# Patient Record
Sex: Male | Born: 1980
Health system: Southern US, Community
[De-identification: ages and names within clinical notes are randomized; demographics above are authoritative.]

## PROBLEM LIST (undated history)

## (undated) DIAGNOSIS — G4733 Obstructive sleep apnea (adult) (pediatric): Secondary | ICD-10-CM

## (undated) DIAGNOSIS — M1990 Unspecified osteoarthritis, unspecified site: Secondary | ICD-10-CM

## (undated) DIAGNOSIS — J302 Other seasonal allergic rhinitis: Secondary | ICD-10-CM

## (undated) DIAGNOSIS — K760 Fatty (change of) liver, not elsewhere classified: Secondary | ICD-10-CM

## (undated) DIAGNOSIS — M4310 Spondylolisthesis, site unspecified: Secondary | ICD-10-CM

## (undated) DIAGNOSIS — E739 Lactose intolerance, unspecified: Secondary | ICD-10-CM

## (undated) DIAGNOSIS — K219 Gastro-esophageal reflux disease without esophagitis: Secondary | ICD-10-CM

## (undated) DIAGNOSIS — M109 Gout, unspecified: Secondary | ICD-10-CM

## (undated) DIAGNOSIS — M2550 Pain in unspecified joint: Secondary | ICD-10-CM

## (undated) DIAGNOSIS — N529 Male erectile dysfunction, unspecified: Secondary | ICD-10-CM

## (undated) DIAGNOSIS — R112 Nausea with vomiting, unspecified: Secondary | ICD-10-CM

## (undated) DIAGNOSIS — Z9889 Other specified postprocedural states: Secondary | ICD-10-CM

## (undated) DIAGNOSIS — I10 Essential (primary) hypertension: Secondary | ICD-10-CM

## (undated) DIAGNOSIS — G4730 Sleep apnea, unspecified: Secondary | ICD-10-CM

## (undated) DIAGNOSIS — F909 Attention-deficit hyperactivity disorder, unspecified type: Secondary | ICD-10-CM

## (undated) DIAGNOSIS — F419 Anxiety disorder, unspecified: Secondary | ICD-10-CM

## (undated) DIAGNOSIS — M549 Dorsalgia, unspecified: Secondary | ICD-10-CM

## (undated) DIAGNOSIS — T8859XA Other complications of anesthesia, initial encounter: Secondary | ICD-10-CM

## (undated) HISTORY — DX: Essential (primary) hypertension: I10

## (undated) HISTORY — DX: Other seasonal allergic rhinitis: J30.2

## (undated) HISTORY — DX: Dorsalgia, unspecified: M54.9

## (undated) HISTORY — PX: WISDOM TOOTH EXTRACTION: SHX21

## (undated) HISTORY — DX: Fatty (change of) liver, not elsewhere classified: K76.0

## (undated) HISTORY — DX: Gout, unspecified: M10.9

## (undated) HISTORY — DX: Pain in unspecified joint: M25.50

## (undated) HISTORY — DX: Gastro-esophageal reflux disease without esophagitis: K21.9

## (undated) HISTORY — DX: Lactose intolerance, unspecified: E73.9

## (undated) HISTORY — PX: OTHER SURGICAL HISTORY: SHX169

## (undated) HISTORY — PX: KNEE SURGERY: SHX244

## (undated) HISTORY — PX: COLONOSCOPY: SHX174

## (undated) HISTORY — PX: ELBOW SURGERY: SHX618

## (undated) HISTORY — PX: VASECTOMY: SHX75

## (undated) HISTORY — DX: Obstructive sleep apnea (adult) (pediatric): G47.33

## (undated) HISTORY — DX: Attention-deficit hyperactivity disorder, unspecified type: F90.9

## (undated) HISTORY — DX: Sleep apnea, unspecified: G47.30

---

## 2013-06-09 ENCOUNTER — Ambulatory Visit (HOSPITAL_BASED_OUTPATIENT_CLINIC_OR_DEPARTMENT_OTHER): Payer: 59 | Attending: Family Medicine | Admitting: Radiology

## 2013-06-09 VITALS — Ht 72.0 in | Wt 220.0 lb

## 2013-06-09 DIAGNOSIS — G4710 Hypersomnia, unspecified: Secondary | ICD-10-CM | POA: Insufficient documentation

## 2013-06-09 DIAGNOSIS — G4733 Obstructive sleep apnea (adult) (pediatric): Secondary | ICD-10-CM

## 2013-06-09 DIAGNOSIS — G4730 Sleep apnea, unspecified: Principal | ICD-10-CM

## 2013-06-09 DIAGNOSIS — R0683 Snoring: Secondary | ICD-10-CM

## 2013-06-09 DIAGNOSIS — R5381 Other malaise: Secondary | ICD-10-CM

## 2013-06-09 DIAGNOSIS — R5383 Other fatigue: Secondary | ICD-10-CM

## 2013-06-14 DIAGNOSIS — R0989 Other specified symptoms and signs involving the circulatory and respiratory systems: Secondary | ICD-10-CM

## 2013-06-14 DIAGNOSIS — R5383 Other fatigue: Secondary | ICD-10-CM

## 2013-06-14 DIAGNOSIS — G4733 Obstructive sleep apnea (adult) (pediatric): Secondary | ICD-10-CM

## 2013-06-14 DIAGNOSIS — R0609 Other forms of dyspnea: Secondary | ICD-10-CM

## 2013-06-14 DIAGNOSIS — R5381 Other malaise: Secondary | ICD-10-CM

## 2013-06-14 NOTE — Sleep Study (Signed)
   NAME: Andrew Gray DATE OF BIRTH:  1980/10/28 MEDICAL RECORD NUMBER [id number]  LOCATION: [facility]  PHYSICIAN: YOUNG,CLINTON D  DATE OF STUDY: 06/09/2013  SLEEP STUDY TYPE: Nocturnal Polysomnogram               REFERRING PHYSICIAN: Donnie Coffin, MD  INDICATION FOR STUDY: Hypersomnia with sleep apnea  EPWORTH SLEEPINESS SCORE:   9/24 HEIGHT: 6' (182.9 cm)  WEIGHT: 220 lb (99.791 kg)    Body mass index is 29.83 kg/(m^2).  NECK SIZE: 18.5 in.  MEDICATIONS: Charted for review  SLEEP ARCHITECTURE: Total sleep time 333 minutes with sleep efficiency 78.3%, stage I was 10.8%, stage II 66.1%, stage III 0.5%, REM 22.7% of total sleep time. Sleep latency 14.5 minutes, REM latency 135.5 minutes, awake after sleep onset 66.5 minutes, arousal index 9.9. Bedtime medication: None. There was difficulty initiating and maintaining sleep.  RESPIRATORY DATA: Apnea hypopnea index (AHI) 4.3 per hour. 24 events were scored including 2 central apneas and 22 hypopneas. Events were more common while supine. REM AHI 8.7 per hour. There were not enough early events to meet protocol requirements for split CPAP titration.   OXYGEN DATA: Loud intermittent snoring with oxygen desaturation to a nadir of 89% and mean oxygen saturation through the study of 96.9% on room air.  CARDIAC DATA: Normal cardiac rhythm  MOVEMENT/PARASOMNIA: No significant movement disturbance. Bathroom x1  IMPRESSION/ RECOMMENDATION:   1) Nonspecific difficulty initiating and maintaining sleep until nearly 00:30 AM, without bedtime medication. 2) Occasional respiratory events with sleep disturbance, within normal limits. AHI 4.3 per hour (the normal range for adults is an AHI of 0-5 events per hour). Loud snoring with oxygen desaturation to a nadir at 89% and mean oxygen saturation through the study of 96.9% on room air.  Signed Baird Lyons M.D. Deneise Lever Diplomate, American Board of Sleep  Medicine  ELECTRONICALLY SIGNED ON:  06/14/2013, 2:40 PM [place] PH: [phone]   FX: [phone] [place]

## 2015-02-15 ENCOUNTER — Other Ambulatory Visit: Payer: Self-pay | Admitting: Sports Medicine

## 2015-02-15 DIAGNOSIS — M545 Low back pain, unspecified: Secondary | ICD-10-CM

## 2015-02-15 DIAGNOSIS — M4317 Spondylolisthesis, lumbosacral region: Secondary | ICD-10-CM | POA: Insufficient documentation

## 2015-02-16 ENCOUNTER — Other Ambulatory Visit: Payer: Self-pay | Admitting: Sports Medicine

## 2015-02-16 ENCOUNTER — Ambulatory Visit (INDEPENDENT_AMBULATORY_CARE_PROVIDER_SITE_OTHER): Payer: 59

## 2015-02-16 DIAGNOSIS — M545 Low back pain, unspecified: Secondary | ICD-10-CM

## 2015-02-16 DIAGNOSIS — M5136 Other intervertebral disc degeneration, lumbar region: Secondary | ICD-10-CM

## 2015-02-16 DIAGNOSIS — M109 Gout, unspecified: Secondary | ICD-10-CM | POA: Insufficient documentation

## 2015-02-16 DIAGNOSIS — M4317 Spondylolisthesis, lumbosacral region: Secondary | ICD-10-CM

## 2015-02-16 DIAGNOSIS — M10072 Idiopathic gout, left ankle and foot: Secondary | ICD-10-CM

## 2015-02-16 DIAGNOSIS — Z0000 Encounter for general adult medical examination without abnormal findings: Secondary | ICD-10-CM | POA: Insufficient documentation

## 2015-02-16 NOTE — Assessment & Plan Note (Signed)
X-ray show L5-S1 grade 1 spondylolisthesis with the appearance of a pars intra-articular defect, unilateral. Abdomen has been through greater than 6 weeks of physician directed rehabilitation, as well as oral medications. Considering right-sided L5 versus S1 radiculopathy we are going to obtain an MRI, he does have some lower thoracic degenerative disc disease somewhat would like to extend the L-spine MRI up to T10.

## 2015-02-17 ENCOUNTER — Encounter: Payer: Self-pay | Admitting: Sports Medicine

## 2015-02-18 ENCOUNTER — Ambulatory Visit (INDEPENDENT_AMBULATORY_CARE_PROVIDER_SITE_OTHER): Payer: 59 | Admitting: Physical Therapy

## 2015-02-18 ENCOUNTER — Encounter: Payer: Self-pay | Admitting: Physical Therapy

## 2015-02-18 DIAGNOSIS — M545 Low back pain, unspecified: Secondary | ICD-10-CM

## 2015-02-18 DIAGNOSIS — M6281 Muscle weakness (generalized): Secondary | ICD-10-CM

## 2015-02-18 DIAGNOSIS — R29898 Other symptoms and signs involving the musculoskeletal system: Secondary | ICD-10-CM

## 2015-02-18 NOTE — Patient Instructions (Addendum)
Pelvic Press   Place hands under belly between navel and pubic bone, palms up. Feel pressure on hands. Increase pressure on hands by pressing pelvis down. This is NOT a pelvic tilt. Hold __5_ seconds. Relax. Repeat _10__ times. Once a day  KNEE: Flexion - Prone   Hold pelvic press. Bend both knees. Raise heels toward buttocks. Do not raise hips. _5-10__ reps per set, __1_ sets per day, _7__ days per week  Hip Extension (Prone) - DON:T USE PILLOWS UNDER BELLY   Hold pelvic press  Lift left leg _3___ inches from floor, keeping knee locked. Repeat __5-10__ times per set. Do _1___ sets per session. Do _1___ sessions per day.  HIP: Extension / KNEE: Flexion - Prone    Hold pelvic press. Bend knee, raise leg up  10___ reps per set, _1__ sets per day, _7__ days per week  Axial Extension- Upper body sequence * always start with pelvic press    Lie on stomach with forehead resting on floor and arms at sides. Tuck chin in and raise head from floor without bending it up or down. Repeat __5- 10_ times per set. Do __1__ sets per session. Do _1___ sessions per day.  Progression:  Arms at side Arms in T shape Arms in W shape  Arms in M shape Arms in Y shape  Then perform bilat knees to chest for  30 sec or LTR.   [employer] [facility] [place], [place] [address]  [phone] (office) [phone] (fax)  Trigger Point Dry Needling  . What is Trigger Point Dry Needling (DN)? o DN is a physical therapy technique used to treat muscle pain and dysfunction. Specifically, DN helps deactivate muscle trigger points (muscle knots).  o A thin filiform needle is used to penetrate the skin and stimulate the underlying trigger point. The goal is for a local twitch response (LTR) to occur and for the trigger point to relax. No medication of any kind is injected during the procedure.   . What Does Trigger Point Dry Needling Feel Like?   o The procedure feels different for each individual patient. Some patients report that they do not actually feel the needle enter the skin and overall the process is not painful. Very mild bleeding may occur. However, many patients feel a deep cramping in the muscle in which the needle was inserted. This is the local twitch response.   Marland Kitchen How Will I feel after the treatment? o Soreness is normal, and the onset of soreness may not occur for a few hours. Typically this soreness does not last longer than two days.  o Bruising is uncommon, however; ice can be used to decrease any possible bruising.  o In rare cases feeling tired or nauseous after the treatment is normal. In addition, your symptoms may get worse before they get better, this period will typically not last longer than 24 hours.   . What Can I do After My Treatment? o Increase your hydration by drinking more water for the next 24 hours. o You may place ice or heat on the areas treated that have become sore, however, do not use heat on inflamed or bruised areas. Heat often brings more relief post needling. o You can continue your regular activities, but vigorous activity is not recommended initially after the treatment for 24 hours. o DN is best combined with other physical therapy such as strengthening, stretching, and other therapies.  o

## 2015-02-18 NOTE — Therapy (Addendum)
[place], [name], [address] Phone: [phone]   Fax:  [phone]  Physical Therapy Evaluation  Patient Details  Name: Andrew Gray MRN: [id number] Date of Birth: Sep 19, 1980 Referring Provider: Tandy Gaw  Encounter Date: 02/18/2015      PT End of Session - 02/18/15 1228    Visit Number 1   Number of Visits 4   Date for PT Re-Evaluation 03/18/15   PT Start Time 7169   PT Stop Time 1226   PT Time Calculation (min) 38 min   Activity Tolerance Patient tolerated treatment well      History reviewed. No pertinent past medical history.  History reviewed. No pertinent past surgical history.  There were no vitals filed for this visit.  Visit Diagnosis:  Weakness of back - Plan: PT plan of care cert/re-cert  Right-sided low back pain without sciatica - Plan: PT plan of care cert/re-cert      Subjective Assessment - 02/18/15 1152    Subjective Pt reports increasing low back pain 4-6 wks ago that is beginning to interfere with his walking for exercise, also having trouble with stairs   Diagnostic tests x-rays Pars defect and grade I spondolythesis   Patient Stated Goals walking without pain, mountain biking, running.    Currently in Pain? Yes   Pain Score 3   at worst 8/10    Pain Location Back   Pain Orientation Right   Pain Descriptors / Indicators Aching   Pain Radiating Towards Rt calf laterally    Pain Onset More than a month ago   Pain Frequency Intermittent   Aggravating Factors  prolonged walking, down hills, stairs   Pain Relieving Factors forward flexion            [facility] PT Assessment - 02/18/15 0001    Assessment   Medical Diagnosis spondylolisthesis L5-S1   Referring Provider Thekkekandam   Onset Date/Surgical Date 01/21/15   Prior Therapy none   Precautions   Precautions --  limit spinal extension   Balance Screen   Has the patient fallen in the past 6 months  No   Has the patient had a decrease in activity level because of a fear of falling?  No   Is the patient reluctant to leave their home because of a fear of falling?  No   Prior Function   Level of Independence Independent   Vocation Requirements MD   Posture/Postural Control   Posture/Postural Control Postural limitations   Postural Limitations Forward head;Rounded Shoulders   ROM / Strength   AROM / PROM / Strength AROM;Strength   AROM   Overall AROM Comments bilat LE's WNL   AROM Assessment Site Lumbar   Lumbar Flexion WNL   Lumbar Extension NA   Lumbar - Right Rotation WNL  some pain in back   Lumbar - Left Rotation WNL   Strength   Overall Strength Comments bilat LE's grossly WNL, Rt hip abduction 5-/5, multifidis Rt fair, Lt good (-)    Flexibility   Soft Tissue Assessment /Muscle Length --  Good hamstring flexibility   Palpation   Spinal mobility hypomobile L5-3 Rt UPA with some tenderness.    Palpation comment very tight in Rt lumbar paraspinals/multifidi                    [facility] Adult PT Treatment/Exercise - 02/18/15 0001    Exercises   Exercises Lumbar   Lumbar Exercises: Stretches  Double Knee to Chest Stretch 1 rep;20 seconds   Lower Trunk Rotation 5 reps   Lumbar Exercises: Prone   Other Prone Lumbar Exercises pelvic press routine          Trigger Point Dry Needling - 02/18/15 1246    Consent Given? Yes   Education Handout Provided Yes   Muscles Treated Lower Body --  lumbar 3-1, Rt multifidi, good twitch response.               PT Education - 02/18/15 1250    Education provided Yes   Education Details HEP    Person(s) Educated Patient   Methods Explanation;Demonstration;Handout   Comprehension Verbal cues required;Returned demonstration;Verbalized understanding             PT Long Term Goals - 02/18/15 1251    PT LONG TERM GOAL #1   Title I with HEP ( 03/18/15)    Time 4   Period Weeks   Status New   PT LONG TERM  GOAL #2   Title demo strong contraction bilat multifidi ( 03/18/15)   Time 4   Period Weeks   Status New   PT LONG TERM GOAL #3   Title report pain decrease =/> 75% with walking and on hills ( 03/18/15)    Time 4   Period Weeks   Status New        34 yo male presents with significant tightness in his lumbar paraspinals. Responded well to TDN with decreased Tightness and pain.  He also has deep core weakness and would benefit from PT to decrease pain, increase core Strength and return him to his prior level of function.  Plan - STW to lumbar paraspinals, review HEP and progress to work on [employer].  Jeral Pinch, PT 02/24/2015 2:05 PM           Problem List Patient Active Problem List   Diagnosis Date Noted  . Annual physical exam 02/16/2015  . Gout 02/16/2015  . Spondylolisthesis at L5-S1 level 02/15/2015    Jeral Pinch PT 02/18/2015, 4:47 PM  [facility] [place], [name], [address] Phone: [phone]   Fax:  [phone]  Name: Andrew Gray MRN: [id number] Date of Birth: 10/28/1980

## 2015-02-21 ENCOUNTER — Ambulatory Visit (INDEPENDENT_AMBULATORY_CARE_PROVIDER_SITE_OTHER): Payer: 59

## 2015-02-21 DIAGNOSIS — M4317 Spondylolisthesis, lumbosacral region: Secondary | ICD-10-CM

## 2015-02-21 DIAGNOSIS — M5126 Other intervertebral disc displacement, lumbar region: Secondary | ICD-10-CM | POA: Diagnosis not present

## 2015-02-23 ENCOUNTER — Ambulatory Visit (INDEPENDENT_AMBULATORY_CARE_PROVIDER_SITE_OTHER): Payer: 59 | Admitting: Sports Medicine

## 2015-02-23 VITALS — BP 145/94 | HR 74 | Wt 277.0 lb

## 2015-02-23 DIAGNOSIS — E785 Hyperlipidemia, unspecified: Secondary | ICD-10-CM

## 2015-02-23 DIAGNOSIS — Z0000 Encounter for general adult medical examination without abnormal findings: Secondary | ICD-10-CM

## 2015-02-23 DIAGNOSIS — I10 Essential (primary) hypertension: Secondary | ICD-10-CM

## 2015-02-23 DIAGNOSIS — R740 Nonspecific elevation of levels of transaminase and lactic acid dehydrogenase [LDH]: Secondary | ICD-10-CM | POA: Diagnosis not present

## 2015-02-23 DIAGNOSIS — R7401 Elevation of levels of liver transaminase levels: Secondary | ICD-10-CM | POA: Insufficient documentation

## 2015-02-23 DIAGNOSIS — R635 Abnormal weight gain: Secondary | ICD-10-CM | POA: Diagnosis not present

## 2015-02-23 LAB — COMPREHENSIVE METABOLIC PANEL
AST: 51 U/L — ABNORMAL HIGH (ref 10–40)
BUN: 15 mg/dL (ref 7–25)
CO2: 27 mmol/L (ref 20–31)
Chloride: 101 mmol/L (ref 98–110)
Creat: 0.75 mg/dL (ref 0.60–1.35)
Glucose, Bld: 90 mg/dL (ref 65–99)
Sodium: 134 mmol/L — ABNORMAL LOW (ref 135–146)
Total Bilirubin: 0.8 mg/dL (ref 0.2–1.2)

## 2015-02-23 LAB — COMPREHENSIVE METABOLIC PANEL WITH GFR
ALT: 100 U/L — ABNORMAL HIGH (ref 9–46)
Albumin: 4.7 g/dL (ref 3.6–5.1)
Alkaline Phosphatase: 50 U/L (ref 40–115)
Calcium: 9.2 mg/dL (ref 8.6–10.3)
Potassium: 4.4 mmol/L (ref 3.5–5.3)
Total Protein: 7.4 g/dL (ref 6.1–8.1)

## 2015-02-23 LAB — CBC
HCT: 43.8 % (ref 39.0–52.0)
Hemoglobin: 15.5 g/dL (ref 13.0–17.0)
MCH: 29.9 pg (ref 26.0–34.0)
MCHC: 35.4 g/dL (ref 30.0–36.0)
MCV: 84.4 fL (ref 78.0–100.0)
MPV: 10.8 fL (ref 8.6–12.4)
Platelets: 219 10*3/uL (ref 150–400)
RBC: 5.19 MIL/uL (ref 4.22–5.81)
RDW: 13.8 % (ref 11.5–15.5)
WBC: 4.8 10*3/uL (ref 4.0–10.5)

## 2015-02-23 LAB — LIPID PANEL
Cholesterol: 204 mg/dL — ABNORMAL HIGH (ref 125–200)
HDL: 40 mg/dL (ref >=40)
LDL Cholesterol: 131 mg/dL — ABNORMAL HIGH (ref <130)
Total CHOL/HDL Ratio: 5.1 Ratio — ABNORMAL HIGH (ref <=5.0)
Triglycerides: 165 mg/dL — ABNORMAL HIGH (ref <150)
VLDL: 33 mg/dL — ABNORMAL HIGH (ref <30)

## 2015-02-23 LAB — VITAMIN D 25 HYDROXY (VIT D DEFICIENCY, FRACTURES): Vit D, 25-Hydroxy: 25 ng/mL — ABNORMAL LOW (ref 30–100)

## 2015-02-23 LAB — HEMOGLOBIN A1C
Hgb A1c MFr Bld: 4.9 % (ref <5.7)
Mean Plasma Glucose: 94 mg/dL (ref <117)

## 2015-02-23 LAB — URIC ACID: Uric Acid, Serum: 6.2 mg/dL (ref 4.0–7.8)

## 2015-02-23 LAB — TSH: TSH: 1.482 u[IU]/mL (ref 0.350–4.500)

## 2015-02-23 MED ORDER — PHENTERMINE HCL 37.5 MG PO TABS: ORAL_TABLET | ORAL | Status: DC

## 2015-02-23 MED ORDER — VITAMIN D (ERGOCALCIFEROL) 1.25 MG (50000 UNIT) PO CAPS: 50000.0000 [IU] | ORAL_CAPSULE | ORAL | Status: DC

## 2015-02-23 MED ORDER — LISINOPRIL 20 MG PO TABS: 20.0000 mg | ORAL_TABLET | Freq: Every day | ORAL | Status: DC

## 2015-02-23 NOTE — Addendum Note (Signed)
Addended by: Silverio Decamp on: 02/23/2015 04:19 PM   Modules accepted: Orders

## 2015-02-23 NOTE — Assessment & Plan Note (Signed)
Suspect that this is secondary to steatohepatitis, ordering ultrasound and we will follow this up in a couple of months.

## 2015-02-23 NOTE — Assessment & Plan Note (Signed)
Increasing lisinopril 20 mg daily.  Recheck in 10 days with a BMET

## 2015-02-23 NOTE — Assessment & Plan Note (Signed)
We will first work with aggressive weight loss before considering statin medication.

## 2015-02-23 NOTE — Progress Notes (Signed)
   Subjective:    Patient ID: Andrew Gray, male    DOB: June 22, 1980, 34 y.o.   MRN: [id number]  HPI  Patient is here today for blood pressure and weight check. Denies trouble sleeping, palpitations, or medication problems.   Review of Systems     Objective:   Physical Exam        Assessment & Plan:  This is patients initial visit for abnormal weight gain. Patient advised to schedule a follow up nurse visit in 30 days.

## 2015-02-23 NOTE — Assessment & Plan Note (Signed)
Starting phentermine, return monthly for weight checks and refills. 

## 2015-02-23 NOTE — Addendum Note (Signed)
Addended by: Silverio Decamp on: 02/23/2015 04:24 PM   Modules accepted: Orders

## 2015-02-23 NOTE — Assessment & Plan Note (Signed)
Dr. Georgina Snell will ultimately follow up for a new patient physical/wellness visit.

## 2015-02-25 ENCOUNTER — Ambulatory Visit (INDEPENDENT_AMBULATORY_CARE_PROVIDER_SITE_OTHER): Payer: 59 | Admitting: Physical Therapy

## 2015-02-25 DIAGNOSIS — R29898 Other symptoms and signs involving the musculoskeletal system: Secondary | ICD-10-CM

## 2015-02-25 DIAGNOSIS — M545 Low back pain, unspecified: Secondary | ICD-10-CM

## 2015-02-25 DIAGNOSIS — M6281 Muscle weakness (generalized): Secondary | ICD-10-CM

## 2015-02-25 NOTE — Patient Instructions (Signed)
  Abdominal Bracing With Pelvic Floor (Hook-Lying)   With neutral spine, tighten pelvic floor and abdominals. Hold 10 seconds. Repeat __10_ times. Do _1__ times a day.   Knee to Chest: Transverse Plane Stability  Tighten abdominals.  Bring one knee up, then return. Be sure pelvis does not roll side to side. Keep pelvis still. Lift knee __10_ times each leg. Restabilize pelvis. Repeat with other leg. Do _1-2__ sets, _1__ times per day.   Hip External Rotation With Pillow: Transverse Plane Stability   KEEP BOTH KNEES BENT.  Slowly roll bent knee out. Be sure pelvis does not rotate. Do _10__ times. Restabilize pelvis. Repeat with other leg. Do _1-2__ sets, _1__ times per day.  Heel Slide: 4-10 Inches - Transverse Plane Stability  Tighten abdominals.  Slide heel 4 inches down. Be sure pelvis does not rotate. Do _10__ times. Restabilize pelvis. Repeat with other leg. Do __1_ sets, _1__ times per day.   [facility] [place], [place] [address]  [phone] (office) [phone] (fax)

## 2015-02-25 NOTE — Therapy (Signed)
[place], [name], [address] Phone: [phone]   Fax:  [phone]  Physical Therapy Treatment  Patient Details  Name: Andrew Gray MRN: [id number] Date of Birth: 01/03/1981 Referring Provider: Tandy Gaw  Encounter Date: 02/25/2015      PT End of Session - 02/25/15 1332    Visit Number 2   Number of Visits 4   Date for PT Re-Evaluation 03/18/15   PT Start Time 1330   PT Stop Time 1432   PT Time Calculation (min) 62 min      No past medical history on file.  No past surgical history on file.  There were no vitals filed for this visit.  Visit Diagnosis:  Weakness of back  Right-sided low back pain without sciatica      Subjective Assessment - 02/25/15 1437    Subjective Pt reports he did not notice a big difference in LB after dry needling session.  Continues with Rt LBP with SLS while donning clothes in morning, as well as walking up/down hills.  Has been performing HEP daily.    Diagnostic tests x-rays Pars defect and grade I spondolythesis   Currently in Pain? Yes   Pain Score 3   up to 7 with certain motions   Pain Location Back   Pain Orientation Right   Aggravating Factors  prolonged walking, down hills, stairs   Pain Relieving Factors forward flexion             [facility] PT Assessment - 02/25/15 0001    Assessment   Medical Diagnosis spondylolisthesis L5-S1           [facility] Adult PT Treatment/Exercise - 02/25/15 0001    Exercises   Exercises Lumbar   Lumbar Exercises: Stretches   Passive Hamstring Stretch 1 rep;60 seconds   Double Knee to Chest Stretch 20 seconds;2 reps   Lower Trunk Rotation 5 reps  2 sets   Piriformis Stretch 2 reps;30 seconds  each leg   Lumbar Exercises: Standing   Other Standing Lumbar Exercises Anti-rotation arms out and in from core using green / blue band x 3 reps.  Stopped due pain at injury site.    Lumbar Exercises: Supine   Ab  Set 5 reps  10 sec   Clam 10 reps  with ab set, each leg   Heel Slides 10 reps  ab set, each leg   Heel Slides Limitations some pain initially  with knee extension on RLE, encouraged to stay within pain free zone.    Bent Knee Raise 20 reps  each leg, with ab set    Other Supine Lumbar Exercises Table top with heel taps x 8 reps (difficult)   Lumbar Exercises: Prone   Other Prone Lumbar Exercises Pelvic press x 5 sec hold x 10 reps;  then with unilateral knee flexion x 10 reps x 2 sets; then with 10 reps of hip ext. Required some VC for form.    Lumbar Exercises: Quadruped   Other Quadruped Lumbar Exercises High kneeling: ball roll out core exercise with red ball x 8 reps (challenging)     Modalities   Modalities Moist Heat;Electrical Stimulation   Moist Heat Therapy   Number Minutes Moist Heat 15 Minutes   Moist Heat Location Lumbar Spine   Electrical Stimulation   Electrical Stimulation Location Rt lumbar paraspinals/ SI joint   Electrical Stimulation Action IFC   Electrical Stimulation Parameters to tolerance    Electrical Stimulation  Goals Pain   Manual Therapy   Manual Therapy Soft tissue mobilization   Soft tissue mobilization to bilat glutes/ hip rotators; repeated with passive IR/ER of RLE.                 PT Education - 02/25/15 1440    Education provided Yes   Education Details HEP- added trans ab series    Person(s) Educated Patient   Methods Explanation;Handout;Demonstration   Comprehension Returned demonstration;Verbalized understanding             PT Long Term Goals - 02/18/15 1251    PT LONG TERM GOAL #1   Title I with HEP ( 03/18/15)    Time 4   Period Weeks   Status New   PT LONG TERM GOAL #2   Title demo strong contraction bilat multifidi ( 03/18/15)   Time 4   Period Weeks   Status New   PT LONG TERM GOAL #3   Title report pain decrease =/> 75% with walking and on hills ( 03/18/15)    Time 4   Period Weeks   Status New                Plan - 02/25/15 1440    Clinical Impression Statement Pt tolerated all new exercises without increase in LBP, except anti-rotation core exercise - stopped after 3 reps due to mild pain. Pelvic press series and trans abdominal exercises were a good challenge; somewhat difficult to keep Rt mulitifidus engaged.  Pt reported decrease of pain with use of estim/MHP at end of session.  Progressing towards goals.    PT Frequency 1x / week   PT Duration 4 weeks   Consulted and Agree with Plan of Care Patient        Problem List Patient Active Problem List   Diagnosis Date Noted  . Abnormal weight gain 02/23/2015  . Hyperlipidemia 02/23/2015  . Transaminitis 02/23/2015  . Essential hypertension, benign 02/23/2015  . Annual physical exam 02/16/2015  . Gout 02/16/2015  . Spondylolisthesis at L5-S1 level 02/15/2015   Kerin Perna, PTA 02/25/2015 2:45 PM  [place], [name], [address] Phone: [phone]   Fax:  [phone]  Name: Andrew Gray MRN: [id number] Date of Birth: [birth year]

## 2015-03-02 ENCOUNTER — Encounter: Payer: Self-pay | Admitting: Physical Therapy

## 2015-03-02 ENCOUNTER — Ambulatory Visit (INDEPENDENT_AMBULATORY_CARE_PROVIDER_SITE_OTHER): Payer: 59 | Admitting: Physical Therapy

## 2015-03-02 DIAGNOSIS — M6281 Muscle weakness (generalized): Secondary | ICD-10-CM

## 2015-03-02 DIAGNOSIS — R29898 Other symptoms and signs involving the musculoskeletal system: Secondary | ICD-10-CM

## 2015-03-02 DIAGNOSIS — M545 Low back pain, unspecified: Secondary | ICD-10-CM

## 2015-03-02 NOTE — Therapy (Signed)
[place], [name], [address] Phone: [phone]   Fax:  [phone]  Physical Therapy Treatment  Patient Details  Name: Andrew Gray MRN: [id number] Date of Birth: 08/02/1980 Referring Provider: Tandy Gaw  Encounter Date: 03/02/2015      PT End of Session - 03/02/15 1449    Visit Number 3   Number of Visits 4   Date for PT Re-Evaluation 03/18/15   PT Start Time 1856   PT Stop Time 3149   PT Time Calculation (min) 55 min      History reviewed. No pertinent past medical history.  History reviewed. No pertinent past surgical history.  There were no vitals filed for this visit.  Visit Diagnosis:  Weakness of back  Right-sided low back pain without sciatica      Subjective Assessment - 03/02/15 1403    Subjective Able to take two long walks this weekend, Pain in the Rt lateral LE is worse.    Currently in Pain? Yes   Pain Score 3    Pain Location Back   Pain Orientation Right   Pain Descriptors / Indicators Aching   Pain Onset More than a month ago   Pain Frequency Intermittent   Aggravating Factors  prolonged walking   Pain Relieving Factors lying down and engaging core.                          [facility] Adult PT Treatment/Exercise - 03/02/15 0001    Lumbar Exercises: Supine   Ab Set 5 reps   Heel Slides 10 reps  with abdominal stabilizer   Bent Knee Raise 15 reps   Bridge 10 reps;5 seconds  with feet on ball   Modalities   Modalities Moist Heat;Electrical Stimulation   Moist Heat Therapy   Number Minutes Moist Heat 15 Minutes   Moist Heat Location Lumbar Spine   Electrical Stimulation   Electrical Stimulation Location Rt lumbar paraspinals/ SI joint   Electrical Stimulation Action IFC   Electrical Stimulation Parameters to tolerance   Electrical Stimulation Goals Pain   Manual Therapy   Manual Therapy Joint mobilization   Joint Mobilization grade II  lumbar CPA/UPA          Trigger Point Dry Needling - 03/02/15 1448    Consent Given? Yes   Muscles Treated Upper Body Quadratus Lumborum  Rt Good twitch response                   PT Long Term Goals - 03/02/15 1451    PT LONG TERM GOAL #1   Title I with HEP ( 03/18/15)    Status On-going   PT LONG TERM GOAL #2   Title demo strong contraction bilat multifidi ( 03/18/15)   Status On-going   PT LONG TERM GOAL #3   Title report pain decrease =/> 75% with walking and on hills ( 03/18/15)    Status On-going               Plan - 03/02/15 1456    Clinical Impression Statement Pt with increased tightness in the Rt QL, had a release with TDN. core is improving in strength.    Pt will benefit from skilled therapeutic intervention in order to improve on the following deficits Decreased strength;Impaired flexibility;Pain;Hypomobility;Increased muscle spasms   Rehab Potential Good   PT Frequency 1x / week   PT Duration 4 weeks   PT Treatment/Interventions  Manual techniques;Therapeutic exercise;Moist Heat;Dry needling;Cryotherapy;Electrical Stimulation;Patient/family education;Passive range of motion;Ultrasound;Traction   PT Next Visit Plan progress HEP   Consulted and Agree with Plan of Care Patient        Problem List Patient Active Problem List   Diagnosis Date Noted  . Abnormal weight gain 02/23/2015  . Hyperlipidemia 02/23/2015  . Transaminitis 02/23/2015  . Essential hypertension, benign 02/23/2015  . Annual physical exam 02/16/2015  . Gout 02/16/2015  . Spondylolisthesis at L5-S1 level 02/15/2015    Jeral Pinch PT 03/02/2015, 3:04 PM  [facility] [place], [name], [address] Phone: [phone]   Fax:  [phone]  Name: Andrew Gray MRN: [id number] Date of Birth: 06-Jul-1980

## 2015-03-03 LAB — COMPREHENSIVE METABOLIC PANEL WITH GFR
ALT: 111 U/L — ABNORMAL HIGH (ref 9–46)
AST: 50 U/L — ABNORMAL HIGH (ref 10–40)
Alkaline Phosphatase: 49 U/L (ref 40–115)
BUN: 14 mg/dL (ref 7–25)
CO2: 30 mmol/L (ref 20–31)
Glucose, Bld: 79 mg/dL (ref 65–99)
Potassium: 4.3 mmol/L (ref 3.5–5.3)

## 2015-03-03 LAB — COMPREHENSIVE METABOLIC PANEL
Albumin: 4.7 g/dL (ref 3.6–5.1)
Calcium: 9.8 mg/dL (ref 8.6–10.3)
Chloride: 100 mmol/L (ref 98–110)
Creat: 0.89 mg/dL (ref 0.60–1.35)
Sodium: 138 mmol/L (ref 135–146)
Total Bilirubin: 0.8 mg/dL (ref 0.2–1.2)
Total Protein: 7.5 g/dL (ref 6.1–8.1)

## 2015-03-03 NOTE — Addendum Note (Signed)
Addended by: Silverio Decamp on: 03/03/2015 12:59 PM   Modules accepted: Orders

## 2015-03-04 ENCOUNTER — Ambulatory Visit (INDEPENDENT_AMBULATORY_CARE_PROVIDER_SITE_OTHER): Payer: 59 | Admitting: Sports Medicine

## 2015-03-04 ENCOUNTER — Other Ambulatory Visit: Payer: Self-pay

## 2015-03-04 ENCOUNTER — Encounter: Payer: Self-pay | Admitting: Sports Medicine

## 2015-03-04 ENCOUNTER — Ambulatory Visit (INDEPENDENT_AMBULATORY_CARE_PROVIDER_SITE_OTHER): Payer: 59

## 2015-03-04 VITALS — BP 131/71 | HR 82 | Wt 276.0 lb

## 2015-03-04 DIAGNOSIS — R7401 Elevation of levels of liver transaminase levels: Secondary | ICD-10-CM

## 2015-03-04 DIAGNOSIS — R740 Nonspecific elevation of levels of transaminase and lactic acid dehydrogenase [LDH]: Principal | ICD-10-CM

## 2015-03-04 DIAGNOSIS — K760 Fatty (change of) liver, not elsewhere classified: Secondary | ICD-10-CM

## 2015-03-04 DIAGNOSIS — R635 Abnormal weight gain: Secondary | ICD-10-CM

## 2015-03-04 DIAGNOSIS — Z0000 Encounter for general adult medical examination without abnormal findings: Secondary | ICD-10-CM

## 2015-03-04 NOTE — Assessment & Plan Note (Signed)
Dr. Georgina Snell starting phentermine, we will see him back in one month as a nurse visit for a weight check. We will do full dose for 6 months and then half dose for another 6 months.

## 2015-03-04 NOTE — Progress Notes (Signed)
  Subjective:    CC: Establish care.   HPI:  Transaminitis: Awaiting ultrasound results  Hypertension: Continue lisinopril  Gout: Stable on [place]  Abdomen weight gain: Needs to start phentermine  L5-S1 spondylolisthesis: Currently doing physical therapy  Past medical history, Surgical history, Family history not pertinant except as noted below, Social history, Allergies, and medications have been entered into the medical record, reviewed, and no changes needed.   Review of Systems: No headache, visual changes, nausea, vomiting, diarrhea, constipation, dizziness, abdominal pain, skin rash, fevers, chills, night sweats, swollen lymph nodes, weight loss, chest pain, body aches, joint swelling, muscle aches, shortness of breath, mood changes, visual or auditory hallucinations.  Objective:    General: Well Developed, well nourished, and in no acute distress.  Neuro: Alert and oriented x3, extra-ocular muscles intact, sensation grossly intact. Cranial nerves II through XII are intact, motor, sensory, and coordinative functions are all intact. HEENT: Normocephalic, atraumatic, pupils equal round reactive to light, neck supple, no masses, no lymphadenopathy, thyroid nonpalpable. Oropharynx, nasopharynx, external ear canals are unremarkable. Skin: Warm and dry, no rashes noted.  Cardiac: Regular rate and rhythm, no murmurs rubs or gallops.  Respiratory: Clear to auscultation bilaterally. Not using accessory muscles, speaking in full sentences.  Abdominal: Soft, nontender, nondistended, positive bowel sounds, no masses, no organomegaly.  Musculoskeletal: Shoulder, elbow, wrist, hip, knee, ankle stable, and with full range of motion.  Impression and Recommendations:    The patient was counselled, risk factors were discussed, anticipatory guidance given.

## 2015-03-04 NOTE — Assessment & Plan Note (Signed)
Unremarkable physical exam.

## 2015-03-05 ENCOUNTER — Other Ambulatory Visit: Payer: Self-pay

## 2015-03-17 ENCOUNTER — Other Ambulatory Visit: Payer: Self-pay | Admitting: Sports Medicine

## 2015-03-17 MED ORDER — DOXYCYCLINE HYCLATE 100 MG PO TABS: 100.0000 mg | ORAL_TABLET | Freq: Two times a day (BID) | ORAL | Status: AC

## 2015-03-18 ENCOUNTER — Ambulatory Visit (INDEPENDENT_AMBULATORY_CARE_PROVIDER_SITE_OTHER): Payer: 59 | Admitting: Physical Therapy

## 2015-03-18 DIAGNOSIS — M6281 Muscle weakness (generalized): Secondary | ICD-10-CM | POA: Diagnosis not present

## 2015-03-18 DIAGNOSIS — R29898 Other symptoms and signs involving the musculoskeletal system: Secondary | ICD-10-CM

## 2015-03-18 DIAGNOSIS — M545 Low back pain, unspecified: Secondary | ICD-10-CM

## 2015-03-18 NOTE — Therapy (Signed)
[place], [name], [address] Phone: [phone]   Fax:  [phone]  Physical Therapy Treatment  Patient Details  Name: Andrew Gray MRN: [id number] Date of Birth: [birth year] Referring Provider: Tandy Gaw  Encounter Date: 03/18/2015      PT End of Session - 03/18/15 1404    Visit Number 4   Number of Visits 4   Date for PT Re-Evaluation 03/18/15   PT Start Time 1332   PT Stop Time 1403   PT Time Calculation (min) 31 min   Activity Tolerance Patient tolerated treatment well      No past medical history on file.  No past surgical history on file.  There were no vitals filed for this visit.  Visit Diagnosis:  Weakness of back  Right-sided low back pain without sciatica      Subjective Assessment - 03/18/15 1334    Subjective Pt reports the low back is better however now having some Rt piriformis/lateral hip pain with prolonged standing. No pain with sitting or lying down.    Currently in Pain? No/denies            [facility] PT Assessment - 03/18/15 0001    Assessment   Medical Diagnosis spondylolisthesis L5-S1   Onset Date/Surgical Date 01/21/15   Observation/Other Assessments   Focus on Therapeutic Outcomes (FOTO)  34% limited                     [facility] Adult PT Treatment/Exercise - 03/18/15 0001    Lumbar Exercises: Standing   Other Standing Lumbar Exercises SLS Rt with FWD leans keeping pelvis level   Lumbar Exercises: Supine   Bridge --  with marching, leg ext and SLR keeping pelvis level   Other Supine Lumbar Exercises pilates 100's imtermediate, table top with heel taps,    Lumbar Exercises: Sidelying   Hip Abduction --  VC's for form   Lumbar Exercises: Prone   Other Prone Lumbar Exercises lower body lifts with heel taps LE in ER   Modalities   Modalities Iontophoresis   Iontophoresis   Type of Iontophoresis Dexamethasone   Location Rt hip greater  troc   Dose 1.0cc   Time 6 hr patch                PT Education - 03/18/15 1412    Education provided Yes   Education Details HEP core progression   Person(s) Educated Patient   Methods Explanation;Handout   Comprehension Returned demonstration;Verbalized understanding             PT Long Term Goals - 03/18/15 1412    PT LONG TERM GOAL #1   Title I with HEP ( 03/18/15)    Status On-going   PT LONG TERM GOAL #2   Title demo strong contraction bilat multifidi ( 03/18/15)   Status Achieved   PT LONG TERM GOAL #3   Title report pain decrease =/> 75% with walking and on hills ( 03/18/15)    Status On-going  will be hiking this weekend               Plan - 03/18/15 1404    Clinical Impression Statement Pt has done very well with his LBP, he has developed a new pain the the lateral Rt hip around the greater troc.  Would benefit from some iontophoresis with dexamethasone to settle this down. Also has some tightness in his Rt upper hamstring.  FOTO  score has improved from 45% limited to 34% and part of this is due to his new pain with standing.    Pt will benefit from skilled therapeutic intervention in order to improve on the following deficits Decreased strength;Impaired flexibility;Pain;Hypomobility;Increased muscle spasms   Rehab Potential Good   PT Frequency 1x / week   PT Duration --  5 weeks   PT Treatment/Interventions Manual techniques;Therapeutic exercise;Moist Heat;Dry needling;Cryotherapy;Electrical Stimulation;Patient/family education;Passive range of motion;Ultrasound;Traction;Iontophoresis 4mg /ml Dexamethasone   PT Next Visit Plan See how he does after hiking this weekend, assess ionto repsonse and for D/C     Consulted and Agree with Plan of Care Patient        Problem List Patient Active Problem List   Diagnosis Date Noted  . Abnormal weight gain 02/23/2015  . Hyperlipidemia 02/23/2015  . Transaminitis 02/23/2015  . Essential hypertension,  benign 02/23/2015  . Annual physical exam 02/16/2015  . Gout 02/16/2015  . Spondylolisthesis at L5-S1 level 02/15/2015    Jeral Pinch PT 03/18/2015, 2:16 PM  [facility] [place], [name], [address] Phone: [phone]   Fax:  [phone]  Name: Andrew Gray MRN: [id number] Date of Birth: Jan 10, 1981

## 2015-03-18 NOTE — Patient Instructions (Addendum)
Toe Touch      [place] [phone]    Lie on back, legs folded to chest, arms by sides. Exhale, lowering leg to just touch toes to mat. Inhale, returning knee to chest. Keep abdominals flat, navel to spine. Repeat _10-20___ times, alternating legs. Do __1__ sessions per day.    The Hundred    Lie on back, legs up, bent, arms toward ceiling. Exhale, pressing arms down to sides, curling up head and upper torso. Hold. Pump arms in small flutters up and down. __5__ pumps per inhale, _5___ pumps per exhale. Repeat __10__ times. Do ___1_ sessions per day. Level I  = keep feet down.     Prone Leg Beats    Lie on stomach, forehead on hands. Exhale, raising legs, slightly turned out. Inhale, beating heels together for ____ small beats. Exhale, lowering legs. Repeat ____ times. Do ____ sessions per day.  Bridging: with Straight Leg Raise    With legs bent, lift buttocks ____ inches from floor. Then slowly extend right knee, keeping stomach tight. Repeat ____ times per set. Do ____ sets per session. Do ____ sessions per day. Stage I = marching, stage II as in picture, stage III keep leg  Straight and tap heel down.  http://orth.exer.us/1104   Copyright  VHI. All rights reserved.

## 2015-03-25 ENCOUNTER — Ambulatory Visit (INDEPENDENT_AMBULATORY_CARE_PROVIDER_SITE_OTHER): Payer: 59 | Admitting: Physical Therapy

## 2015-03-25 DIAGNOSIS — M6281 Muscle weakness (generalized): Secondary | ICD-10-CM

## 2015-03-25 DIAGNOSIS — M545 Low back pain, unspecified: Secondary | ICD-10-CM

## 2015-03-25 DIAGNOSIS — R29898 Other symptoms and signs involving the musculoskeletal system: Secondary | ICD-10-CM

## 2015-03-25 NOTE — Therapy (Addendum)
[place], [name], [address] Phone: [phone]   Fax:  [phone]  Physical Therapy Treatment  Patient Details  Name: Andrew Gray MRN: [id number] Date of Birth: [birth year] Referring Provider: Dr. Pollyann Glen  Encounter Date: 03/25/2015      PT End of Session - 03/25/15 1356    Visit Number 5   Date for PT Re-Evaluation 03/18/15   PT Start Time 1330   PT Stop Time 1357   PT Time Calculation (min) 27 min   Activity Tolerance Patient tolerated treatment well      No past medical history on file.  No past surgical history on file.  There were no vitals filed for this visit.  Visit Diagnosis:  Weakness of back  Right-sided low back pain without sciatica          [facility] PT Assessment - 03/25/15 0001    Assessment   Medical Diagnosis spondylolisthesis L5-S1   Referring Provider Dr. Pollyann Glen   Onset Date/Surgical Date 01/21/15          [facility] Adult PT Treatment/Exercise - 03/25/15 0001    Lumbar Exercises: Supine   Other Supine Lumbar Exercises bridge with legs on green therapy ball x 10; repeated with hamstring curl x 5 reps.     Lumbar Exercises: Sidelying   Other Sidelying Lumbar Exercises Side plank (on elbow) raising and lowering hips x 5 reps each side.    Lumbar Exercises: Prone   Other Prone Lumbar Exercises Pelvic press x 5 reps (to check form and strong contraction);  Plank variations: low to/from high plank x 5 reps, high plank with LE lifts x5 each leg, high plank with knees kissing table then returning to extension.    Other Prone Lumbar Exercises Therapy ball walk outs x 5 reps, repeated with knee tucks of legs on ball x 4 reps.    Modalities   Modalities Iontophoresis   Iontophoresis   Type of Iontophoresis Dexamethasone   Location Rt hip greater trochanter   Dose 1.0cc    Time 6 hr patch   Manual Therapy   Manual Therapy Soft tissue mobilization   Soft  tissue mobilization to deep rotators (obturator externus) of RLE, with active ER/IR of hip with pt in prone, also soft tissue mobiliation to glute max attachment/ proximal ITB.                 PT Education - 03/25/15 1404    Education provided Yes   Education Details HEP- added advanced core exercises including variations on plank.    Person(s) Educated Patient   Methods Explanation;Demonstration;Verbal cues   Comprehension Verbalized understanding;Returned demonstration             PT Long Term Goals - 03/25/15 1402    PT LONG TERM GOAL #1   Title I with HEP ( 03/18/15)    Time 4   Period Weeks   Status Achieved   PT LONG TERM GOAL #2   Title demo strong contraction bilat multifidi ( 03/18/15)   Time 4   Period Weeks   Status Achieved   PT LONG TERM GOAL #3   Title report pain decrease =/> 75% with walking and on hills ( 03/18/15)    Time 4   Period Weeks   Status Achieved               Plan - 03/25/15 1400    Clinical Impression Statement Pt tolerated advanced core exercises  without increase/production of pain.  Pt was point tender in Rt deep hip rotators and Rt glute attachment with manual therapy; reduced with manual therapy. Pt reports he had positive response to last ionto treatment.  Pt has met his goals and is satisfied with current level of function.  Pt requests to d/c.    Pt will benefit from skilled therapeutic intervention in order to improve on the following deficits Decreased strength;Impaired flexibility;Pain;Hypomobility;Increased muscle spasms   Rehab Potential Good   PT Frequency 1x / week   PT Duration --  5 wks   PT Treatment/Interventions Manual techniques;Therapeutic exercise;Moist Heat;Dry needling;Cryotherapy;Electrical Stimulation;Patient/family education;Passive range of motion;Ultrasound;Traction;Iontophoresis 4mg /ml Dexamethasone   PT Next Visit Plan Spoke to supervising PT regarding pt's progress and his desire to d/c to HEP.   Will d/c at this time.    Consulted and Agree with Plan of Care Patient        Problem List Patient Active Problem List   Diagnosis Date Noted  . Abnormal weight gain 02/23/2015  . Hyperlipidemia 02/23/2015  . Transaminitis 02/23/2015  . Essential hypertension, benign 02/23/2015  . Annual physical exam 02/16/2015  . Gout 02/16/2015  . Spondylolisthesis at L5-S1 level 02/15/2015   Kerin Perna, PTA 03/25/2015 2:13 PM  [facility] [place], [name], [address] Phone: [phone]   Fax:  [phone]  Name: Andrew Gray MRN: [id number] Date of Birth: 03-Jun-1980  PHYSICAL THERAPY DISCHARGE SUMMARY  Visits from Start of Care: 5  Current functional level related to goals / functional outcomes: See above  Remaining deficits: none   Education / Equipment: HEP Plan: Patient agrees to discharge.  Patient goals were met. Patient is being discharged due to meeting the stated rehab goals.  ?????       Jeral Pinch, PT 04/19/2015 8:49 AM

## 2015-03-28 ENCOUNTER — Ambulatory Visit (INDEPENDENT_AMBULATORY_CARE_PROVIDER_SITE_OTHER): Payer: 59 | Admitting: Sports Medicine

## 2015-03-28 VITALS — BP 133/81 | HR 68 | Wt 253.0 lb

## 2015-03-28 DIAGNOSIS — R635 Abnormal weight gain: Secondary | ICD-10-CM | POA: Diagnosis not present

## 2015-03-28 MED ORDER — PHENTERMINE HCL 37.5 MG PO TABS: ORAL_TABLET | ORAL | Status: DC

## 2015-03-28 NOTE — Assessment & Plan Note (Signed)
23 pound weight loss in the first month.

## 2015-03-28 NOTE — Progress Notes (Signed)
   Subjective:    Patient ID: Andrew Gray, male    DOB: Jul 10, 1980, 34 y.o.   MRN: [id number]  HPI Pt is here today for a blood pressure and weight check. Denies trouble sleeping, palpitations, or medication problems.    Review of Systems     Objective:   Physical Exam        Assessment & Plan:  Pt has lost weight. A refill for phentermine will be faxed to pharmacy. Pt advised to FU in 30 days for weight and BP check.

## 2015-04-10 ENCOUNTER — Telehealth: Payer: Self-pay | Admitting: Sports Medicine

## 2015-04-10 MED ORDER — TRIAMCINOLONE ACETONIDE 0.1 % EX CREA: TOPICAL_CREAM | CUTANEOUS | Status: DC

## 2015-04-10 NOTE — Telephone Encounter (Signed)
Patient complaints of dry, irritated skin, xerodermatitis, requesting a topical agent, adding eucerin/triamcinolone to be applied up to BID.

## 2015-04-26 ENCOUNTER — Ambulatory Visit (INDEPENDENT_AMBULATORY_CARE_PROVIDER_SITE_OTHER): Payer: 59 | Admitting: Sports Medicine

## 2015-04-26 ENCOUNTER — Other Ambulatory Visit: Payer: Self-pay | Admitting: Sports Medicine

## 2015-04-26 VITALS — BP 131/74 | HR 72 | Wt 245.0 lb

## 2015-04-26 DIAGNOSIS — R635 Abnormal weight gain: Secondary | ICD-10-CM | POA: Diagnosis not present

## 2015-04-26 MED ORDER — LIRAGLUTIDE -WEIGHT MANAGEMENT 18 MG/3ML ~~LOC~~ SOPN: 3.0000 mg | PEN_INJECTOR | Freq: Every day | SUBCUTANEOUS | Status: DC

## 2015-04-26 MED ORDER — PHENTERMINE HCL 37.5 MG PO TABS: ORAL_TABLET | ORAL | Status: DC

## 2015-04-26 NOTE — Assessment & Plan Note (Signed)
We will start to down taper phentermine to one half tab daily for a month, and I am going to add Saxenda.

## 2015-04-26 NOTE — Progress Notes (Signed)
  Subjective:    CC: Weight check  HPI: 23 pound weight loss in the first month, additionally pound weight loss after the second month bringing the total weight loss to 31 pounds. He does desire to down taper phentermine and start Saxenda. No adverse effects.  Past medical history, Surgical history, Family history not pertinant except as noted below, Social history, Allergies, and medications have been entered into the medical record, reviewed, and no changes needed.   Review of Systems: No fevers, chills, night sweats, weight loss, chest pain, or shortness of breath.   Objective:    General: Well Developed, well nourished, and in no acute distress.  Neuro: Alert and oriented x3, extra-ocular muscles intact, sensation grossly intact.  HEENT: Normocephalic, atraumatic, pupils equal round reactive to light, neck supple, no masses, no lymphadenopathy, thyroid nonpalpable.  Skin: Warm and dry, no rashes. Cardiac: Regular rate and rhythm, no murmurs rubs or gallops, no lower extremity edema.  Respiratory: Clear to auscultation bilaterally. Not using accessory muscles, speaking in full sentences.  Impression and Recommendations:

## 2015-04-26 NOTE — Progress Notes (Signed)
   Subjective:    Patient ID: Andrew Gray, male    DOB: Dec 25, 1980, 34 y.o.   MRN: [id number]  HPI Patient was in today for a weight and blood pressure check. Denies any medication problems.    Review of Systems     Objective:   Physical Exam        Assessment & Plan:  Patient has lost weight. A refill for phentermine will be faxed to pharmacy. Patient advised to schedule a follow up appointment nurse in 30 days.

## 2015-05-03 ENCOUNTER — Telehealth: Payer: Self-pay | Admitting: Sports Medicine

## 2015-05-03 NOTE — Telephone Encounter (Signed)
Received fax for prior authorization on Saxenda. Received authorization from 04/26/2015 - 08/24/2015. Case # [id number]. - CF

## 2015-05-19 ENCOUNTER — Other Ambulatory Visit: Payer: Self-pay | Admitting: Sports Medicine

## 2015-05-19 DIAGNOSIS — Z139 Encounter for screening, unspecified: Secondary | ICD-10-CM

## 2015-05-23 MED FILL — ULORIC 40 MG TABLET: 40 | 90 days supply | Qty: 90 | Fill #0

## 2015-05-23 MED FILL — LISINOPRIL 20 MG TABLET: 20 | 90 days supply | Qty: 90 | Fill #0

## 2015-05-26 ENCOUNTER — Ambulatory Visit: Payer: Self-pay

## 2015-05-26 DIAGNOSIS — Z139 Encounter for screening, unspecified: Secondary | ICD-10-CM

## 2015-05-31 ENCOUNTER — Other Ambulatory Visit: Payer: Self-pay | Admitting: Sports Medicine

## 2015-05-31 DIAGNOSIS — R635 Abnormal weight gain: Secondary | ICD-10-CM

## 2015-05-31 MED ORDER — LIRAGLUTIDE -WEIGHT MANAGEMENT 18 MG/3ML ~~LOC~~ SOPN: 3.0000 mg | PEN_INJECTOR | Freq: Every day | SUBCUTANEOUS | Status: DC

## 2015-05-31 MED FILL — SAXENDA 18 MG/3 ML PEN: 18 | 30 days supply | Qty: 15 | Fill #0

## 2015-06-03 ENCOUNTER — Other Ambulatory Visit: Payer: Self-pay | Admitting: Sports Medicine

## 2015-06-03 MED ORDER — PANTOPRAZOLE SODIUM 40 MG PO TBEC: 40.0000 mg | DELAYED_RELEASE_TABLET | Freq: Every day | ORAL | Status: DC

## 2015-06-03 MED FILL — PANTOPRAZOLE SOD DR 40 MG T: 40 | 90 days supply | Qty: 90 | Fill #0

## 2015-07-04 MED FILL — SAXENDA 18 MG/3 ML PEN: 18 | 30 days supply | Qty: 15 | Fill #1

## 2015-07-06 ENCOUNTER — Ambulatory Visit (INDEPENDENT_AMBULATORY_CARE_PROVIDER_SITE_OTHER): Payer: 59 | Admitting: Sports Medicine

## 2015-07-06 VITALS — BP 131/79 | HR 59 | Wt 235.0 lb

## 2015-07-06 DIAGNOSIS — R635 Abnormal weight gain: Secondary | ICD-10-CM

## 2015-07-06 NOTE — Progress Notes (Signed)
   Subjective:    Patient ID: Andrew Gray, male    DOB: 08-08-1980, 35 y.o.   MRN: [id number]  HPI   Patient doing well on appetite suppressant.  Here for nurse visit, weight, BP, HR check.  Denies problems with insomnia, heart palpitations or tremors.  Satisfied with weight loss thus far and is working on [employer] and regular exercise.    Review of Systems     Objective:   Physical Exam        Assessment & Plan:

## 2015-07-08 ENCOUNTER — Other Ambulatory Visit: Payer: Self-pay | Admitting: Sports Medicine

## 2015-07-08 DIAGNOSIS — M1A0790 Idiopathic chronic gout, unspecified ankle and foot, without tophus (tophi): Secondary | ICD-10-CM

## 2015-07-08 DIAGNOSIS — E785 Hyperlipidemia, unspecified: Secondary | ICD-10-CM

## 2015-07-12 DIAGNOSIS — E785 Hyperlipidemia, unspecified: Secondary | ICD-10-CM | POA: Diagnosis not present

## 2015-07-12 DIAGNOSIS — M1A0790 Idiopathic chronic gout, unspecified ankle and foot, without tophus (tophi): Secondary | ICD-10-CM | POA: Diagnosis not present

## 2015-07-12 LAB — CBC
HCT: 43.3 % (ref 39.0–52.0)
Hemoglobin: 14.9 g/dL (ref 13.0–17.0)
MCH: 29.5 pg (ref 26.0–34.0)
MCHC: 34.4 g/dL (ref 30.0–36.0)
MCV: 85.7 fL (ref 78.0–100.0)
MPV: 11 fL (ref 8.6–12.4)
Platelets: 221 K/uL (ref 150–400)
RBC: 5.05 MIL/uL (ref 4.22–5.81)
RDW: 13.3 % (ref 11.5–15.5)
WBC: 5 K/uL (ref 4.0–10.5)

## 2015-07-13 LAB — HEMOGLOBIN A1C
Hgb A1c MFr Bld: 4.7 % (ref <5.7)
Mean Plasma Glucose: 88 mg/dL (ref <117)

## 2015-07-13 LAB — COMPREHENSIVE METABOLIC PANEL
Albumin: 4.8 g/dL (ref 3.6–5.1)
CO2: 27 mmol/L (ref 20–31)
Chloride: 100 mmol/L (ref 98–110)
Creat: 0.89 mg/dL (ref 0.60–1.35)
Sodium: 136 mmol/L (ref 135–146)

## 2015-07-13 LAB — COMPREHENSIVE METABOLIC PANEL WITH GFR
ALT: 37 U/L (ref 9–46)
AST: 26 U/L (ref 10–40)
Alkaline Phosphatase: 53 U/L (ref 40–115)
BUN: 12 mg/dL (ref 7–25)
Calcium: 9.6 mg/dL (ref 8.6–10.3)
Glucose, Bld: 84 mg/dL (ref 65–99)
Potassium: 4.4 mmol/L (ref 3.5–5.3)
Total Bilirubin: 1 mg/dL (ref 0.2–1.2)
Total Protein: 7.5 g/dL (ref 6.1–8.1)

## 2015-07-13 LAB — LIPID PANEL
Cholesterol: 165 mg/dL (ref 125–200)
HDL: 47 mg/dL (ref >=40)
LDL Cholesterol: 104 mg/dL (ref <130)
Total CHOL/HDL Ratio: 3.5 Ratio (ref <=5.0)
Triglycerides: 72 mg/dL (ref <150)
VLDL: 14 mg/dL (ref <30)

## 2015-07-13 LAB — URIC ACID: Uric Acid, Serum: 5.4 mg/dL (ref 4.0–7.8)

## 2015-07-13 LAB — VITAMIN D 25 HYDROXY (VIT D DEFICIENCY, FRACTURES): Vit D, 25-Hydroxy: 57 ng/mL (ref 30–100)

## 2015-08-02 MED FILL — SAXENDA 18 MG/3 ML PEN: 18 | 30 days supply | Qty: 15 | Fill #2

## 2015-08-02 MED FILL — ULTICARE PEN NDL 8MM 31G: 31G X 8 MM | 90 days supply | Qty: 100 | Fill #0

## 2015-08-23 MED FILL — LISINOPRIL 20 MG TABLET: 20 | 90 days supply | Qty: 90 | Fill #1

## 2015-08-23 MED FILL — ULORIC 40 MG TABLET: 40 | 90 days supply | Qty: 90 | Fill #1

## 2015-08-29 MED FILL — PANTOPRAZOLE SOD DR 40 MG T: 40 | 90 days supply | Qty: 90 | Fill #1

## 2015-09-02 ENCOUNTER — Telehealth: Payer: Self-pay | Admitting: *Deleted

## 2015-09-02 NOTE — Telephone Encounter (Signed)
Reauth submitted for saxenda through covermymeds Key # [id number]

## 2015-09-05 MED FILL — SAXENDA 18 MG/3 ML PEN: 18 | 30 days supply | Qty: 15 | Fill #3

## 2015-09-07 NOTE — Telephone Encounter (Signed)
saxenda approved

## 2015-09-19 ENCOUNTER — Other Ambulatory Visit: Payer: Self-pay | Admitting: Sports Medicine

## 2015-09-19 DIAGNOSIS — M4317 Spondylolisthesis, lumbosacral region: Secondary | ICD-10-CM

## 2015-09-20 ENCOUNTER — Ambulatory Visit (INDEPENDENT_AMBULATORY_CARE_PROVIDER_SITE_OTHER): Payer: 59 | Admitting: Rehabilitative and Restorative Service Providers"

## 2015-09-20 ENCOUNTER — Encounter: Payer: Self-pay | Admitting: Rehabilitative and Restorative Service Providers"

## 2015-09-20 DIAGNOSIS — M5441 Lumbago with sciatica, right side: Secondary | ICD-10-CM

## 2015-09-20 DIAGNOSIS — R293 Abnormal posture: Secondary | ICD-10-CM

## 2015-09-20 NOTE — Patient Instructions (Addendum)
Printed initial HEP for visit 02/18/15 for pt to begin core work again   [employer] / HF, Supine    Lie near edge of bed, pul both knees toward chest,  one leg bent,hold knee toward chest. Other leg hanging over edge, relaxed, thigh resting entirely on bed. Bend hanging knee backward   Hold _30__ seconds.  Repeat __3_ times per session. Do __2-3_ sessions per day.   Piriformis Stretch    Lying on back, pull right knee toward opposite shoulder. Hold _30___ seconds. Repeat __3__ times. Do __2-3__ sessions per day.   Sleeping on Back  Place pillow under knees. A pillow with cervical support and a roll around waist are also helpful. Copyright  VHI. All rights reserved.  Sleeping on Side Place pillow between knees. Use cervical support under neck and a roll around waist as needed. Copyright  VHI. All rights reserved.   Sleeping on Stomach   If this is the only desirable sleeping position, place pillow under lower legs, and under stomach or chest as needed.  Posture - Sitting   Sit upright, head facing forward. Try using a roll to support lower back. Keep shoulders relaxed, and avoid rounded back. Keep hips level with knees. Avoid crossing legs for long periods. Stand to Sit / Sit to Stand   To sit: Bend knees to lower self onto front edge of chair, then scoot back on seat. To stand: Reverse sequence by placing one foot forward, and scoot to front of seat. Use rocking motion to stand up.   Work Height and Reach  Ideal work height is no more than 2 to 4 inches below elbow level when standing, and at elbow level when sitting. Reaching should be limited to arm's length, with elbows slightly bent.  Bending  Bend at hips and knees, not back. Keep feet shoulder-width apart.    Posture - Standing   Good posture is important. Avoid slouching and forward head thrust. Maintain curve in low back and align ears over shoul- ders, hips over ankles.  Alternating Positions   Alternate  tasks and change positions frequently to reduce fatigue and muscle tension. Take rest breaks. Computer Work   Position work to Programmer, multimedia. Use proper work and seat height. Keep shoulders back and down, wrists straight, and elbows at right angles. Use chair that provides full back support. Add footrest and lumbar roll as needed.  Getting Into / Out of Car  Lower self onto seat, scoot back, then bring in one leg at a time. Reverse sequence to get out.  Dressing  Lie on back to pull socks or slacks over feet, or sit and bend leg while keeping back straight.    Housework - Sink  Place one foot on ledge of cabinet under sink when standing at sink for prolonged periods.   Pushing / Pulling  Pushing is preferable to pulling. Keep back in proper alignment, and use leg muscles to do the work.  Deep Squat   Squat and lift with both arms held against upper trunk. Tighten stomach muscles without holding breath. Use smooth movements to avoid jerking.  Avoid Twisting   Avoid twisting or bending back. Pivot around using foot movements, and bend at knees if needed when reaching for articles.  Carrying Luggage   Distribute weight evenly on both sides. Use a cart whenever possible. Do not twist trunk. Move body as a unit.   Lifting Principles .Maintain proper posture and head alignment. .Slide object as close as possible  before lifting. .Move obstacles out of the way. .Test before lifting; ask for help if too heavy. .Tighten stomach muscles without holding breath. .Use smooth movements; do not jerk. .Use legs to do the work, and pivot with feet. .Distribute the work load symmetrically and close to the center of trunk. .Push instead of pull whenever possible.   Ask For Help   Ask for help and delegate to others when possible. Coordinate your movements when lifting together, and maintain the low back curve.  Log Roll   Lying on back, bend left knee and place left arm across  chest. Roll all in one movement to the right. Reverse to roll to the left. Always move as one unit. Housework - Sweeping  Use long-handled equipment to avoid stooping.   Housework - Wiping  Position yourself as close as possible to reach work surface. Avoid straining your back.  Laundry - Unloading Wash   To unload small items at bottom of washer, lift leg opposite to arm being used to reach.  [place] close to area to be raked. Use arm movements to do the work. Keep back straight and avoid twisting.     Cart  When reaching into cart with one arm, lift opposite leg to keep back straight.   Getting Into / Out of Bed  Lower self to lie down on one side by raising legs and lowering head at the same time. Use arms to assist moving without twisting. Bend both knees to roll onto back if desired. To sit up, start from lying on side, and use same move-ments in reverse. Housework - Vacuuming  Hold the vacuum with arm held at side. Step back and forth to move it, keeping head up. Avoid twisting.   Laundry - IT consultant so that bending and twisting can be avoided.   Laundry - Unloading Dryer  Squat down to reach into clothes dryer or use a reacher.  Gardening - Weeding / Probation officer or Kneel. Knee pads may be helpful.

## 2015-09-20 NOTE — Therapy (Signed)
[place], [name], [address] Phone: [phone]   Fax:  [phone]  Physical Therapy Evaluation  Patient Details  Name: Andrew Gray MRN: [id number] Date of Birth: 06/25/1980 Referring Provider: Dr. Dianah Field  Encounter Date: 09/20/2015      PT End of Session - 09/20/15 1319    Visit Number 1   Number of Visits 12   Date for PT Re-Evaluation 11/01/15   PT Start Time 1204   PT Stop Time 1259   PT Time Calculation (min) 55 min   Activity Tolerance Patient tolerated treatment well;Patient limited by pain      History reviewed. No pertinent past medical history.  History reviewed. No pertinent past surgical history.  There were no vitals filed for this visit.       Subjective Assessment - 09/20/15 1159    Subjective Hx of LBP for past 3 days - no known injury. Does recall walking up a steep incline fo 300-400 ft noticed some discomfort but no pain.    Pertinent History Hx of LBP for LBP ~ 6 months ago, resolved with PT and HEP - has not been consistent with HEP in the past several weeks; Rt ACL repair 2000; Fx Lt elbox in childhood    How long can you sit comfortably? no limit   How long can you stand comfortably? 5 min    How long can you walk comfortably? not at all    Patient Stated Goals get rid of pain and be able to walk run cycle without pain    Currently in Pain? Yes   Pain Score 6    Pain Location Back   Pain Orientation Right;Lower   Pain Descriptors / Indicators Constant;Aching   Pain Radiating Towards lateral calf and occ to lateral foot intermittent    Pain Frequency Constant   Pain Relieving Factors Flexion lying down sitting TENS unit some meds/heat some improvement             [facility] PT Assessment - 09/20/15 0001    Assessment   Medical Diagnosis Spondyloisthesis L5/S1   Referring Provider Dr. Dianah Field   Onset Date/Surgical Date 09/17/15   Hand Dominance  Right   Prior Therapy fall 2016   Precautions   Precautions None   Balance Screen   Has the patient fallen in the past 6 months No   Has the patient had a decrease in activity level because of a fear of falling?  No   Is the patient reluctant to leave their home because of a fear of falling?  No   Prior Function   Level of Independence Independent   Vocation Full time employment   Vocation Requirements physician   Leisure walking; hiking; biking; family time   Sensation   Additional Comments intermittent tingling into the lateral thigh/calf and occ to lateral foot Rt    Posture/Postural Control   Posture Comments lateral trunk shift to Rt with forward flexion at lumbar spine; Rt pelvis anterior; hips flexed   AROM   Lumbar Flexion 100%   no pain    Lumbar Extension 0  painful Rt LB   Lumbar - Right Side Bend 45%   painful Rt LB to hip    Lumbar - Left Side Bend 50%  OK   Lumbar - Right Rotation 40%   pain Rt LB   Lumbar - Left Rotation 40%  pulling Rt LB   Strength   Overall Strength Comments  WFL's functionally not tested    Flexibility   Soft Tissue Assessment /Muscle Length --  tight through psoas/hip flexors bilat Lt > Rt    Hamstrings WFL's   Quadriceps tight Rt > Lt   ITB some pull   Piriformis tight Rt > Lt    Palpation   Spinal mobility hypomobile through L3/4/5/S1 with CPA mobs    SI assessment  tender with palpation and spring testing Rt    Palpation comment tight Rt lumbar paraspinals; QL; hip abductors; psoas    Transfers   Comments difficulty with transfers due to pain - poor body mechanics with sit to stand and turning    Ambulation/Gait   Gait Comments ambulates with forward flexed posture with trunk laterally shifted to the Rt                    [facility] Adult PT Treatment/Exercise - 09/20/15 0001    Self-Care   Self-Care --  reminder of transitonal mvts/transfers/body mechanics    Neuro Re-ed    Neuro Re-ed Details  working on neutral  spine posture with standing   Lumbar Exercises: Stretches   Hip Flexor Stretch 3 reps;30 seconds  bilat PT assist    Piriformis Stretch 3 reps;30 seconds   Lumbar Exercises: Supine   Other Supine Lumbar Exercises 3 part core 10 sec hold    Lumbar Exercises: Prone   Other Prone Lumbar Exercises lying prone to achieve neutral spine    Moist Heat Therapy   Number Minutes Moist Heat 8 Minutes   Moist Heat Location Lumbar Spine   Electrical Stimulation   Electrical Stimulation Location Rt lumbar to hip area    Electrical Stimulation Action IFC   Electrical Stimulation Parameters to tolerance    Electrical Stimulation Goals Pain;Tone   Manual Therapy   Joint Mobilization lumbar PA glides Grade II/III   Soft tissue mobilization Rt lumbar paraspinals; Rt QL; hip abductors   Myofascial Release Rt lumbar spine/hip                 PT Education - 09/20/15 1255    Education provided Yes   Education Details HEP    Person(s) Educated Patient   Methods Demonstration;Tactile cues;Explanation;Verbal cues;Handout   Comprehension Verbalized understanding;Returned demonstration;Verbal cues required;Tactile cues required             PT Long Term Goals - 09/20/15 1325    PT LONG TERM GOAL #1   Title Improve posture and alignment with patient to demonstrate upright posture without lateral deviation and with neutral spine position 11/01/15   Time 6   Period Weeks   Status New   PT LONG TERM GOAL #2   Title Patient to report standing and walking for 30-45 min without pain 11/01/15   Time 6   Period Weeks   Status New   PT LONG TERM GOAL #3   Title Improve sore strength and stabitiy with patient to report/demonstrate good core contraction with stabilization program 11/01/15   Time 6   Period Weeks   Status New   PT LONG TERM GOAL #4   Title Independent with HEP 11/01/15   Time 6   Period Weeks   Status New               Plan - 09/20/15 1320    Clinical Impression  Statement 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 He has lateral trunk shift in standing  and with walking which is corrected with lying prone. There is muscular tightness through the Rt lumbar spine and Rt hip musculature as well as some tightness through the psoas bilat, Lt > Rt . Patient has poor body mechanics and movement patterns with compensatory movement patterns noted with transitional movement and gait as well as poor posture and algnment in sitting with trunk forward flexed. There is poor core stability. Patient has constant back pain with some relief noted with TENS, meds, heat. He wil lbenefit form PT to address problems identified and return to regular core stabilization program.    Rehab Potential Good   PT Frequency 2x / week   PT Duration 6 weeks   PT Treatment/Interventions Patient/family education;ADLs/Self Care Home Management;Neuromuscular re-education;Manual techniques;Dry needling;Therapeutic exercise;Therapeutic activities;Cryotherapy;Electrical Stimulation;Iontophoresis 4mg /ml Dexamethasone;Moist Heat;Ultrasound   PT Next Visit Plan TDN; core stabilization; manual work ; modalities   PT Home Exercise Plan HEP; TENs unit    Consulted and Agree with Plan of Care Patient      Patient will benefit from skilled therapeutic intervention in order to improve the following deficits and impairments:  Postural dysfunction, Improper body mechanics, Pain, Decreased range of motion, Decreased mobility, Decreased activity tolerance  Visit Diagnosis: Right-sided low back pain with right-sided sciatica - Plan: PT plan of care cert/re-cert  Abnormal posture - Plan: PT plan of care cert/re-cert     Problem List Patient Active Problem List   Diagnosis Date Noted  . Abnormal weight gain 02/23/2015  . Hyperlipidemia 02/23/2015  . Transaminitis 02/23/2015  . Essential hypertension, benign 02/23/2015  . Annual physical exam 02/16/2015   . Gout 02/16/2015  . Spondylolisthesis at L5-S1 level 02/15/2015    Mycah Formica Nilda Simmer PT, MPH  09/20/2015, 1:32 PM  [facility] [place], [name], [address] Phone: [phone]   Fax:  [phone]  Name: Andrew Gray MRN: [id number] Date of Birth: [birth year]

## 2015-09-27 ENCOUNTER — Encounter: Payer: Self-pay | Admitting: Physical Therapy

## 2015-09-27 ENCOUNTER — Ambulatory Visit (INDEPENDENT_AMBULATORY_CARE_PROVIDER_SITE_OTHER): Payer: 59 | Admitting: Physical Therapy

## 2015-09-27 DIAGNOSIS — M6281 Muscle weakness (generalized): Secondary | ICD-10-CM

## 2015-09-27 DIAGNOSIS — M545 Low back pain, unspecified: Secondary | ICD-10-CM

## 2015-09-27 DIAGNOSIS — R293 Abnormal posture: Secondary | ICD-10-CM

## 2015-09-27 DIAGNOSIS — M5441 Lumbago with sciatica, right side: Secondary | ICD-10-CM | POA: Diagnosis not present

## 2015-09-27 DIAGNOSIS — R29898 Other symptoms and signs involving the musculoskeletal system: Secondary | ICD-10-CM

## 2015-09-27 NOTE — Therapy (Signed)
[place], [name], [address] Phone: [phone]   Fax:  [phone]  Physical Therapy Treatment  Patient Details  Name: Andrew Gray MRN: [id number] Date of Birth: [birth year] Referring Provider: Dr. Dianah Field  Encounter Date: 09/27/2015      PT End of Session - 09/27/15 1202    Visit Number 2   Number of Visits 12   Date for PT Re-Evaluation 11/01/15   PT Start Time 1114   PT Stop Time 1159   PT Time Calculation (min) 45 min   Activity Tolerance Patient tolerated treatment well      History reviewed. No pertinent past medical history.  History reviewed. No pertinent past surgical history.  There were no vitals filed for this visit.      Subjective Assessment - 09/27/15 1156    Subjective He reports he is still having some pain in the Rt side low back and lateral hip.  He is able to stand up straighter.    Currently in Pain? Yes   Pain Score 5    Pain Orientation Right;Lower;Lateral   Pain Descriptors / Indicators Aching;Spasm;Sharp   Pain Type Acute pain   Pain Onset 1 to 4 weeks ago   Pain Frequency Constant   Aggravating Factors  standing   Pain Relieving Factors TDN, tens                         [facility] Adult PT Treatment/Exercise - 09/27/15 0001    Lumbar Exercises: Stretches   Active Hamstring Stretch 1 rep;30 seconds   Lower Trunk Rotation 1 rep;20 seconds   Piriformis Stretch 2 reps;30 seconds   Modalities   Modalities Electrical Stimulation;Moist Heat   Moist Heat Therapy   Number Minutes Moist Heat 15 Minutes   Moist Heat Location Lumbar Spine   Electrical Stimulation   Electrical Stimulation Location Rt lumbar to hip area    Electrical Stimulation Action IFC   Electrical Stimulation Parameters to tolerance   Electrical Stimulation Goals Pain;Tone   Manual Therapy   Joint Mobilization lumbar UPA glides Grade II/III  and Rt CPA   Soft tissue  mobilization Rt lumbar paraspinals; Rt QL; hip abductors          Trigger Point Dry Needling - 09/27/15 1200    Consent Given? Yes   Education Handout Provided No   Muscles Treated Upper Body Quadratus Lumborum   Muscles Treated Lower Body Gluteus minimus;Gluteus maximus   Gluteus Maximus Response Twitch response elicited;Palpable increased muscle length   Gluteus Minimus Response Twitch response elicited;Palpable increased muscle length                   PT Long Term Goals - 09/27/15 1203    PT LONG TERM GOAL #1   Title Improve posture and alignment with patient to demonstrate upright posture without lateral deviation and with neutral spine position 11/01/15   Status On-going   PT LONG TERM GOAL #2   Title Patient to report standing and walking for 30-45 min without pain 11/01/15   Status On-going   PT LONG TERM GOAL #3   Title Improve sore strength and stabitiy with patient to report/demonstrate good core contraction with stabilization program 11/01/15   Status On-going   PT LONG TERM GOAL #4   Title Independent with HEP 11/01/15   Status On-going               Plan -  09/27/15 1202    Clinical Impression Statement Heberto had good twitch responses to TDN all along the Rt posterior iliac crest and into the gluts.  Reproduced his symptoms with treatment.  Demo'd increased movement after treatment.    Rehab Potential Good   PT Frequency 2x / week   PT Duration 6 weeks   PT Treatment/Interventions Patient/family education;ADLs/Self Care Home Management;Neuromuscular re-education;Manual techniques;Dry needling;Therapeutic exercise;Therapeutic activities;Cryotherapy;Electrical Stimulation;Iontophoresis 4mg /ml Dexamethasone;Moist Heat;Ultrasound   PT Next Visit Plan TDN; core stabilization; manual work ; modalities   Consulted and Agree with Plan of Care Patient      Patient will benefit from skilled therapeutic intervention in order to improve the following deficits  and impairments:  Postural dysfunction, Improper body mechanics, Pain, Decreased range of motion, Decreased mobility, Decreased activity tolerance  Visit Diagnosis: Right-sided low back pain with right-sided sciatica  Abnormal posture  Weakness of back  Right-sided low back pain without sciatica     Problem List Patient Active Problem List   Diagnosis Date Noted  . Abnormal weight gain 02/23/2015  . Hyperlipidemia 02/23/2015  . Transaminitis 02/23/2015  . Essential hypertension, benign 02/23/2015  . Annual physical exam 02/16/2015  . Gout 02/16/2015  . Spondylolisthesis at L5-S1 level 02/15/2015    Jeral Pinch PT  09/27/2015, 12:05 PM  [facility] [place], [name], [address] Phone: [phone]   Fax:  [phone]  Name: ARAMIS CRITCHLOW MRN: [id number] Date of Birth: 1980/06/01

## 2015-09-30 MED FILL — SAXENDA 18 MG/3 ML PEN: 18 | 30 days supply | Qty: 15 | Fill #4

## 2015-10-06 ENCOUNTER — Encounter: Payer: 59 | Admitting: Physical Therapy

## 2015-10-06 ENCOUNTER — Ambulatory Visit (INDEPENDENT_AMBULATORY_CARE_PROVIDER_SITE_OTHER): Payer: 59 | Admitting: Physical Therapy

## 2015-10-06 ENCOUNTER — Encounter: Payer: Self-pay | Admitting: Physical Therapy

## 2015-10-06 DIAGNOSIS — M6281 Muscle weakness (generalized): Secondary | ICD-10-CM | POA: Diagnosis not present

## 2015-10-06 DIAGNOSIS — R29898 Other symptoms and signs involving the musculoskeletal system: Secondary | ICD-10-CM

## 2015-10-06 DIAGNOSIS — M5441 Lumbago with sciatica, right side: Secondary | ICD-10-CM

## 2015-10-06 DIAGNOSIS — R293 Abnormal posture: Secondary | ICD-10-CM

## 2015-10-06 DIAGNOSIS — M545 Low back pain, unspecified: Secondary | ICD-10-CM

## 2015-10-06 NOTE — Patient Instructions (Addendum)
Balance / Reach   [phone]    Stand on left foot, Holding __0__ pound weight in other hand. Bend knee, lowering body, and reach across. Hold __1__ seconds. Relax. Repeat __10-20__ times per set. Do _1___ sets per session. Do _1___ sessions per day.  Side Kick    Lie on side, back straight along edge of mat, legs 30-45 in front of torso. Lift top leg to hip height, foot flexed. Exhale, kicking forward twice. Inhale, kicking backward twicewith pointed foot. Keep leg hip height, torso still. Repeat _10-20___ times. Repeat on other side. Do __1__ sessions per day.  Over / Back: "Hot Potato"    Lie on side, back straight along edge of mat, legs 30-45 in front of torso. Touch heel of top foot in front of bottom leg, then quickly lift in a high arch, touch toes in back. Repeat __10-20__ times. Repeat on other side. Do __1__ sessions per day.   Copyright  VHI. All rights reserved.

## 2015-10-06 NOTE — Therapy (Signed)
[place], [name], [address] Phone: [phone]   Fax:  [phone]  Physical Therapy Treatment  Patient Details  Name: MAKAYLA CONFER MRN: [id number] Date of Birth: Apr 06, 1981 Referring Provider: Dr. Dianah Field  Encounter Date: 10/06/2015      PT End of Session - 10/06/15 1403    Visit Number 3   Number of Visits 12   Date for PT Re-Evaluation 11/01/15   PT Start Time 1400   PT Stop Time 1446   PT Time Calculation (min) 46 min   Activity Tolerance Patient tolerated treatment well      History reviewed. No pertinent past medical history.  History reviewed. No pertinent past surgical history.  There were no vitals filed for this visit.      Subjective Assessment - 10/06/15 1402    Currently in Pain? Yes   Pain Score 2    Pain Location Back   Pain Orientation Right   Pain Radiating Towards daily pain down into Rt LE   Pain Onset More than a month ago   Pain Frequency Constant   Aggravating Factors  standing and walking.                          [facility] Adult PT Treatment/Exercise - 10/06/15 0001    Lumbar Exercises: Supine   Bridge 5 reps  reviewed current HEP    Straight Leg Raise 5 reps  with abd bracing.    Lumbar Exercises: Sidelying   Other Sidelying Lumbar Exercises pilates pulses FWD/BWD, then toe/heel taps FWD and BWD   Lumbar Exercises: Prone   Other Prone Lumbar Exercises pelvic press upper and then lower body lifts.    Modalities   Modalities Electrical Stimulation;Moist Heat   Moist Heat Therapy   Number Minutes Moist Heat 15 Minutes   Moist Heat Location Lumbar Spine  Rt QL   Electrical Stimulation   Electrical Stimulation Location Rt QL/ low back   Electrical Stimulation Action IFC   Electrical Stimulation Parameters to tolerance   Electrical Stimulation Goals Pain;Tone   Manual Therapy   Soft tissue mobilization Rt lumbar paraspinals; Rt QL;  hip abductors          Trigger Point Dry Needling - 10/06/15 1417    Consent Given? Yes   Education Handout Provided No   Muscles Treated Upper Body Quadratus Lumborum  and along iliac crest               PT Education - 10/06/15 1434    Education provided Yes   Education Details HEP   Person(s) Educated Patient   Methods Explanation;Demonstration;Handout   Comprehension Verbalized understanding;Returned demonstration             PT Long Term Goals - 10/06/15 1423    PT LONG TERM GOAL #1   Title Improve posture and alignment with patient to demonstrate upright posture without lateral deviation and with neutral spine position 11/01/15   Status Achieved   PT LONG TERM GOAL #2   Title Patient to report standing and walking for 30-45 min without pain 11/01/15   Status On-going  10 min now   PT LONG TERM GOAL #3   Title Improve sore strength and stabitiy with patient to report/demonstrate good core contraction with stabilization program 11/01/15   Status On-going   PT LONG TERM GOAL #4   Title Independent with HEP 11/01/15   Status On-going  Plan - 10/06/15 1439    Clinical Impression Statement Patient has decreased tightness in the Rt lumbar paraspinals.  His QL is still tight however not as much as last week.  He exhibits weakness in the Rt glut/hip during higher level ther ex.  Met one goal and progressing to the others.    Rehab Potential Good   PT Frequency 2x / week   PT Duration 6 weeks   PT Treatment/Interventions Patient/family education;ADLs/Self Care Home Management;Neuromuscular re-education;Manual techniques;Dry needling;Therapeutic exercise;Therapeutic activities;Cryotherapy;Electrical Stimulation;Iontophoresis '4mg'$ /ml Dexamethasone;Moist Heat;Ultrasound   PT Next Visit Plan if radicular symptoms persist, may want to try lumbar traction   Consulted and Agree with Plan of Care Patient      Patient will benefit from skilled  therapeutic intervention in order to improve the following deficits and impairments:  Postural dysfunction, Improper body mechanics, Pain, Decreased range of motion, Decreased mobility, Decreased activity tolerance  Visit Diagnosis: Right-sided low back pain with right-sided sciatica  Abnormal posture  Weakness of back  Right-sided low back pain without sciatica     Problem List Patient Active Problem List   Diagnosis Date Noted  . Abnormal weight gain 02/23/2015  . Hyperlipidemia 02/23/2015  . Transaminitis 02/23/2015  . Essential hypertension, benign 02/23/2015  . Annual physical exam 02/16/2015  . Gout 02/16/2015  . Spondylolisthesis at L5-S1 level 02/15/2015    Jeral Pinch PT 10/06/2015, 2:40 PM  [facility] [place], [name], [address] Phone: [phone]   Fax:  [phone]  Name: JESSEJAMES STEELMAN MRN: [id number] Date of Birth: 1980-08-24

## 2015-10-11 ENCOUNTER — Ambulatory Visit (INDEPENDENT_AMBULATORY_CARE_PROVIDER_SITE_OTHER): Payer: 59 | Admitting: Physical Therapy

## 2015-10-11 DIAGNOSIS — R293 Abnormal posture: Secondary | ICD-10-CM | POA: Diagnosis not present

## 2015-10-11 DIAGNOSIS — R29898 Other symptoms and signs involving the musculoskeletal system: Secondary | ICD-10-CM

## 2015-10-11 DIAGNOSIS — M5441 Lumbago with sciatica, right side: Secondary | ICD-10-CM

## 2015-10-11 DIAGNOSIS — M6281 Muscle weakness (generalized): Secondary | ICD-10-CM | POA: Diagnosis not present

## 2015-10-11 NOTE — Patient Instructions (Signed)
Quads / HF, Prone    Lie face down, knees together. Grasp one ankle with same-side hand. Use towel if needed to reach. Gently pull foot toward buttock. Hold __45-60_ seconds. Repeat _2__ times per session. Do _1__ sessions per day.  Copyright  VHI. All rights reserved.

## 2015-10-11 NOTE — Therapy (Signed)
[place], [name], [address] Phone: [phone]   Fax:  [phone]  Physical Therapy Treatment  Patient Details  Name: Andrew Gray MRN: [id number] Date of Birth: Mar 14, 1981 Referring Provider: Dr. Dianah Field  Encounter Date: 10/11/2015      PT End of Session - 10/11/15 1133    Visit Number 4   Number of Visits 12   Date for PT Re-Evaluation 11/01/15   PT Start Time 1058   PT Stop Time 1133   PT Time Calculation (min) 35 min   Activity Tolerance Patient tolerated treatment well      No past medical history on file.  No past surgical history on file.  There were no vitals filed for this visit.      Subjective Assessment - 10/11/15 1103    Subjective He is doing well, did have some pain into the Rt LE with supine sleeping     Currently in Pain? Yes   Pain Score 2    Pain Location Back   Pain Orientation Right   Pain Descriptors / Indicators Aching   Pain Type Acute pain   Pain Frequency Intermittent   Aggravating Factors  prolonged standing and walking    Pain Relieving Factors pain                         [facility] Adult PT Treatment/Exercise - 10/11/15 0001    Lumbar Exercises: Stretches   Double Knee to Chest Stretch --  childs pose   Hip Flexor Stretch --  in low lunge position.    Quad Stretch 30 seconds  prone with strap   Piriformis Stretch 30 seconds  VC to change form   Lumbar Exercises: Sidelying   Clam 20 reps  regular and reverse   Lumbar Exercises: Prone   Other Prone Lumbar Exercises pelvic press lower body series, the    Other Prone Lumbar Exercises 3 reps upper body lift x10sec then LE lift ER heel taps.    Modalities   Modalities Electrical Stimulation;Iontophoresis   Iontophoresis   Type of Iontophoresis Dexamethasone   Location Rt posterior greater troc   Dose 1.0cc   Time 6 hr stat patch   Manual Therapy   Manual Therapy Soft tissue  mobilization;Passive ROM   Soft tissue mobilization Rt gluts, anterior Rt hip mobs   Passive ROM Rt hip stretching IR/ER                 PT Education - 10/11/15 1114    Education provided Yes   Education Details prone quad stretch    Person(s) Educated Patient   Methods Explanation;Handout   Comprehension Returned demonstration             PT Long Term Goals - 10/06/15 1423    PT LONG TERM GOAL #1   Title Improve posture and alignment with patient to demonstrate upright posture without lateral deviation and with neutral spine position 11/01/15   Status Achieved   PT LONG TERM GOAL #2   Title Patient to report standing and walking for 30-45 min without pain 11/01/15   Status On-going  10 min now   PT LONG TERM GOAL #3   Title Improve sore strength and stabitiy with patient to report/demonstrate good core contraction with stabilization program 11/01/15   Status On-going   PT LONG TERM GOAL #4   Title Independent with HEP 11/01/15   Status On-going  Plan - 10/11/15 1156    Clinical Impression Statement Patient with decreased pain in his low back, now locallized posterior to the Rt greater trochanter.  He has deep weakness in his multifidi, recommend he go back and focus on pelvic press routine.    PT Frequency 2x / week   PT Duration 6 weeks   PT Treatment/Interventions Patient/family education;ADLs/Self Care Home Management;Neuromuscular re-education;Manual techniques;Dry needling;Therapeutic exercise;Therapeutic activities;Cryotherapy;Electrical Stimulation;Iontophoresis 4mg /ml Dexamethasone;Moist Heat;Ultrasound   PT Next Visit Plan deep core work , Psychologist, counselling with Plan of Care Patient      Patient will benefit from skilled therapeutic intervention in order to improve the following deficits and impairments:  Postural dysfunction, Improper body mechanics, Pain, Decreased range of motion, Decreased mobility, Decreased activity  tolerance  Visit Diagnosis: Right-sided low back pain with right-sided sciatica  Abnormal posture  Weakness of back     Problem List Patient Active Problem List   Diagnosis Date Noted  . Abnormal weight gain 02/23/2015  . Hyperlipidemia 02/23/2015  . Transaminitis 02/23/2015  . Essential hypertension, benign 02/23/2015  . Annual physical exam 02/16/2015  . Gout 02/16/2015  . Spondylolisthesis at L5-S1 level 02/15/2015    Jeral Pinch PT 10/11/2015, 11:58 AM  [facility] [place], [name], [address] Phone: [phone]   Fax:  [phone]  Name: Andrew Gray MRN: [id number] Date of Birth: 10-31-1980

## 2015-10-26 ENCOUNTER — Other Ambulatory Visit: Payer: Self-pay | Admitting: Sports Medicine

## 2015-10-26 DIAGNOSIS — R635 Abnormal weight gain: Secondary | ICD-10-CM

## 2015-10-26 MED ORDER — PHENTERMINE HCL 37.5 MG PO TABS: ORAL_TABLET | ORAL | Status: DC

## 2015-10-26 NOTE — Assessment & Plan Note (Signed)
Continue Saxenda, adding phentermine which will be taken intermittently.

## 2015-10-27 MED FILL — PHENTERMINE 37.5 MG TABLET: 37.5 | 90 days supply | Qty: 90 | Fill #0

## 2015-11-19 ENCOUNTER — Encounter: Payer: Self-pay | Admitting: Sports Medicine

## 2015-11-21 MED ORDER — FEBUXOSTAT 40 MG PO TABS: 40.0000 mg | ORAL_TABLET | Freq: Every day | ORAL | Status: DC

## 2015-11-21 MED FILL — LISINOPRIL 20 MG TABLET: 20 | 90 days supply | Qty: 90 | Fill #2

## 2015-11-21 MED FILL — ULORIC 40 MG TABLET: 40 | 90 days supply | Qty: 90 | Fill #0

## 2015-11-21 MED FILL — ULTICARE PEN NDL 8MM 31G: 31G X 8 MM | 90 days supply | Qty: 100 | Fill #1

## 2015-11-21 MED FILL — SAXENDA 18 MG/3 ML PEN: 18 | 30 days supply | Qty: 15 | Fill #5

## 2015-11-29 MED FILL — PANTOPRAZOLE SOD DR 40 MG T: 40 | 90 days supply | Qty: 90 | Fill #2

## 2015-12-19 MED FILL — SAXENDA 18 MG/3 ML PEN: 18 | 30 days supply | Qty: 15 | Fill #6

## 2016-01-20 DIAGNOSIS — F419 Anxiety disorder, unspecified: Secondary | ICD-10-CM | POA: Diagnosis not present

## 2016-01-20 DIAGNOSIS — R41840 Attention and concentration deficit: Secondary | ICD-10-CM | POA: Diagnosis not present

## 2016-01-23 MED FILL — VYVANSE 20 MG CAPSULE: 20 | 30 days supply | Qty: 30 | Fill #0

## 2016-02-20 ENCOUNTER — Other Ambulatory Visit: Payer: Self-pay | Admitting: Sports Medicine

## 2016-02-20 DIAGNOSIS — I10 Essential (primary) hypertension: Secondary | ICD-10-CM

## 2016-02-20 MED FILL — LISINOPRIL 20 MG TABLET: 20 | 90 days supply | Qty: 90 | Fill #0

## 2016-02-20 MED FILL — ULORIC 40 MG TABLET: 40 | 90 days supply | Qty: 90 | Fill #1

## 2016-02-24 DIAGNOSIS — F419 Anxiety disorder, unspecified: Secondary | ICD-10-CM | POA: Diagnosis not present

## 2016-02-24 DIAGNOSIS — Z79899 Other long term (current) drug therapy: Secondary | ICD-10-CM | POA: Diagnosis not present

## 2016-02-24 MED FILL — VYVANSE 20 MG CAPSULE: 20 | 30 days supply | Qty: 30 | Fill #0

## 2016-02-27 MED FILL — PANTOPRAZOLE SOD DR 40 MG T: 40 | 90 days supply | Qty: 90 | Fill #3

## 2016-03-27 MED FILL — VYVANSE 20 MG CAPSULE: 20 | 30 days supply | Qty: 30 | Fill #0

## 2016-04-13 MED FILL — NARCAN 4 MG NASAL SPRAY: 4 | 2 days supply | Qty: 2 | Fill #0

## 2016-05-04 MED FILL — VYVANSE 20 MG CAPSULE: 20 | 30 days supply | Qty: 30 | Fill #0

## 2016-05-21 MED FILL — ULORIC 40 MG TABLET: 40 | 90 days supply | Qty: 90 | Fill #2

## 2016-05-21 MED FILL — LISINOPRIL 20 MG TABLET: 20 | 90 days supply | Qty: 90 | Fill #1

## 2016-05-25 DIAGNOSIS — F419 Anxiety disorder, unspecified: Secondary | ICD-10-CM | POA: Diagnosis not present

## 2016-05-25 DIAGNOSIS — F902 Attention-deficit hyperactivity disorder, combined type: Secondary | ICD-10-CM | POA: Diagnosis not present

## 2016-05-25 DIAGNOSIS — Z79899 Other long term (current) drug therapy: Secondary | ICD-10-CM | POA: Diagnosis not present

## 2016-05-28 ENCOUNTER — Other Ambulatory Visit: Payer: Self-pay | Admitting: Sports Medicine

## 2016-05-28 MED FILL — PANTOPRAZOLE SOD DR 40 MG T: 40 | 90 days supply | Qty: 90 | Fill #0

## 2016-06-01 ENCOUNTER — Other Ambulatory Visit: Payer: Self-pay | Admitting: Sports Medicine

## 2016-06-01 DIAGNOSIS — M5136 Other intervertebral disc degeneration, lumbar region: Secondary | ICD-10-CM

## 2016-06-01 DIAGNOSIS — M51369 Other intervertebral disc degeneration, lumbar region without mention of lumbar back pain or lower extremity pain: Secondary | ICD-10-CM

## 2016-06-07 MED FILL — VYVANSE 20 MG CAPSULE: 20 | 30 days supply | Qty: 30 | Fill #0

## 2016-06-08 DIAGNOSIS — Z3009 Encounter for other general counseling and advice on contraception: Secondary | ICD-10-CM | POA: Diagnosis not present

## 2016-06-15 ENCOUNTER — Encounter: Payer: Self-pay | Admitting: Rehabilitative and Restorative Service Providers"

## 2016-06-15 ENCOUNTER — Ambulatory Visit (INDEPENDENT_AMBULATORY_CARE_PROVIDER_SITE_OTHER): Payer: 59 | Admitting: Rehabilitative and Restorative Service Providers"

## 2016-06-15 DIAGNOSIS — M545 Low back pain, unspecified: Secondary | ICD-10-CM

## 2016-06-15 DIAGNOSIS — R29898 Other symptoms and signs involving the musculoskeletal system: Secondary | ICD-10-CM

## 2016-06-15 DIAGNOSIS — R293 Abnormal posture: Secondary | ICD-10-CM | POA: Diagnosis not present

## 2016-06-15 DIAGNOSIS — R531 Weakness: Secondary | ICD-10-CM | POA: Diagnosis not present

## 2016-06-15 NOTE — Therapy (Signed)
[place], [name], [address] Phone: [phone]   Fax:  [phone]  Physical Therapy Evaluation  Patient Details  Name: Andrew Gray MRN: [id number] Date of Birth: [birth year] Referring Provider: Dr Dianah Field  Encounter Date: 06/15/2016      PT End of Session - 06/15/16 1700    Visit Number 1   Number of Visits 12   Date for PT Re-Evaluation 07/27/16   PT Start Time 1448   PT Stop Time 1552   PT Time Calculation (min) 64 min   Activity Tolerance Patient tolerated treatment well      History reviewed. No pertinent past medical history.  History reviewed. No pertinent surgical history.  There were no vitals filed for this visit.       Subjective Assessment - 06/15/16 1452    Subjective LBP increased 2 months - history of LBP for years. Pain now radiating into the Lt lateral thigh into the calf, occasionally into the foot. Heading to [place] with his family in ?April and would like to be able to stand and walk without pain.    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 no limit    How long can you stand comfortably? 5 min    How long can you walk comfortably? up hill no limit; level 15-20 min    Diagnostic tests MRI; xrays   Patient Stated Goals standing and walking without pain; learn exercises for home    Currently in Pain? Yes   Pain Score 3    Pain Location Back   Pain Orientation Lower;Left   Pain Descriptors / Indicators Nagging;Aching   Pain Type Chronic pain   Pain Radiating Towards Lt lateral thigh and calf occ to foot    Pain Onset More than a month ago   Pain Frequency Intermittent   Aggravating Factors  standing > 5 min; walking level surfaces    Pain Relieving Factors sitting            [facility] PT Assessment - 06/15/16 0001      Assessment   Medical Diagnosis DDD lumbar spine    Referring Provider Dr Dianah Field   Onset Date/Surgical Date 04/20/16  chronic LBP for years    Hand Dominance Right   Next MD Visit PRN   Prior Therapy yes - 10/14-11/18/16 x 5 visits; 5/16-10/11/15 x 4 visits      Precautions   Precautions None     Balance Screen   Has the patient fallen in the past 6 months No   Has the patient had a decrease in activity level because of a fear of falling?  No   Is the patient reluctant to leave their home because of a fear of falling?  No     Home Ecologist residence   Additional Comments no difficulty with stairs      Prior Function   Level of Independence Independent   Vocation Full time employment   Vocation Requirements MD - sitting, standing, walking    Leisure biking; hiking; spending time with family      Observation/Other Assessments   Focus on Therapeutic Outcomes (FOTO)  42% limitation      Sensation   Additional Comments intermittent tingling and numbness lateral Lt thigh into calf and occasionally foot      Posture/Postural Control   Posture Comments head forward;  shoulders rounded and elevated; increased thoracic kyphosis; decreased lumbar lordosis     AROM   Overall AROM Comments LE ROM WNL's bilat    Lumbar Flexion 100%   Lumbar Extension 40%  discomfort lumbar spine    Lumbar - Right Side Bend 60%   Lumbar - Left Side Bend 50%  discomfort Lt LB into Lt LE    Lumbar - Right Rotation 50%   Lumbar - Left Rotation 40%  tight     Strength   Overall Strength Comments WNL's bilat LE's      Flexibility   Hamstrings tight Rt 90 deg; Lt 80 deg    Quadriceps tight bilat prone heel to buttock Rt ~ 110 deg; Lt 100 deg    ITB WFL's   Piriformis some tightness Lt > Rt      Palpation   Spinal mobility hypomobile L3/4/5/S1 with CPA and UPA mobs Lt > Rt; tight throughout thoracic spine with CPA mobs    Palpation comment muscular tightness Lt > Rt lumbar paraspinals/QL/multifidi      Special  Tests    Special Tests --  (+) SLR Lt with radicular symptoms into the Lt thigh/calf                   [facility] Adult PT Treatment/Exercise - 06/15/16 0001      Lumbar Exercises: Stretches   Press Ups --  1-2 sec x 10 partial press up to pt limit    Press Ups Limitations encouraging normal spinal extension      Lumbar Exercises: Quadruped   Single Arm Raise Right;Left;10 reps  engaging core    Straight Leg Raise 10 reps   Straight Leg Raises Limitations Rt/Lt to clear toe from table engaging core instead of more hip extensors   Opposite Arm/Leg Raise Right arm/Left leg;Left arm/Right leg;10 reps  engaging core      Moist Heat Therapy   Number Minutes Moist Heat 20 Minutes   Moist Heat Location Lumbar Spine     Electrical Stimulation   Electrical Stimulation Location Lt lumbar spine   Electrical Stimulation Action IFC   Electrical Stimulation Parameters to tolerance   Electrical Stimulation Goals Pain;Tone     Manual Therapy   Joint Mobilization CPA mobs thoracic spine; CPA and UPA mobs L2/3/4/5; UPA mobs L3/4/5 Grade III    Soft tissue mobilization Lt lumbar musculature           Trigger Point Dry Needling - 06/15/16 1656    Consent Given? Yes   Education Handout Provided Yes   Muscles Treated Lower Body --  Lt deep spinal mm/paraspinals-twitch/decreased tightness               PT Education - 06/15/16 1657    Education provided Yes   Education Details DN; HEP    Person(s) Educated Patient   Methods Explanation;Demonstration;Tactile cues;Verbal cues   Comprehension Verbalized understanding;Returned demonstration;Verbal cues required;Tactile cues required             PT Long Term Goals - 06/15/16 1707      PT LONG TERM GOAL #1   Title Improve posture and alignment with patient to demonstrate upright posture without lateral deviation and with neutral spine position 07/27/16   Time 6   Period Weeks   Status New     PT LONG TERM GOAL #2    Title Patient to report standing and walking for 30-45 min without pain 07/27/16   Time 6   Period  Weeks   Status New     PT LONG TERM GOAL #3   Title Improve core strength and stabitiy with patient to report/demonstrate good core contraction with stabilization program 07/27/16   Time 6   Period Weeks   Status New     PT LONG TERM GOAL #4   Title Independent with HEP 07/27/16   Time 6   Period Weeks   Status New     PT LONG TERM GOAL #5   Title Improve FOTO to </= 28% limitation 07/27/16   Time 6   Period Weeks   Status New               Plan - 06/15/16 1700    Clinical Impression Statement 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 He has a history of chronic LBP of intermittent nature for several years. He has poor spinal posture with increased thoracic kyphosis; decreased lumbar lordosis; posterior pelvic tilt posture in all positions. He hsa tightness in Lt hamstring with (+) SLR on Lt at ~ 80 deg; hypomobility through thoracic and lumbar spine with CPA mobilizations; muscular tightness through deep paraspinals through lumbar spine Lt > Rt; radicular sensory changes Lt LE; limited functional actitiy level specifically for standing and walking; pain on a daily basis. he will benefit from PT to address limitations noted.    Rehab Potential Good   PT Frequency 1x / week   PT Duration 6 weeks   PT Treatment/Interventions Patient/family education;Neuromuscular re-education;ADLs/Self Care Home Management;Cryotherapy;Electrical Stimulation;Iontophoresis 4mg /ml Dexamethasone;Moist Heat;Traction;Ultrasound;Dry needling;Manual techniques;Therapeutic activities;Therapeutic exercise   PT Next Visit Plan Assess response to DN and lumbar/thoracic mobs; progress with core stabilization and spinal mobility/stability exercises; DN and manual work as indicated; Mudlogger and Agree with Plan of Care Patient      Patient will benefit  from skilled therapeutic intervention in order to improve the following deficits and impairments:  Postural dysfunction, Improper body mechanics, Pain, Increased fascial restricitons, Increased muscle spasms, Decreased mobility, Decreased activity tolerance  Visit Diagnosis: Acute left-sided low back pain without sciatica - Plan: PT plan of care cert/re-cert  Abnormal posture - Plan: PT plan of care cert/re-cert  Weakness generalized - Plan: PT plan of care cert/re-cert  Other symptoms and signs involving the musculoskeletal system - Plan: PT plan of care cert/re-cert     Problem List Patient Active Problem List   Diagnosis Date Noted  . Abnormal weight gain 02/23/2015  . Hyperlipidemia 02/23/2015  . Transaminitis 02/23/2015  . Essential hypertension, benign 02/23/2015  . Annual physical exam 02/16/2015  . Gout 02/16/2015  . Spondylolisthesis at L5-S1 level 02/15/2015    Brianna Esson Nilda Simmer PT, MPH  06/15/2016, 5:12 PM  [facility] [place], [name], [address] Phone: [phone]   Fax:  [phone]  Name: NIKALAS CHIMENTO MRN: [id number] Date of Birth: 10-Jun-1980

## 2016-06-15 NOTE — Patient Instructions (Addendum)
Trigger Point Dry Needling  . What is Trigger Point Dry Needling (DN)? o DN is a physical therapy technique used to treat muscle pain and dysfunction. Specifically, DN helps deactivate muscle trigger points (muscle knots).  o A thin filiform needle is used to penetrate the skin and stimulate the underlying trigger point. The goal is for a local twitch response (LTR) to occur and for the trigger point to relax. No medication of any kind is injected during the procedure.   . What Does Trigger Point Dry Needling Feel Like?  o The procedure feels different for each individual patient. Some patients report that they do not actually feel the needle enter the skin and overall the process is not painful. Very mild bleeding may occur. However, many patients feel a deep cramping in the muscle in which the needle was inserted. This is the local twitch response.   Marland Kitchen How Will I feel after the treatment? o Soreness is normal, and the onset of soreness may not occur for a few hours. Typically this soreness does not last longer than two days.  o Bruising is uncommon, however; ice can be used to decrease any possible bruising.  o In rare cases feeling tired or nauseous after the treatment is normal. In addition, your symptoms may get worse before they get better, this period will typically not last longer than 24 hours.   . What Can I do After My Treatment? o Increase your hydration by drinking more water for the next 24 hours. o You may place ice or heat on the areas treated that have become sore, however, do not use heat on inflamed or bruised areas. Heat often brings more relief post needling. o You can continue your regular activities, but vigorous activity is not recommended initially after the treatment for 24 hours. o DN is best combined with other physical therapy such as strengthening, stretching, and other therapies.    Upper / Lower Extremity Extension (All-Fours)    Tighten stomach and raise  right leg and opposite arm. Keep trunk rigid. Repeat ____ times per set. Do ____ sets per session. Do ____ sessions per day.    Upper Extremity Extension (All-Fours)    Tighten stomach and raise left arm parallel to floor. Keep trunk rigid. Repeat ____ times per set. Do ____ sets per session. Do ____ sessions per day.

## 2016-06-22 ENCOUNTER — Ambulatory Visit (INDEPENDENT_AMBULATORY_CARE_PROVIDER_SITE_OTHER): Payer: 59 | Admitting: Physical Therapy

## 2016-06-22 DIAGNOSIS — R531 Weakness: Secondary | ICD-10-CM | POA: Diagnosis not present

## 2016-06-22 DIAGNOSIS — M545 Low back pain, unspecified: Secondary | ICD-10-CM

## 2016-06-22 DIAGNOSIS — R293 Abnormal posture: Secondary | ICD-10-CM

## 2016-06-22 NOTE — Therapy (Signed)
[place], [name], [address] Phone: [phone]   Fax:  [phone]  Physical Therapy Treatment  Patient Details  Name: Andrew Gray MRN: [id number] Date of Birth: 04/25/1981 Referring Provider: Dr. Dianah Field  Encounter Date: 06/22/2016      PT End of Session - 06/22/16 1352    Visit Number 2   Number of Visits 12   Date for PT Re-Evaluation 07/27/16   PT Start Time 1330   PT Stop Time 1410   PT Time Calculation (min) 40 min      No past medical history on file.  No past surgical history on file.  There were no vitals filed for this visit.      Subjective Assessment - 06/22/16 1420    Subjective Pt reports he continues with pain into Lt lateral thigh into calf on an intermittent basis. LBP is much better. Working on exercises.    Patient Stated Goals standing and walking without pain; learn exercises for home    Currently in Pain? Yes   Pain Score 3    Pain Location Back   Pain Orientation Left;Lower   Pain Descriptors / Indicators Aching   Aggravating Factors  standing > 5 min; walking on level surfaces   Pain Relieving Factors sitting             [facility] PT Assessment - 06/22/16 0001      Assessment   Medical Diagnosis DDD lumbar spine    Referring Provider Dr. Dianah Field   Onset Date/Surgical Date 04/20/16   Hand Dominance Right   Next MD Visit PRN                     [facility] Adult PT Treatment/Exercise - 06/22/16 0001      Lumbar Exercises: Stretches   Press Ups --  10 reps 2-3 sec hold    Press Ups Limitations added exhale with sag    Quad Stretch 2 reps;30 seconds   Quad Stretch Limitations Thomas stretch in supine, over full foam roller x 2 reps each leg for 15 sec each.      Lumbar Exercises: Seated   Hip Flexion on Ball Limitations sitting on dynadisc:  pelvic tilts (ant/post, side to side, CW/CCW)     Lumbar Exercises: Quadruped   Madcat/Old  Horse 10 reps     Modalities   Modalities --  pt declined.      Manual Therapy   Joint Mobilization CPA mobs L2/3/4/5; UPA mobs L3/4/5 Grade III    Soft tissue mobilization Lt lumbar musculature - paraspinals/QL/multifidi           Trigger Point Dry Needling - 06/22/16 1559    Consent Given? Yes   Muscles Treated Lower Body --  Lt deep paraspinals/multifidi - decreased tightness noted               PT Education - 06/22/16 1419    Education provided Yes   Education Details HEP   Person(s) Educated Patient   Methods Explanation;Handout   Comprehension Verbalized understanding             PT Long Term Goals - 06/22/16 1423      PT LONG TERM GOAL #1   Title Improve posture and alignment with patient to demonstrate upright posture without lateral deviation and with neutral spine position 07/27/16   Time 6   Period Weeks   Status On-going     PT LONG  TERM GOAL #2   Title Patient to report standing and walking for 30-45 min without pain 07/27/16   Time 6   Period Weeks   Status On-going     PT LONG TERM GOAL #3   Title Improve core strength and stabilty with patient to report/demonstrate good core contraction with stabilization program 07/27/16   Time 6   Period Weeks   Status On-going     PT LONG TERM GOAL #4   Title Independent with HEP 07/27/16   Time 6   Period Weeks   Status On-going     PT LONG TERM GOAL #5   Title Improve FOTO to </= 28% limitation 07/27/16   Time 6   Period Weeks   Status On-going               Plan - 06/22/16 1416    Clinical Impression Statement Pt reported elimination of LE radicular symptoms and demonstrated improved lumbar extension after DN and  lumbar mobilizations (provided by supervising PT, Celyn Holt) and stretches.  Pt declined modalities at end of session; will do once he returns home today. Pt making progress towards established goals. Needs continued work on lumbar extension and segmental mobity through the  lumbar spine; thoracic extension; core stability; anterior chain stretching(quads/hip flexors/abs).    Rehab Potential Good   PT Frequency 1x / week   PT Duration 6 weeks   PT Treatment/Interventions Patient/family education;Neuromuscular re-education;ADLs/Self Care Home Management;Cryotherapy;Electrical Stimulation;Iontophoresis 4mg /ml Dexamethasone;Moist Heat;Traction;Ultrasound;Dry needling;Manual techniques;Therapeutic activities;Therapeutic exercise   PT Next Visit Plan good response to DN and lumbar/thoracic mobs; progress with core stabilization and spinal mobility/stability exercises; DN and manual work as indicated; modalities - address thoracic extension continue work on lumbar extension avoiding other planes of motioin for now with focus on A/P movement to improve segmental mobility    Consulted and Agree with Plan of Care Patient      Patient will benefit from skilled therapeutic intervention in order to improve the following deficits and impairments:  Postural dysfunction, Improper body mechanics, Pain, Increased fascial restricitons, Increased muscle spasms, Decreased mobility, Decreased activity tolerance  Visit Diagnosis: Acute left-sided low back pain without sciatica  Abnormal posture  Weakness generalized     Problem List Patient Active Problem List   Diagnosis Date Noted  . Abnormal weight gain 02/23/2015  . Hyperlipidemia 02/23/2015  . Transaminitis 02/23/2015  . Essential hypertension, benign 02/23/2015  . Annual physical exam 02/16/2015  . Gout 02/16/2015  . Spondylolisthesis at L5-S1 level 02/15/2015    Celyn Nilda Simmer PT, MPH  06/22/2016, 4:04 PM  [facility] [place], [name], [address] Phone: [phone]   Fax:  [phone]  Name: Andrew Gray MRN: [id number] Date of Birth: 1980/10/31

## 2016-06-22 NOTE — Patient Instructions (Signed)
Cat / Cow Flow    Inhale, press spine toward ceiling like a Halloween cat. Keeping strength in arms and abdominals, exhale to soften spine through neutral and into cow pose. Open chest and arch back. Initiate movement between cat and cow at tailbone, one vertebrae at a time. Repeat _5-10___ times.  Therapeutic - Prone Press-Up    Push up so chest is off floor and back is arched. Do not raise hips. Hold _3___ seconds. Return to prone position. Repeat _10___ times.  Trunk Extension    Standing, place back of open hands on low back. Straighten spine then arch the back and move shoulders back.  Repeat __5__ times per session.   KNEE: Quadriceps - Prone    Place strap around ankle. Bring ankle toward buttocks. Press hip into surface. Hold _30__ seconds. _2__ reps per set,  [place] [facility] [place], [place] [address]  [phone] (office) [phone] (fax)

## 2016-06-29 ENCOUNTER — Encounter: Payer: Self-pay | Admitting: Rehabilitative and Restorative Service Providers"

## 2016-06-29 ENCOUNTER — Ambulatory Visit (INDEPENDENT_AMBULATORY_CARE_PROVIDER_SITE_OTHER): Payer: 59 | Admitting: Rehabilitative and Restorative Service Providers"

## 2016-06-29 DIAGNOSIS — R29898 Other symptoms and signs involving the musculoskeletal system: Secondary | ICD-10-CM | POA: Diagnosis not present

## 2016-06-29 DIAGNOSIS — M545 Low back pain, unspecified: Secondary | ICD-10-CM

## 2016-06-29 DIAGNOSIS — R293 Abnormal posture: Secondary | ICD-10-CM | POA: Diagnosis not present

## 2016-06-29 DIAGNOSIS — R531 Weakness: Secondary | ICD-10-CM | POA: Diagnosis not present

## 2016-06-29 NOTE — Therapy (Signed)
[place], [name], [address] Phone: [phone]   Fax:  [phone]  Physical Therapy Treatment  Patient Details  Name: Andrew Gray MRN: [id number] Date of Birth: 01/14/1981 Referring Provider: Dr. Dianah Field  Encounter Date: 06/29/2016      Gray End of Session - 06/29/16 1332    Visit Number 3   Number of Visits 12   Date for Gray Re-Evaluation 07/27/16   Gray Start Time 1330   Gray Stop Time 1410   Gray Time Calculation (min) 40 min   Activity Tolerance Patient tolerated treatment well      History reviewed. No pertinent past medical history.  History reviewed. No pertinent surgical history.  There were no vitals filed for this visit.      Subjective Assessment - 06/29/16 1612    Subjective Continues to improve - feels that he is about 50% better overall. He has less LBP and noted decreased frequency and duration of radicular pain into the Lt LE. He had some flare up of symptoms yesterday - not sure why. Feels that the extension has really helped. He is working on his exercise program and can tell that the lumbar spine has some improved segmental mobility.    Currently in Pain? No/denies   Pain Location Back   Pain Orientation Left;Lower   Pain Descriptors / Indicators Aching   Pain Type Chronic pain   Pain Radiating Towards Lt calf - no longer having pain in the lateral thigh notes some in the Lt buttock   Pain Onset More than a month ago   Pain Frequency Intermittent                         [facility] Adult Gray Treatment/Exercise - 06/29/16 0001      Neuro Re-ed    Neuro Re-ed Details  working on neutral spine posture with standing and sitting      Lumbar Exercises: Stretches   Press Ups --  10 reps 2-3 sec hold    Press Ups Limitations exhale with sag      Lumbar Exercises: Seated   Sit to Stand Limitations sitting on treatment table working on ant/post pelvic tilt with  focus on anterior tilt      Lumbar Exercises: Quadruped   Madcat/Old Horse 10 reps     Moist Heat Therapy   Number Minutes Moist Heat 10 Minutes   Moist Heat Location Lumbar Spine;Hip  Lt      Acupuncturist Location Lt lumbar spine; Lt hip    Electrical Stimulation Action IFC   Electrical Stimulation Parameters to tolerance   Electrical Stimulation Goals Pain;Tone     Manual Therapy   Manual therapy comments Gray prone    Joint Mobilization CPA mobs L2/3/4/5; UPA mobs L3/4/5 Grade III    Soft tissue mobilization Lt lumbar musculature - paraspinals/QL/multifidi; Lt piriformis/abduction           Trigger Point Dry Needling - 06/29/16 1620    Consent Given? Yes   Muscles Treated Lower Body --  Lt deep paraspinals/multifidi - decreased tightness/twitch   Gluteus Maximus Response Palpable increased muscle length;Twitch response elicited  Lt - glut medius    Piriformis Response Palpable increased muscle length;Twitch response elicited                   Gray Long Term Goals - 06/22/16 1423  Gray LONG TERM GOAL #1   Title Improve posture and alignment with patient to demonstrate upright posture without lateral deviation and with neutral spine position 07/27/16   Time 6   Period Weeks   Status On-going     Gray LONG TERM GOAL #2   Title Patient to report standing and walking for 30-45 min without pain 07/27/16   Time 6   Period Weeks   Status On-going     Gray LONG TERM GOAL #3   Title Improve core strength and stabilty with patient to report/demonstrate good core contraction with stabilization program 07/27/16   Time 6   Period Weeks   Status On-going     Gray LONG TERM GOAL #4   Title Independent with HEP 07/27/16   Time 6   Period Weeks   Status On-going     Gray LONG TERM GOAL #5   Title Improve FOTO to </= 28% limitation 07/27/16   Time 6   Period Weeks   Status On-going               Plan - 06/29/16 1623     Clinical Impression Statement Continued improvement with extension program to Gray tolerance. Responded well to spinal mobs and DN - added DN to Lt piriformis/glut medius today with some report of soreness. He has decreased back pain and improving Lt LE radicular symptoms. Needs continued neuromuscular re-education re neutral spine for sititng and standing. Discussed trial of lumbar support for car and desk/home. Progressing well toward goals of therapy. May benefit from work on thoracic extension.    Rehab Potential Good   Gray Frequency 1x / week   Gray Duration 6 weeks   Gray Treatment/Interventions Patient/family education;Neuromuscular re-education;ADLs/Self Care Home Management;Cryotherapy;Electrical Stimulation;Iontophoresis 4mg /ml Dexamethasone;Moist Heat;Traction;Ultrasound;Dry needling;Manual techniques;Therapeutic activities;Therapeutic exercise   Gray Next Visit Plan good response to DN and lumbar/thoracic mobs; progress with core stabilization and spinal mobility/stability exercises; DN and manual work as indicated; modalities - address thoracic extension continue work on lumbar extension avoiding other planes of motioin for now with focus on A/P movement to improve segmental mobility    Consulted and Agree with Plan of Care Patient      Patient will benefit from skilled therapeutic intervention in order to improve the following deficits and impairments:  Postural dysfunction, Improper body mechanics, Pain, Increased fascial restricitons, Increased muscle spasms, Decreased mobility, Decreased activity tolerance  Visit Diagnosis: Acute left-sided low back pain without sciatica  Abnormal posture  Weakness generalized  Other symptoms and signs involving the musculoskeletal system     Problem List Patient Active Problem List   Diagnosis Date Noted  . Abnormal weight gain 02/23/2015  . Hyperlipidemia 02/23/2015  . Transaminitis 02/23/2015  . Essential hypertension, benign 02/23/2015  .  Annual physical exam 02/16/2015  . Gout 02/16/2015  . Spondylolisthesis at L5-S1 level 02/15/2015    Andrew Gray, MPH  06/29/2016, 4:29 PM  [facility] [place], [name], [address] Phone: [phone]   Fax:  [phone]  Name: Andrew Gray MRN: [id number] Date of Birth: 01-20-1981

## 2016-07-06 ENCOUNTER — Ambulatory Visit (INDEPENDENT_AMBULATORY_CARE_PROVIDER_SITE_OTHER): Payer: 59 | Admitting: Physical Therapy

## 2016-07-06 ENCOUNTER — Encounter: Payer: Self-pay | Admitting: Physical Therapy

## 2016-07-06 DIAGNOSIS — M545 Low back pain, unspecified: Secondary | ICD-10-CM

## 2016-07-06 DIAGNOSIS — R531 Weakness: Secondary | ICD-10-CM

## 2016-07-06 DIAGNOSIS — R29898 Other symptoms and signs involving the musculoskeletal system: Secondary | ICD-10-CM | POA: Diagnosis not present

## 2016-07-06 DIAGNOSIS — G8929 Other chronic pain: Secondary | ICD-10-CM

## 2016-07-06 DIAGNOSIS — M5441 Lumbago with sciatica, right side: Secondary | ICD-10-CM

## 2016-07-06 DIAGNOSIS — R293 Abnormal posture: Secondary | ICD-10-CM | POA: Diagnosis not present

## 2016-07-06 NOTE — Therapy (Signed)
[place], [name], [address] Phone: [phone]   Fax:  [phone]  Physical Therapy Treatment  Patient Details  Name: Andrew Gray MRN: [id number] Date of Birth: Aug 03, 1980 Referring Provider: Dr. Dianah Field  Encounter Date: 07/06/2016      PT End of Session - 07/06/16 1331    Visit Number 4   Number of Visits 12   Date for PT Re-Evaluation 07/27/16   PT Start Time 1331   PT Stop Time [id number]   PT Time Calculation (min) 51 min   Activity Tolerance Patient tolerated treatment well      History reviewed. No pertinent past medical history.  History reviewed. No pertinent surgical history.  There were no vitals filed for this visit.      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 No/denies            [facility] PT Assessment - 07/06/16 0001      Strength   Overall Strength Comments --  some weakness in the Lt glut med -                      [facility] PT Treatment/Exercise - 07/06/16 0001      Lumbar Exercises: Stretches   Press Ups --  with manual overpressure in lumbar area   Piriformis Stretch 30 seconds     Lumbar Exercises: Supine   Bridge --  with green, then black band with clams, & ER to fatigue     Lumbar Exercises: Sidelying   Clam --  reverse clam with tight green band to fatigue     Modalities   Modalities Electrical Stimulation;Moist Heat     Moist Heat Therapy   Number Minutes Moist Heat 15 Minutes   Moist Heat Location Lumbar Spine;Hip     Electrical Stimulation   Electrical Stimulation Location Lt lumbar and buttocks   Electrical Stimulation Action IFC    Electrical Stimulation Parameters to toleracne   Electrical Stimulation Goals Pain;Tone     Manual Therapy   Manual Therapy Soft tissue mobilization;Joint mobilization   Manual therapy comments pt prone    Joint Mobilization CPA mobs L4-1 grade III then with over pressure and active pt press ups, UPA mobs grade III Lt L4-3   Soft tissue mobilization STW into Lt lumbar paraspinals, QL and buttocks, slight tightness in the Lt pirifiromis and obterator muscles. - small banding.           Trigger Point Dry Needling - 07/06/16 1401    Consent Given? Yes   Education Handout Provided No   Muscles Treated Upper Body Quadratus Lumborum  twitch response                   PT Long Term Goals - 06/22/16 1423      PT LONG TERM GOAL #1   Title Improve posture and alignment with patient to demonstrate upright posture without lateral deviation and with neutral spine position 07/27/16   Time 6   Period Weeks   Status On-going     PT LONG TERM GOAL #2   Title Patient to report standing and walking for 30-45  min without pain 07/27/16   Time 6   Period Weeks   Status On-going     PT LONG TERM GOAL #3   Title Improve core strength and stabilty with patient to report/demonstrate good core contraction with stabilization program 07/27/16   Time 6   Period Weeks   Status On-going     PT LONG TERM GOAL #4   Title Independent with HEP 07/27/16   Time 6   Period Weeks   Status On-going     PT LONG TERM GOAL #5   Title Improve FOTO to </= 28% limitation 07/27/16   Time 6   Period Weeks   Status On-going               Plan - 07/06/16 1452    Clinical Impression Statement Piriformis muscle fatigued with ther ex and resistive bands.  These given as HEP to work this area.  His muscles are having decreased tightness and tenderness.  Still some in the Lt QL, piriformis,  Held on DN to the piriformis today, will resume if needed at next visit.  Tolerated DN to Lt QL well.  Makingprgress to goals.    Rehab Potential Good   PT Frequency 1x / week   PT Duration 6 weeks   PT Treatment/Interventions Patient/family education;Neuromuscular re-education;ADLs/Self Care Home Management;Cryotherapy;Electrical Stimulation;Iontophoresis 4mg /ml Dexamethasone;Moist Heat;Traction;Ultrasound;Dry needling;Manual techniques;Therapeutic activities;Therapeutic exercise   PT Next Visit Plan cont manual work PRN, pt wants HEP pared down to priority exercises.    Consulted and Agree with Plan of Care Patient      Patient will benefit from skilled therapeutic intervention in order to improve the following deficits and impairments:  Postural dysfunction, Improper body mechanics, Pain, Increased fascial restricitons, Increased muscle spasms, Decreased mobility, Decreased activity tolerance  Visit Diagnosis: Acute left-sided low back pain without sciatica  Abnormal posture  Weakness generalized  Other symptoms and signs involving the musculoskeletal system  Chronic right-sided low back pain with right-sided sciatica     Problem List Patient Active Problem List   Diagnosis Date Noted  . Abnormal weight gain 02/23/2015  . Hyperlipidemia 02/23/2015  . Transaminitis 02/23/2015  . Essential hypertension, benign 02/23/2015  . Annual physical exam 02/16/2015  . Gout 02/16/2015  . Spondylolisthesis at L5-S1 level 02/15/2015    Jeral Pinch PT  07/06/2016, 2:57 PM  [facility] [place], [name], [address] Phone: [phone]   Fax:  [phone]  Name: Andrew Gray MRN: [id number] Date of Birth: [birth year]

## 2016-07-13 ENCOUNTER — Ambulatory Visit (INDEPENDENT_AMBULATORY_CARE_PROVIDER_SITE_OTHER): Payer: 59 | Admitting: Rehabilitative and Restorative Service Providers"

## 2016-07-13 DIAGNOSIS — M545 Low back pain, unspecified: Secondary | ICD-10-CM

## 2016-07-13 DIAGNOSIS — R29898 Other symptoms and signs involving the musculoskeletal system: Secondary | ICD-10-CM | POA: Diagnosis not present

## 2016-07-13 DIAGNOSIS — R293 Abnormal posture: Secondary | ICD-10-CM | POA: Diagnosis not present

## 2016-07-13 DIAGNOSIS — R531 Weakness: Secondary | ICD-10-CM | POA: Diagnosis not present

## 2016-07-13 NOTE — Therapy (Signed)
[facility] [address] [place] [address] [place], [place], [address] Phone: [phone]   Fax:  [phone]  Physical Therapy Treatment  Patient Details  Name: Andrew Gray MRN: [id number] Date of Birth: 1981-04-21 Referring Provider: dr Benjamin Stain  Encounter Date: 07/13/2016      PT End of Session - 07/13/16 1427    Visit Number 5   Number of Visits 12   Date for PT Re-Evaluation 07/27/16   PT Start Time 1329   PT Stop Time 1420   PT Time Calculation (min) 51 min   Activity Tolerance Patient tolerated treatment well      No past medical history on file.  No past surgical history on file.  There were no vitals filed for this visit.          [facility] PT Assessment - 07/13/16 0001      Assessment   Medical Diagnosis DDD lumbar spine    Referring Provider dr Benjamin Stain   Onset Date/Surgical Date 04/20/16   Hand Dominance Right   Next MD Visit PRN     Sensation   Additional Comments intermittent tingling and numbness lateral Lt calf      Posture/Postural Control   Posture Comments inmproving posture and alignment with less head forward; shoulders rounded and elevated; increased thoracic kyphosis; decreased lumbar lordosis     AROM   Lumbar Extension 60%                     [facility] Adult PT Treatment/Exercise - 07/13/16 0001      Neuro Re-ed    Neuro Re-ed Details  working on neutral spine posture with standing w/ focus on posterior shoulder girdle engagement      Lumbar Exercises: Stretches   Press Ups --  2-3 sec hold x 10 w/overpressure & w/ strap across lumbar      Lumbar Exercises: Standing   Scapular Retraction Strengthening;Both;10 reps  w/ noodle for posterior shoudler girdle engagement    Theraband Level (Scapular Retraction) Level 1 (Yellow)   Row --  trial of rowing w/ posterior shoulder girdle engaged/unable    Other Standing Lumbar Exercises scap squeeze w/ noodle 10 sec x 10       Lumbar Exercises: Supine   Other Supine Lumbar Exercises lying supine over yoga egg/3 inch noodle to encourage thoracic extension      Lumbar Exercises: Quadruped   [place] Horse 10 reps     Manual Therapy   Manual Therapy Soft tissue mobilization;Joint mobilization   Manual therapy comments pt prone    Joint Mobilization CPA mobs L4-L1 grade III, UPA mobs grade III Lt L4-3   Soft tissue mobilization Lt lumbar paraspinals, QL, continued tightness Lt pirifiromis musclature w/ some banding noted           Trigger Point Dry Needling - 07/13/16 1435    Consent Given? Yes   Muscles Treated Lower Body --  Lt deep paraspinals/multifidi - twich/palpable dec tightness   Gluteus Maximus Response Palpable increased muscle length   Piriformis Response Palpable increased muscle length              PT Education - 07/13/16 1414    Education provided Yes   Education Details HEP    Person(s) Educated Patient   Methods Explanation;Demonstration;Tactile cues;Verbal cues;Handout   Comprehension Verbalized understanding;Returned demonstration;Verbal cues required;Tactile cues required             PT Long Term Goals - 07/13/16 1432  PT LONG TERM GOAL #1   Title Improve posture and alignment with patient to demonstrate upright posture without lateral deviation and with neutral spine position 07/27/16   Time 6   Status Partially Met     PT LONG TERM GOAL #2   Title Patient to report standing and walking for 30-45 min without pain 07/27/16   Time 6   Period Weeks   Status On-going     PT LONG TERM GOAL #3   Title Improve core strength and stabilty with patient to report/demonstrate good core contraction with stabilization program 07/27/16   Time 6   Period Weeks   Status On-going     PT LONG TERM GOAL #4   Title Independent with HEP 07/27/16   Time 6   Period Weeks   Status On-going     PT LONG TERM GOAL #5   Title Improve FOTO to </= 28% limitation 07/27/16   Time 6    Period Weeks   Status On-going               Plan - 07/13/16 1427    Clinical Impression Statement Increased LBP and Lt LE radicular pain in the past 2 days with no known reason. Responded well to DN and mobilization followed by exercise. Added work for thoracic extension including passive stretch in supine and neuromuscular re-education for posterior shoulder control. Good response with improved thoracic extension and therefore lumbar extension noted with standing. continued progress toward stated goals of therapy.    Rehab Potential Good   PT Frequency 1x / week   PT Duration 6 weeks   PT Treatment/Interventions Patient/family education;Neuromuscular re-education;ADLs/Self Care Home Management;Cryotherapy;Electrical Stimulation;Iontophoresis '4mg'$ /ml Dexamethasone;Moist Heat;Traction;Ultrasound;Dry needling;Manual techniques;Therapeutic activities;Therapeutic exercise   PT Next Visit Plan continue DN; manual work; postural correction; neuromuscular re-education; madalities as indicated. Discussed priority for exercises    Consulted and Agree with Plan of Care Patient      Patient will benefit from skilled therapeutic intervention in order to improve the following deficits and impairments:  Postural dysfunction, Improper body mechanics, Pain, Increased fascial restricitons, Increased muscle spasms, Decreased mobility, Decreased activity tolerance  Visit Diagnosis: Acute left-sided low back pain without sciatica  Abnormal posture  Weakness generalized  Other symptoms and signs involving the musculoskeletal system     Problem List Patient Active Problem List   Diagnosis Date Noted  . Abnormal weight gain 02/23/2015  . Hyperlipidemia 02/23/2015  . Transaminitis 02/23/2015  . Essential hypertension, benign 02/23/2015  . Annual physical exam 02/16/2015  . Gout 02/16/2015  . Spondylolisthesis at L5-S1 level 02/15/2015    Javad Salva Nilda Simmer PT, MPH  07/13/2016, 2:36 PM  [facility] [place], [name], [address] Phone: [phone]   Fax:  [phone]  Name: Andrew Gray MRN: [id number] Date of Birth: Mar 16, 1981

## 2016-07-13 NOTE — Patient Instructions (Signed)
Shoulder Blade Squeeze   Noodle behind back  Rotate shoulders back, then squeeze shoulder blades down and back. Hold 10-20 sec Repeat __10__ times. Do _several___ sessions per day.   Scapula Adduction With Pectoralis Stretch: Low - Standing   Shoulders at 45 hands even with shoulders, keeping weight through legs, shift weight forward until you feel pull or stretch through the front of your chest. Hold _30__ seconds. Do _3__ times, _2-4__ times per day.   Scapula Adduction With Pectoralis Stretch: Mid-Range - Standing   Shoulders at 90 elbows even with shoulders, keeping weight through legs, shift weight forward until you feel pull or strength through the front of your chest. Hold __30_ seconds. Do _3__ times, __2-4_ times per day.   Scapula Adduction With Pectoralis Stretch: High - Standing   Shoulders at 120 hands up high on the doorway, keeping weight on feet, shift weight forward until you feel pull or stretch through the front of your chest. Hold _30__ seconds. Do _3__ times, _2-3__ times per day.   Resisted External Rotation: in Neutral - Bilateral   PALMS UP Sit or stand, tubing in both hands, elbows at sides, bent to 90, forearms forward. Pinch shoulder blades together and rotate forearms out. Keep elbows at sides. Repeat __10__ times per set. Do _2-3___ sets per session. Do _2-3___ sessions per day.  Lying on back noodle along spine across different area

## 2016-07-20 ENCOUNTER — Ambulatory Visit (INDEPENDENT_AMBULATORY_CARE_PROVIDER_SITE_OTHER): Payer: 59 | Admitting: Rehabilitative and Restorative Service Providers"

## 2016-07-20 ENCOUNTER — Encounter: Payer: Self-pay | Admitting: Rehabilitative and Restorative Service Providers"

## 2016-07-20 DIAGNOSIS — R29898 Other symptoms and signs involving the musculoskeletal system: Secondary | ICD-10-CM | POA: Diagnosis not present

## 2016-07-20 DIAGNOSIS — M545 Low back pain, unspecified: Secondary | ICD-10-CM

## 2016-07-20 DIAGNOSIS — R293 Abnormal posture: Secondary | ICD-10-CM | POA: Diagnosis not present

## 2016-07-20 DIAGNOSIS — R531 Weakness: Secondary | ICD-10-CM | POA: Diagnosis not present

## 2016-07-20 NOTE — Therapy (Signed)
[place], [name], [address] Phone: [phone]   Fax:  [phone]  Physical Therapy Treatment  Patient Details  Name: Andrew Gray MRN: [id number] Date of Birth: Feb 01, 1981 Referring Provider: Dr Dianah Field  Encounter Date: 07/20/2016      PT End of Session - 07/20/16 1423    Visit Number 6   Number of Visits 12   Date for PT Re-Evaluation 07/27/16   PT Start Time 1332   PT Stop Time 1418   PT Time Calculation (min) 46 min   Activity Tolerance Patient tolerated treatment well      History reviewed. No pertinent past medical history.  History reviewed. No pertinent surgical history.  There were no vitals filed for this visit.      Subjective Assessment - 07/20/16 1423    Subjective Very little back pain now. Still intermittent Lt lateral calf pain. Variable length of time in standing before onset of symptoms. Notes some improvement. Likes the thoracic spine mobiliization and stretching.    Currently in Pain? No/denies            [facility] PT Assessment - 07/20/16 0001      Assessment   Medical Diagnosis DDD lumbar spine    Referring Provider Dr Dianah Field   Onset Date/Surgical Date 04/20/16   Hand Dominance Right   Next MD Visit PRN     Sensation   Additional Comments intermittent tingling and numbness lateral Lt calf      Posture/Postural Control   Posture Comments continued improvement in posture and alignment with less head forward; shoulders rounded and elevated; increased thoracic kyphosis; decreased lumbar lordosis     AROM   Lumbar Extension 70%                      [facility] Adult PT Treatment/Exercise - 07/20/16 0001      Lumbar Exercises: Stretches   Press Ups --  2-3 sec hold x 10 w/overpressure & w/ strap across lumbar      Lumbar Exercises: Standing   Other Standing Lumbar Exercises scap squeeze w/ noodle 10 sec x 10; standing shd flexion  stretch stepping under doorway 30-45 sec x 3    Other Standing Lumbar Exercises hip flexor stretch ant hips at counter extending Lt LE toe on floor then pressing heel to floor 30-45 sec hold x 3      Lumbar Exercises: Seated   Hip Flexion on Ball Limitations seated hip flexor stretch 30-45 sec x 2 reps      Manual Therapy   Manual Therapy Soft tissue mobilization;Joint mobilization   Manual therapy comments pt prone    Soft tissue mobilization Lt/Rt lumbar paraspinals, QL, improved tightness Lt pirifiromis musclature; working through the lateral Lt calf fibularius musculature           Trigger Point Dry Needling - 07/20/16 1429    Consent Given? Yes   Muscles Treated Lower Body --  lumbar paraspinals/multifidi Lt/Rt - dec palpable tightness               PT Education - 07/20/16 1430    Education provided Yes   Education Details HEP    Person(s) Educated Patient   Methods Explanation;Demonstration;Tactile cues;Verbal cues;Handout   Comprehension Verbalized understanding;Returned demonstration;Verbal cues required;Tactile cues required             PT Long Term Goals - 07/20/16 1435      PT LONG TERM  GOAL #1   Title Improve posture and alignment with patient to demonstrate upright posture without lateral deviation and with neutral spine position 07/27/16   Time 6   Period Weeks   Status Partially Met     PT LONG TERM GOAL #2   Title Patient to report standing and walking for 30-45 min without pain 07/27/16   Time 6   Period Weeks   Status On-going     PT LONG TERM GOAL #3   Title Improve core strength and stabilty with patient to report/demonstrate good core contraction with stabilization program 07/27/16   Time 6   Period Weeks   Status On-going     PT LONG TERM GOAL #4   Title Independent with HEP 07/27/16   Time 6   Period Weeks   Status On-going     PT LONG TERM GOAL #5   Title Improve FOTO to </= 28% limitation 07/27/16   Time 6   Period Weeks    Status On-going               Plan - 07/20/16 1432    Clinical Impression Statement Continued improvement reported. Little LBP and Lt hip pain. Continues to experience intermittent Lt lateral calf pain. Good response to DN - trial of DN to Lt lateral calf and Rt/Lt lumbar. Added hip flexor stretch in sitting and standing. Continues to progress toward stated goals of therapy.    Rehab Potential Good   PT Frequency 1x / week   PT Duration 6 weeks   PT Treatment/Interventions Patient/family education;Neuromuscular re-education;ADLs/Self Care Home Management;Cryotherapy;Electrical Stimulation;Iontophoresis 48m/ml Dexamethasone;Moist Heat;Traction;Ultrasound;Dry needling;Manual techniques;Therapeutic activities;Therapeutic exercise   PT Next Visit Plan continue DN; manual work; postural correction; neuromuscular re-education; madalities as indicated.    Consulted and Agree with Plan of Care Patient      Patient will benefit from skilled therapeutic intervention in order to improve the following deficits and impairments:  Postural dysfunction, Improper body mechanics, Pain, Increased fascial restricitons, Increased muscle spasms, Decreased mobility, Decreased activity tolerance  Visit Diagnosis: Acute left-sided low back pain without sciatica  Abnormal posture  Weakness generalized  Other symptoms and signs involving the musculoskeletal system     Problem List Patient Active Problem List   Diagnosis Date Noted  . Abnormal weight gain 02/23/2015  . Hyperlipidemia 02/23/2015  . Transaminitis 02/23/2015  . Essential hypertension, benign 02/23/2015  . Annual physical exam 02/16/2015  . Gout 02/16/2015  . Spondylolisthesis at L5-S1 level 02/15/2015    Andrew Gray PNilda SimmerPT, MPH  07/20/2016, 2:36 PM  [facility] [address]Phone: [phone]  Fax:  [phone] Name: Andrew NILLMRN:  [id number] of Birth: 202-09-1980

## 2016-07-25 MED FILL — VYVANSE 20 MG CAPSULE: 20 | 30 days supply | Qty: 30 | Fill #0

## 2016-07-27 ENCOUNTER — Ambulatory Visit (INDEPENDENT_AMBULATORY_CARE_PROVIDER_SITE_OTHER): Payer: 59 | Admitting: Rehabilitative and Restorative Service Providers"

## 2016-07-27 ENCOUNTER — Encounter: Payer: Self-pay | Admitting: Rehabilitative and Restorative Service Providers"

## 2016-07-27 DIAGNOSIS — R531 Weakness: Secondary | ICD-10-CM | POA: Diagnosis not present

## 2016-07-27 DIAGNOSIS — M545 Low back pain, unspecified: Secondary | ICD-10-CM

## 2016-07-27 DIAGNOSIS — R293 Abnormal posture: Secondary | ICD-10-CM | POA: Diagnosis not present

## 2016-07-27 DIAGNOSIS — R29898 Other symptoms and signs involving the musculoskeletal system: Secondary | ICD-10-CM | POA: Diagnosis not present

## 2016-07-27 NOTE — Therapy (Signed)
[place], [name], [address] Phone: [phone]   Fax:  [phone]  Physical Therapy Treatment  Patient Details  Name: Andrew Gray MRN: [id number] Date of Birth: 1980/11/15 Referring Provider: Dr Dianah Field  Encounter Date: 07/27/2016      PT End of Session - 07/27/16 1333    Visit Number 7   Number of Visits 12   Date for PT Re-Evaluation 07/27/16   PT Start Time 1332   PT Stop Time 1357   PT Time Calculation (min) 25 min   Activity Tolerance Patient tolerated treatment well      History reviewed. No pertinent past medical history.  History reviewed. No pertinent surgical history.  There were no vitals filed for this visit.      Subjective Assessment - 07/27/16 1353    Subjective Very little back pain now. Still intermittent Lt lateral calf pain but less noticable in the past week. Standing tolerance is increased. Pleased with progress. Feels he is ~50% better overall.  Will return in 2 weeks. Considering decreasing the frequency of therapy. Will work on home porgram. Forgot the hip flexor stretch in sitting but will add that into the routine.    Currently in Pain? No/denies            [facility] PT Assessment - 07/27/16 0001      Assessment   Medical Diagnosis DDD lumbar spine    Referring Provider Dr Dianah Field   Onset Date/Surgical Date 04/20/16   Hand Dominance Right   Next MD Visit PRN     Sensation   Additional Comments intermittent tingling and numbness lateral Lt calf decreased frequency      Posture/Postural Control   Posture Comments continued improvement in posture and alignment with less head forward; shoulders rounded and elevated; increased thoracic kyphosis; decreased lumbar lordosis     AROM   Lumbar Extension 75%                                  PT Long Term Goals - 07/27/16 1401      PT LONG TERM GOAL #1   Title Improve  posture and alignment with patient to demonstrate upright posture without lateral deviation and with neutral spine position with good thoracic extension  09/07/16   Time 12   Period Weeks   Status Revised     PT LONG TERM GOAL #2   Title Patient to report standing and walking for 30-45 min without pain 09/07/16   Time 12   Period Weeks   Status Revised     PT LONG TERM GOAL #3   Title Improve core strength and stabilty with patient to report/demonstrate good core contraction with stabilization program 09/07/16   Time 12   Period Weeks   Status Revised     PT LONG TERM GOAL #4   Title Independent with HEP 09/07/16   Time 12   Period Weeks   Status Revised     PT LONG TERM GOAL #5   Title Improve FOTO to </= 28% limitation 09/07/16   Time 12   Period Weeks   Status On-going               Plan - 07/27/16 1358    Clinical Impression Statement Continued improvement. Patient has decreased intensity, frequency, duration of symtpoms. He is now standing for functional periods of time. Working on  exercises. Good trunk extension noted with exercise. He has improved posture and alignment; decreased muscular tightness to palpation through the lumbar spine and Lt piriformis. Excellent progress.    Rehab Potential Good   PT Frequency 1x / week   PT Duration 12 weeks   PT Treatment/Interventions Patient/family education;Neuromuscular re-education;ADLs/Self Care Home Management;Cryotherapy;Electrical Stimulation;Iontophoresis 4mg /ml Dexamethasone;Moist Heat;Traction;Ultrasound;Dry needling;Manual techniques;Therapeutic activities;Therapeutic exercise   PT Next Visit Plan continue DN; manual work; postural correction; neuromuscular re-education; madalities as indicated. Stretch hip flexors. Strengthen core    Consulted and Agree with Plan of Care Patient      Patient will benefit from skilled therapeutic intervention in order to improve the following deficits and impairments:  Postural  dysfunction, Improper body mechanics, Pain, Increased fascial restricitons, Increased muscle spasms, Decreased mobility, Decreased activity tolerance  Visit Diagnosis: Acute left-sided low back pain without sciatica - Plan: PT plan of care cert/re-cert  Abnormal posture - Plan: PT plan of care cert/re-cert  Weakness generalized - Plan: PT plan of care cert/re-cert  Other symptoms and signs involving the musculoskeletal system - Plan: PT plan of care cert/re-cert     Problem List Patient Active Problem List   Diagnosis Date Noted  . Abnormal weight gain 02/23/2015  . Hyperlipidemia 02/23/2015  . Transaminitis 02/23/2015  . Essential hypertension, benign 02/23/2015  . Annual physical exam 02/16/2015  . Gout 02/16/2015  . Spondylolisthesis at L5-S1 level 02/15/2015    Celyn Nilda Simmer PT, MPH  07/27/2016, 2:08 PM  [facility] [place], [name], [address] Phone: [phone]   Fax:  [phone]  Name: Andrew Gray MRN: [id number] Date of Birth: 10-03-1980

## 2016-08-06 ENCOUNTER — Other Ambulatory Visit: Payer: Self-pay | Admitting: Sports Medicine

## 2016-08-06 DIAGNOSIS — M109 Gout, unspecified: Secondary | ICD-10-CM

## 2016-08-06 DIAGNOSIS — E291 Testicular hypofunction: Secondary | ICD-10-CM

## 2016-08-06 MED ORDER — SILDENAFIL CITRATE 20 MG PO TABS
20.0000 mg | ORAL_TABLET | ORAL | 11 refills | Status: DC | PRN
Start: 2016-08-06 — End: 2017-11-01

## 2016-08-07 ENCOUNTER — Other Ambulatory Visit: Payer: Self-pay | Admitting: Sports Medicine

## 2016-08-07 DIAGNOSIS — M109 Gout, unspecified: Secondary | ICD-10-CM | POA: Diagnosis not present

## 2016-08-07 DIAGNOSIS — E291 Testicular hypofunction: Secondary | ICD-10-CM

## 2016-08-08 LAB — BASIC METABOLIC PANEL
BUN: 11 mg/dL (ref 4–21)
Creatinine: 0.8 mg/dL (ref 0.6–1.3)
GLUCOSE: 93 mg/dL
Potassium: 4.6 mmol/L (ref 3.4–5.3)
SODIUM: 137 mmol/L (ref 137–147)

## 2016-08-08 LAB — LIPID PANEL
CHOLESTEROL: 193 mg/dL (ref 0–200)
HDL: 49 mg/dL (ref 35–70)
LDL CALC: 125 mg/dL
LDl/HDL Ratio: 3.9
Triglycerides: 95 mg/dL (ref 40–160)

## 2016-08-08 LAB — HEPATIC FUNCTION PANEL
ALT: 46 U/L — AB (ref 10–40)
AST: 33 U/L (ref 14–40)
Alkaline Phosphatase: 43 U/L (ref 25–125)
Bilirubin, Total: 0.8 mg/dL

## 2016-08-08 LAB — PSA: PSA: 0.04

## 2016-08-08 LAB — CBC AND DIFFERENTIAL: Hemoglobin: 14.3 g/dL (ref 13.5–17.5)

## 2016-08-09 ENCOUNTER — Other Ambulatory Visit: Payer: Self-pay | Admitting: Sports Medicine

## 2016-08-09 DIAGNOSIS — M1A0790 Idiopathic chronic gout, unspecified ankle and foot, without tophus (tophi): Secondary | ICD-10-CM

## 2016-08-09 LAB — TESTOSTERONE TOTAL,FREE,BIO, MALES
Albumin: 4.7 g/dL (ref 3.6–5.1)
Sex Hormone Binding: 26 nmol/L (ref 10–50)
Testosterone, Bioavailable: 244.3 ng/dL (ref 110.0–575.0)
Testosterone, Free: 114 pg/mL (ref 46.0–224.0)
Testosterone: 669 ng/dL (ref 250–827)

## 2016-08-09 LAB — VITAMIN D 25 HYDROXY (VIT D DEFICIENCY, FRACTURES): Vit D, 25-Hydroxy: 37 ng/mL (ref 30–100)

## 2016-08-09 LAB — URIC ACID: Uric Acid, Serum: 6 mg/dL (ref 4.0–8.0)

## 2016-08-09 MED ORDER — FEBUXOSTAT 80 MG PO TABS: 1.0000 | ORAL_TABLET | Freq: Every day | ORAL | 3 refills | Status: DC

## 2016-08-09 NOTE — Assessment & Plan Note (Signed)
Uric acid level is still at 6, does get occasional widespread arthralgias, increasing to 80 mg of Uloric.

## 2016-08-10 ENCOUNTER — Encounter: Payer: Self-pay | Admitting: Rehabilitative and Restorative Service Providers"

## 2016-08-17 ENCOUNTER — Encounter: Payer: Self-pay | Admitting: Rehabilitative and Restorative Service Providers"

## 2016-08-17 ENCOUNTER — Telehealth: Payer: Self-pay | Admitting: Sports Medicine

## 2016-08-17 DIAGNOSIS — F419 Anxiety disorder, unspecified: Secondary | ICD-10-CM | POA: Diagnosis not present

## 2016-08-17 DIAGNOSIS — Z79899 Other long term (current) drug therapy: Secondary | ICD-10-CM | POA: Diagnosis not present

## 2016-08-17 DIAGNOSIS — F902 Attention-deficit hyperactivity disorder, combined type: Secondary | ICD-10-CM | POA: Diagnosis not present

## 2016-08-17 MED ORDER — GABAPENTIN 300 MG PO CAPS: ORAL_CAPSULE | ORAL | 3 refills | Status: DC

## 2016-08-17 MED ORDER — PREDNISONE 50 MG PO TABS: ORAL_TABLET | ORAL | 0 refills | Status: DC

## 2016-08-17 MED ORDER — CYCLOBENZAPRINE HCL 10 MG PO TABS: ORAL_TABLET | ORAL | 3 refills | Status: DC

## 2016-08-17 MED FILL — predniSONE 50 MG TABS: 50 | 5 days supply | Qty: 5 | Fill #0

## 2016-08-17 MED FILL — CYCLOBENZAPRINE 10 MG TAB: 10 | 10 days supply | Qty: 30 | Fill #0

## 2016-08-17 MED FILL — GABAPENTIN 300 MG CAPSULE: 300 | 30 days supply | Qty: 60 | Fill #0

## 2016-08-17 NOTE — Telephone Encounter (Signed)
Lumbar degenerative disc disease with spondylolisthesis, adding gabapentin, Flexeril, prednisone to be used on a trip coming up should he need it.

## 2016-08-21 ENCOUNTER — Encounter: Payer: Self-pay | Admitting: Sports Medicine

## 2016-08-23 ENCOUNTER — Other Ambulatory Visit: Payer: Self-pay | Admitting: Sports Medicine

## 2016-08-23 DIAGNOSIS — M1A0790 Idiopathic chronic gout, unspecified ankle and foot, without tophus (tophi): Secondary | ICD-10-CM

## 2016-08-23 MED ORDER — FEBUXOSTAT 80 MG PO TABS: 1.0000 | ORAL_TABLET | Freq: Every day | ORAL | 3 refills | Status: DC

## 2016-08-23 MED FILL — LISINOPRIL 20 MG TABLET: 20 | 90 days supply | Qty: 90 | Fill #2

## 2016-08-23 MED FILL — PANTOPRAZOLE SOD DR 40 MG T: 40 | 90 days supply | Qty: 90 | Fill #1

## 2016-08-23 MED FILL — ULORIC 80 MG TABLET: 80 | 90 days supply | Qty: 90 | Fill #0

## 2016-08-24 ENCOUNTER — Ambulatory Visit (INDEPENDENT_AMBULATORY_CARE_PROVIDER_SITE_OTHER): Payer: 59 | Admitting: Rehabilitative and Restorative Service Providers"

## 2016-08-24 ENCOUNTER — Encounter: Payer: Self-pay | Admitting: Rehabilitative and Restorative Service Providers"

## 2016-08-24 ENCOUNTER — Other Ambulatory Visit: Payer: Self-pay | Admitting: Sports Medicine

## 2016-08-24 DIAGNOSIS — R293 Abnormal posture: Secondary | ICD-10-CM

## 2016-08-24 DIAGNOSIS — M545 Low back pain, unspecified: Secondary | ICD-10-CM

## 2016-08-24 DIAGNOSIS — M542 Cervicalgia: Secondary | ICD-10-CM | POA: Diagnosis not present

## 2016-08-24 DIAGNOSIS — R29898 Other symptoms and signs involving the musculoskeletal system: Secondary | ICD-10-CM

## 2016-08-24 DIAGNOSIS — R531 Weakness: Secondary | ICD-10-CM

## 2016-08-24 NOTE — Therapy (Addendum)
[place], [name], [address] Phone: [phone]   Fax:  [phone]  Physical Therapy Treatment  Patient Details  Name: Andrew Gray MRN: [id number] Date of Birth: 05-05-1981 Referring Provider: Dr Dianah Field  Encounter Date: 08/24/2016      PT End of Session - 08/24/16 1419    Visit Number 8   Number of Visits 24   Date for PT Re-Evaluation 10/05/16   PT Start Time 1330   PT Stop Time 1431   PT Time Calculation (min) 61 min      History reviewed. No pertinent past medical history.  History reviewed. No pertinent surgical history.  There were no vitals filed for this visit.      Subjective Assessment - 08/24/16 1650    Subjective Back pain continues to be improved. Standing tolerance is now 40-50 minutes without pain; walking in improved with no limitation some days[place] but still has pain at times - feels this may be when he walks several days in a row.  Biggest problem now is the Rt cervical spine and shoulder. He has had pain in the area and a catch in the neck for the past two weeks. Awoke ~2 weeks ago and sat up in bed and felt sharp pain in the Lt lateral cervical/trap area. Symptoms have improved some but persist. Requests that we address cervical dysfunction in PT today.             [facility] PT Assessment - 08/24/16 0001      Assessment   Medical Diagnosis DDD lumbar spine    Referring Provider Dr Dianah Field   Onset Date/Surgical Date 08/10/16  cervical - - lumbar 04/20/17   Hand Dominance Right   Next MD Visit PRN     Sensation   Additional Comments intermittent tingling and numbness lateral Lt calf decreased frequency; UE's WNL's      AROM   AROM Assessment Site --  cervical ROM assessed in sitting    Cervical Flexion 80%   Cervical Extension 45%   Cervical - Right Side Bend 50%   Cervical - Left Side Bend 40%   Cervical  - Right Rotation 70%   Cervical - Left Rotation 45%     Strength   Overall Strength Comments WFL's bilat UE's      Palpation   Spinal mobility tightness with CPA mobs thoracic and cervical spine    Palpation comment significant tightness Rt ant/lat/post cervical musculature; upper traps; leveator; pec - especially pec minor;                     [facility] Adult PT Treatment/Exercise - 08/24/16 0001      Neck Exercises: Standing   Neck Retraction 5 reps;10 secs  w/ noodle    Other Standing Exercises pec stretch through doorway      Neck Exercises: Supine   Neck Retraction 5 reps;10 secs  head supported on pillow      Moist Heat Therapy   Number Minutes Moist Heat 15 Minutes   Moist Heat Location Cervical  and pecs     Electrical Stimulation   Electrical Stimulation Location Rt cervical paraspinals and upper trap   Electrical Stimulation Action IFC   Electrical Stimulation Parameters to tolerance    Electrical Stimulation Goals Pain     Manual Therapy   Manual Therapy Myofascial release;Soft tissue mobilization   Joint Mobilization CPA  mobs upper thoracic and cervical spine    Soft tissue mobilization Pin and stretch to Rt scalenes and upper trap.  STM to bilat cervical paraspinals.    Myofascial Release suboccipital release, Pec release, MFR to platysma, Rt SCM, upper trap, scalenes.    Passive ROM into cervical flexion; cervical flexion with slight rotation; lateral flexion to Rt           Trigger Point Dry Needling - 08/24/16 1648    Consent Given? Yes   Muscles Treated Lower Body --  Rt cervical    Upper Trapezius Response Palpable increased muscle length   Oblique Capitus Response Palpable increased muscle length   Longissimus Response Palpable increased muscle length                   PT Long Term Goals - 08/24/16 1659      PT LONG TERM GOAL #1   Title Improve posture and alignment with patient to demonstrate upright posture without  lateral deviation and with neutral spine position with good thoracic extension  10/05/16/18   Time 18   Period Weeks   Status Revised     PT LONG TERM GOAL #2   Title Patient to report standing and walking for 30-45 min without pain 10/05/16   Time 18   Period Weeks   Status Achieved     PT LONG TERM GOAL #3   Title Improve core strength and stabilty with patient to report/demonstrate good core contraction with stabilization program 10/05/16   Time 18   Period Weeks   Status On-going     PT LONG TERM GOAL #4   Title Independent with HEP 10/05/16   Time 18   Period Weeks   Status On-going     PT LONG TERM GOAL #5   Title Decrease muscular tightness Rt cervical and shoudler girdle area allowing patient to turn head easliy and without pain to look over either shoudler 10/05/16   Time 6   Period Weeks   Status New               Plan - 08/24/16 1704    Clinical Impression Statement Continued improvement in lumbar pain and discomfort. Patient reports incresaed standing and walking tolerance. He is working on his exercise program and realizes he will need to continue with this on an ongoing basis. 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 He has poor cervical and thoracic posture and alignment; limited cervical mobilty and ROM; muscular tightness through the cervical and Rt shoudler girdle areas. He will benefit form PT to continue to continue to address the lumbar DDD and cervical dysfunction.   Rehab Potential Good   PT Frequency 1x / week   PT Duration 6 weeks   PT Treatment/Interventions Patient/family education;Neuromuscular re-education;ADLs/Self Care Home Management;Cryotherapy;Electrical Stimulation;Iontophoresis 53m/ml Dexamethasone;Moist Heat;Traction;Ultrasound;Dry needling;Manual techniques;Therapeutic activities;Therapeutic exercise   PT Next Visit Plan continue DN; manual work; postural correction; neuromuscular  re-education; madalities as indicated. Stretch hip flexors and cervical musculature. Strengthen core    Consulted and Agree with Plan of Care Patient      Patient will benefit from skilled therapeutic intervention in order to improve the following deficits and impairments:  Postural dysfunction, Improper body mechanics, Pain, Increased fascial restricitons, Increased muscle spasms, Decreased mobility, Decreased activity tolerance  Visit Diagnosis: Acute left-sided low back pain without sciatica - Plan: PT plan of care cert/re-cert  Abnormal posture - Plan: PT plan of care  cert/re-cert  Weakness generalized - Plan: PT plan of care cert/re-cert  Other symptoms and signs involving the musculoskeletal system - Plan: PT plan of care cert/re-cert  Cervicalgia - Plan: PT plan of care cert/re-cert     Problem List Patient Active Problem List   Diagnosis Date Noted  . Abnormal weight gain 02/23/2015  . Hyperlipidemia 02/23/2015  . Transaminitis 02/23/2015  . Essential hypertension, benign 02/23/2015  . Annual physical exam 02/16/2015  . Gout 02/16/2015  . Spondylolisthesis at L5-S1 level 02/15/2015    Atlee Villers Nilda Simmer PT, MPH  08/24/2016, 5:07 PM  [facility] [place], [name], [address] Phone: [phone]   Fax:  [phone]  Name: Andrew Gray MRN: [id number] Date of Birth: 03-Jun-1980  PHYSICAL THERAPY DISCHARGE SUMMARY  Visits from Start of Care: 8  Current functional level related to goals / functional outcomes: See last progress note for discharge status    Remaining deficits: Continued pain on an intermittent basis. Continued postural deficits.   Education / Equipment: HEP  Plan: Patient agrees to discharge.  Patient goals were partially met. Patient is being discharged due to being pleased with the current functional level.  ?????    Darden Flemister P. Helene Kelp PT, MPH 10/05/16 12:35 PM

## 2016-09-13 MED FILL — VYVANSE 20 MG CAPSULE: 20 | 30 days supply | Qty: 30 | Fill #0

## 2016-10-30 MED FILL — VYVANSE 20 MG CAPSULE: 20 | 30 days supply | Qty: 30 | Fill #0

## 2016-11-16 DIAGNOSIS — F1920 Other psychoactive substance dependence, uncomplicated: Secondary | ICD-10-CM | POA: Diagnosis not present

## 2016-11-16 DIAGNOSIS — F419 Anxiety disorder, unspecified: Secondary | ICD-10-CM | POA: Diagnosis not present

## 2016-11-16 DIAGNOSIS — F902 Attention-deficit hyperactivity disorder, combined type: Secondary | ICD-10-CM | POA: Diagnosis not present

## 2016-11-16 DIAGNOSIS — Z79899 Other long term (current) drug therapy: Secondary | ICD-10-CM | POA: Diagnosis not present

## 2016-11-19 ENCOUNTER — Other Ambulatory Visit: Payer: Self-pay | Admitting: Sports Medicine

## 2016-11-19 DIAGNOSIS — I10 Essential (primary) hypertension: Secondary | ICD-10-CM

## 2016-11-19 MED FILL — LISINOPRIL 20 MG TABLET: 20 | 90 days supply | Qty: 90 | Fill #0

## 2016-11-19 MED FILL — PANTOPRAZOLE SOD DR 40 MG T: 40 | 90 days supply | Qty: 90 | Fill #2

## 2016-11-19 MED FILL — ULORIC 80 MG TABLET: 80 | 90 days supply | Qty: 90 | Fill #1

## 2016-11-21 ENCOUNTER — Encounter: Payer: Self-pay | Admitting: Sports Medicine

## 2016-11-30 MED FILL — VYVANSE 20 MG CAPSULE: 20 | 30 days supply | Qty: 30 | Fill #0

## 2017-02-15 ENCOUNTER — Telehealth: Payer: Self-pay | Admitting: Sports Medicine

## 2017-02-15 DIAGNOSIS — Z79899 Other long term (current) drug therapy: Secondary | ICD-10-CM | POA: Diagnosis not present

## 2017-02-15 DIAGNOSIS — M5416 Radiculopathy, lumbar region: Secondary | ICD-10-CM

## 2017-02-15 DIAGNOSIS — F902 Attention-deficit hyperactivity disorder, combined type: Secondary | ICD-10-CM | POA: Diagnosis not present

## 2017-02-15 DIAGNOSIS — F419 Anxiety disorder, unspecified: Secondary | ICD-10-CM | POA: Diagnosis not present

## 2017-02-15 MED ORDER — CYCLOBENZAPRINE HCL 10 MG PO TABS: ORAL_TABLET | ORAL | 0 refills | Status: DC

## 2017-02-15 MED ORDER — PREDNISONE 50 MG PO TABS: ORAL_TABLET | ORAL | 0 refills | Status: DC

## 2017-02-15 MED FILL — predniSONE 50 MG TABS: 50 | 5 days supply | Qty: 5 | Fill #0

## 2017-02-15 MED FILL — CYCLOBENZAPRINE 10 MG TAB: 10 | 10 days supply | Qty: 30 | Fill #0

## 2017-02-15 NOTE — Addendum Note (Signed)
Addended by: Silverio Decamp on: 02/15/2017 11:31 AM   Modules accepted: Orders

## 2017-02-15 NOTE — Telephone Encounter (Addendum)
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 Radicular pain worse than axial pain. At this point his failed formal physical therapy with dry needling, gabapentin, NSAIDs, steroids, at this point he does desire to proceed with an epidural, I do see contact of the extraforaminal L5 nerve root with the left far lateral L5-S1 protruding discs so we are going to proceed with a left L5-S1 transforaminal epidural. Patient will follow-up one month after injection to evaluate relief.  Also adding prednisone and Flexeril per patient request.

## 2017-02-18 MED FILL — ULORIC 80 MG TABLET: 80 | 90 days supply | Qty: 90 | Fill #2

## 2017-02-18 MED FILL — PANTOPRAZOLE SOD DR 40 MG T: 40 | 90 days supply | Qty: 90 | Fill #3

## 2017-02-18 MED FILL — LISINOPRIL 20 MG TABLET: 20 | 90 days supply | Qty: 90 | Fill #1

## 2017-03-01 ENCOUNTER — Ambulatory Visit
Admission: RE | Admit: 2017-03-01 | Discharge: 2017-03-01 | Disposition: A | Discharge status: Home / Self Care | Payer: 59 | Source: Ambulatory Visit | Attending: Sports Medicine | Admitting: Sports Medicine

## 2017-03-01 VITALS — BP 159/94 | HR 51

## 2017-03-01 DIAGNOSIS — M5416 Radiculopathy, lumbar region: Secondary | ICD-10-CM | POA: Diagnosis not present

## 2017-03-01 DIAGNOSIS — M4317 Spondylolisthesis, lumbosacral region: Secondary | ICD-10-CM

## 2017-03-01 MED ORDER — METHYLPREDNISOLONE ACETATE 40 MG/ML INJ SUSP (RADIOLOG
120.0000 mg | Freq: Once | INTRAMUSCULAR | Status: AC
  Administered 2017-03-01: 120 mg via EPIDURAL

## 2017-03-01 MED ORDER — IOPAMIDOL (ISOVUE-M 200) INJECTION 41%
1.0000 mL | Freq: Once | INTRAMUSCULAR | Status: AC
  Administered 2017-03-01: 1 mL via EPIDURAL

## 2017-03-01 MED FILL — COTEMPLA XR-ODT 25.9 MG TAB: 25.9 | 30 days supply | Qty: 30 | Fill #0

## 2017-03-01 NOTE — Discharge Instructions (Signed)

## 2017-04-05 ENCOUNTER — Telehealth: Payer: Self-pay | Admitting: Sports Medicine

## 2017-04-05 MED ORDER — TRIAMCINOLONE ACETONIDE 0.1 % EX CREA: 1.0000 "application " | TOPICAL_CREAM | Freq: Two times a day (BID) | CUTANEOUS | 11 refills | Status: DC

## 2017-04-05 MED ORDER — MUPIROCIN CALCIUM 2 % NA OINT: 1.0000 "application " | TOPICAL_OINTMENT | Freq: Two times a day (BID) | NASAL | 11 refills | Status: DC

## 2017-04-05 MED ORDER — DOXYCYCLINE HYCLATE 100 MG PO TABS: 100.0000 mg | ORAL_TABLET | Freq: Two times a day (BID) | ORAL | 0 refills | Status: AC

## 2017-04-05 MED FILL — DOXYCYCLINE HYCLATE 100 MG: 100 | 7 days supply | Qty: 14 | Fill #0

## 2017-04-05 MED FILL — TRIAMCINOLONE 0.1% CREAM: 0.1 | 30 days supply | Qty: 454 | Fill #0

## 2017-04-05 MED FILL — MUPIROCIN 2% OINTMENT: 2 | 30 days supply | Qty: 22 | Fill #0

## 2017-04-05 NOTE — Telephone Encounter (Signed)
Patient noted folliculitis on neck and throat from shaving, he is using an electric shaver. Adding doxycycline, triamcinolone, mupirocin topical.

## 2017-04-22 MED FILL — COTEMPLA XR-ODT 25.9 MG TAB: 25.9 | 30 days supply | Qty: 30 | Fill #0

## 2017-05-13 ENCOUNTER — Telehealth: Payer: Self-pay | Admitting: Sports Medicine

## 2017-05-13 MED ORDER — TYPHOID VACCINE PO CPDR: 1.0000 | DELAYED_RELEASE_CAPSULE | ORAL | 0 refills | Status: DC

## 2017-05-13 MED ORDER — SCOPOLAMINE 1 MG/3DAYS TD PT72: 1.0000 | MEDICATED_PATCH | TRANSDERMAL | 3 refills | Status: DC

## 2017-05-13 MED ORDER — CIPROFLOXACIN HCL 500 MG PO TABS: 500.0000 mg | ORAL_TABLET | Freq: Two times a day (BID) | ORAL | 0 refills | Status: DC

## 2017-05-13 MED FILL — TRANSDERM-SCOP 1.5 MG/3 DAY: 1 | 12 days supply | Qty: 4 | Fill #0

## 2017-05-13 MED FILL — VIVOTIF EC CAPSULE: 8 days supply | Qty: 4 | Fill #0

## 2017-05-13 MED FILL — CIPROFLOXACIN HCL 500 MG TA: 500 | 3 days supply | Qty: 6 | Fill #0

## 2017-05-13 NOTE — Telephone Encounter (Signed)
Sending in travel stuff

## 2017-05-17 DIAGNOSIS — Z79899 Other long term (current) drug therapy: Secondary | ICD-10-CM | POA: Diagnosis not present

## 2017-05-17 DIAGNOSIS — F419 Anxiety disorder, unspecified: Secondary | ICD-10-CM | POA: Diagnosis not present

## 2017-05-17 DIAGNOSIS — F902 Attention-deficit hyperactivity disorder, combined type: Secondary | ICD-10-CM | POA: Diagnosis not present

## 2017-05-20 ENCOUNTER — Other Ambulatory Visit: Payer: Self-pay | Admitting: Sports Medicine

## 2017-05-20 MED FILL — ULORIC 80 MG TABLET: 80 | 90 days supply | Qty: 90 | Fill #3

## 2017-05-20 MED FILL — LISINOPRIL 20 MG TABLET: 20 | 90 days supply | Qty: 90 | Fill #2

## 2017-05-20 MED FILL — PANTOPRAZOLE SOD DR 40 MG T: 40 | 90 days supply | Qty: 90 | Fill #0

## 2017-06-25 MED FILL — AMPHETAMINE-DEXTRO 20MG: 20 | 30 days supply | Qty: 60 | Fill #0

## 2017-07-26 ENCOUNTER — Telehealth: Payer: Self-pay | Admitting: Sports Medicine

## 2017-07-26 DIAGNOSIS — M4317 Spondylolisthesis, lumbosacral region: Secondary | ICD-10-CM

## 2017-07-26 NOTE — Telephone Encounter (Signed)
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  Ordering a left L5-S1 transforaminal epidural.

## 2017-08-01 MED FILL — DEXTROAMP-AMPHETAMIN 20 MG: 20 | 30 days supply | Qty: 60 | Fill #0

## 2017-08-05 LAB — HEPATIC FUNCTION PANEL
ALK PHOS: 47 (ref 25–125)
ALT: 36 (ref 10–40)
AST: 32 (ref 14–40)
Bilirubin, Total: 0.3

## 2017-08-05 LAB — CBC AND DIFFERENTIAL
HEMATOCRIT: 39 — AB (ref 41–53)
Hemoglobin: 13.8 (ref 13.5–17.5)
PLATELETS: 205 (ref 150–399)
WBC: 4.6

## 2017-08-05 LAB — BASIC METABOLIC PANEL
BUN: 12 (ref 4–21)
CREATININE: 0.8 (ref 0.6–1.3)
GLUCOSE: 89
Potassium: 4.7 (ref 3.4–5.3)
Sodium: 141 (ref 137–147)

## 2017-08-05 LAB — LIPID PANEL
Cholesterol: 162 (ref 0–200)
HDL: 41 (ref 35–70)
LDL CALC: 96
Triglycerides: 124 (ref 40–160)

## 2017-08-05 LAB — TSH: TSH: 2.43 (ref 0.41–5.90)

## 2017-08-05 LAB — PSA: PSA: 0.5

## 2017-08-09 ENCOUNTER — Ambulatory Visit
Admission: RE | Admit: 2017-08-09 | Discharge: 2017-08-09 | Disposition: A | Discharge status: Home / Self Care | Payer: 59 | Source: Ambulatory Visit | Attending: Sports Medicine | Admitting: Sports Medicine

## 2017-08-09 DIAGNOSIS — M4317 Spondylolisthesis, lumbosacral region: Secondary | ICD-10-CM | POA: Diagnosis not present

## 2017-08-09 MED ORDER — IOPAMIDOL (ISOVUE-M 200) INJECTION 41%
1.0000 mL | Freq: Once | INTRAMUSCULAR | Status: AC
  Administered 2017-08-09: 1 mL via EPIDURAL

## 2017-08-09 MED ORDER — METHYLPREDNISOLONE ACETATE 40 MG/ML INJ SUSP (RADIOLOG
120.0000 mg | Freq: Once | INTRAMUSCULAR | Status: AC
  Administered 2017-08-09: 120 mg via EPIDURAL

## 2017-08-16 ENCOUNTER — Ambulatory Visit (INDEPENDENT_AMBULATORY_CARE_PROVIDER_SITE_OTHER): Payer: 59 | Admitting: Sports Medicine

## 2017-08-16 ENCOUNTER — Encounter: Payer: Self-pay | Admitting: Sports Medicine

## 2017-08-16 DIAGNOSIS — Z23 Encounter for immunization: Secondary | ICD-10-CM | POA: Diagnosis not present

## 2017-08-16 DIAGNOSIS — F902 Attention-deficit hyperactivity disorder, combined type: Secondary | ICD-10-CM | POA: Diagnosis not present

## 2017-08-16 DIAGNOSIS — Z79899 Other long term (current) drug therapy: Secondary | ICD-10-CM | POA: Diagnosis not present

## 2017-08-16 NOTE — Progress Notes (Signed)
   Subjective:    Patient ID: Andrew Gray, male    DOB: 1980/08/27, 37 y.o.   MRN: [id number]  HPI  Jeshawn is here for T-dap. Reports no history of problems with vaccines in the past.   Review of Systems     Objective:   Physical Exam        Assessment & Plan:  Need Tdap- Patient tolerated injection well without complications.

## 2017-08-19 ENCOUNTER — Other Ambulatory Visit: Payer: Self-pay | Admitting: Sports Medicine

## 2017-08-19 DIAGNOSIS — M1A0790 Idiopathic chronic gout, unspecified ankle and foot, without tophus (tophi): Secondary | ICD-10-CM

## 2017-08-19 DIAGNOSIS — I10 Essential (primary) hypertension: Secondary | ICD-10-CM

## 2017-08-19 MED FILL — ULORIC 80 MG TABLET: 80 | 90 days supply | Qty: 90 | Fill #0

## 2017-08-19 MED FILL — PANTOPRAZOLE SOD DR 40 MG T: 40 | 90 days supply | Qty: 90 | Fill #1

## 2017-08-19 MED FILL — LISINOPRIL 20 MG TABLET: 20 | 90 days supply | Qty: 90 | Fill #0

## 2017-09-18 MED FILL — DEXTROAMP-AMPHETAMIN 20 MG: 20 | 30 days supply | Qty: 60 | Fill #0

## 2017-10-02 ENCOUNTER — Encounter: Payer: Self-pay | Admitting: *Deleted

## 2017-10-11 ENCOUNTER — Ambulatory Visit (INDEPENDENT_AMBULATORY_CARE_PROVIDER_SITE_OTHER): Payer: 59 | Admitting: Sports Medicine

## 2017-10-11 VITALS — BP 149/86 | HR 63 | Wt 254.0 lb

## 2017-10-11 DIAGNOSIS — M1A0790 Idiopathic chronic gout, unspecified ankle and foot, without tophus (tophi): Secondary | ICD-10-CM

## 2017-10-11 DIAGNOSIS — I10 Essential (primary) hypertension: Secondary | ICD-10-CM | POA: Diagnosis not present

## 2017-10-11 DIAGNOSIS — Z0000 Encounter for general adult medical examination without abnormal findings: Secondary | ICD-10-CM | POA: Diagnosis not present

## 2017-10-11 DIAGNOSIS — E785 Hyperlipidemia, unspecified: Secondary | ICD-10-CM

## 2017-10-11 DIAGNOSIS — L719 Rosacea, unspecified: Secondary | ICD-10-CM | POA: Insufficient documentation

## 2017-10-11 DIAGNOSIS — F909 Attention-deficit hyperactivity disorder, unspecified type: Secondary | ICD-10-CM

## 2017-10-11 MED ORDER — LISINOPRIL 30 MG PO TABS: 30.0000 mg | ORAL_TABLET | Freq: Every day | ORAL | 3 refills | Status: DC

## 2017-10-11 MED ORDER — AZELAIC ACID 15 % EX GEL: 1.0000 "application " | Freq: Two times a day (BID) | CUTANEOUS | 11 refills | Status: DC

## 2017-10-11 MED FILL — LISINOPRIL 30 MG TABS: 30 | 90 days supply | Qty: 90 | Fill #0

## 2017-10-11 MED FILL — AZELAIC ACID 15 % GEL: 15 | 30 days supply | Qty: 30 | Fill #0

## 2017-10-11 NOTE — Assessment & Plan Note (Signed)
Continue slight elevation, increasing lisinopril to 30 mg daily, recheck in 2 weeks.

## 2017-10-11 NOTE — Assessment & Plan Note (Addendum)
Diet controlled, no changes.

## 2017-10-11 NOTE — Assessment & Plan Note (Signed)
Well-controlled with 20 mg of immediate release Adderall. Managed by an outside provider.

## 2017-10-11 NOTE — Assessment & Plan Note (Signed)
Papulopustular subtype. Insufficient response to topical metronidazole.   Switching to [place].

## 2017-10-11 NOTE — Assessment & Plan Note (Signed)
We can check uric acid levels at the next blood draw, continue Uloric.

## 2017-10-11 NOTE — Assessment & Plan Note (Signed)
Routine CPE as above.

## 2017-10-11 NOTE — Progress Notes (Signed)
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  Hypertension: Slightly elevated still on 20 mg of lisinopril, no headaches, visual changes, chest pain.  Lipids are normal, not on any medication.  Gout: Stable on Uloric 80.  Skin rash: Localized over the face, cheeks, tends to flush.  Has used topical metronidazole without efficacy.  I reviewed the past medical history, family history, social history, surgical history, and allergies today and no changes were needed.  Please see the problem list section below in epic for further details.  Past Medical History: No past medical history on file. Past Surgical History: No past surgical history on file. Social History: Social History   Socioeconomic History  . Marital status: Married    Spouse name: Not on file  . Number of children: Not on file  . Years of education: Not on file  . Highest education level: Not on file  Occupational History  . Not on file  Social Needs  . Financial resource strain: Not on file  . Food insecurity:    Worry: Not on file    Inability: Not on file  . Transportation needs:    Medical: Not on file    Non-medical: Not on file  Tobacco Use  . Smoking status: Never Smoker  Substance and Sexual Activity  . Alcohol use: Yes    Alcohol/week: 0.0 oz  . Drug use: No  . Sexual activity: Yes    Partners: Female  Lifestyle  . Physical activity:    Days per week: Not on file    Minutes per session: Not on file  . Stress: Not on file  Relationships  . Social connections:    Talks on phone: Not on file    Gets together: Not on file    Attends religious service: Not on file    Active member of club or organization: Not on file    Attends meetings of clubs or organizations: Not on file    Relationship status: Not on file  Other Topics Concern  . Not on file  Social History Narrative  . Not on file   Family History: No family history  on file. Allergies: Allergies  Allergen Reactions  . Sulfa Antibiotics Nausea And Vomiting   Medications: See med rec.  Review of Systems: No headache, visual changes, nausea, vomiting, diarrhea, constipation, dizziness, abdominal pain, skin rash, fevers, chills, night sweats, swollen lymph nodes, weight loss, chest pain, body aches, joint swelling, muscle aches, shortness of breath, mood changes, visual or auditory hallucinations.  Objective:    General: Well Developed, well nourished, and in no acute distress.  Neuro: Alert and oriented x3, extra-ocular muscles intact, sensation grossly intact. Cranial nerves II through XII are intact, motor, sensory, and coordinative functions are all intact. HEENT: Normocephalic, atraumatic, pupils equal round reactive to light, neck supple, no masses, no lymphadenopathy, thyroid nonpalpable. Oropharynx, nasopharynx, external ear canals are unremarkable. Skin: Warm and dry, no rashes noted.  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 Cardiac: Regular rate and rhythm, no murmurs rubs or gallops.  Respiratory: Clear to auscultation bilaterally. Not using accessory muscles, speaking in full sentences.  Abdominal: Soft, nontender, nondistended, positive bowel sounds, no masses, no organomegaly.  Musculoskeletal: Shoulder, elbow, wrist, hip, knee, ankle stable, and with full range of motion.  Impression and Recommendations:    The patient was counselled, risk factors were discussed, anticipatory guidance given.  Annual physical exam Routine CPE  as above.  Gout We can check uric acid levels at the next blood draw, continue Uloric.  Essential hypertension, benign Continue slight elevation, increasing lisinopril to 30 mg daily, recheck in 2 weeks.  Rosacea Papulopustular subtype. Insufficient response to topical metronidazole.   Switching to [place].  Adult ADHD Well-controlled with 20 mg of immediate release Adderall. Managed by an outside  provider.  Hyperlipidemia Diet controlled, no changes. ___________________________________________ Gwen Her. Dianah Field, M.D., ABFM., CAQSM. Primary Care and [place] Instructor of [place] [facility] of Medicine

## 2017-10-22 MED FILL — AMPHETAMINE SALTS 20 MG TAB: 20 | 30 days supply | Qty: 60 | Fill #0

## 2017-11-01 ENCOUNTER — Other Ambulatory Visit: Payer: Self-pay | Admitting: Sports Medicine

## 2017-11-01 MED ORDER — TADALAFIL 20 MG PO TABS: 20.0000 mg | ORAL_TABLET | Freq: Every day | ORAL | 11 refills | Status: DC | PRN

## 2017-11-18 ENCOUNTER — Ambulatory Visit (INDEPENDENT_AMBULATORY_CARE_PROVIDER_SITE_OTHER): Payer: 59

## 2017-11-18 ENCOUNTER — Telehealth: Payer: Self-pay | Admitting: *Deleted

## 2017-11-18 DIAGNOSIS — W230XXA Caught, crushed, jammed, or pinched between moving objects, initial encounter: Secondary | ICD-10-CM

## 2017-11-18 DIAGNOSIS — S6992XA Unspecified injury of left wrist, hand and finger(s), initial encounter: Secondary | ICD-10-CM | POA: Diagnosis not present

## 2017-11-18 MED FILL — PANTOPRAZOLE SOD DR 40 MG T: 40 | 90 days supply | Qty: 90 | Fill #2

## 2017-11-18 MED FILL — ULORIC 80 MG TABLET: 80 | 90 days supply | Qty: 90 | Fill #1

## 2017-11-18 NOTE — Telephone Encounter (Signed)
X-ray ordered for left index finger injury.

## 2017-11-22 ENCOUNTER — Other Ambulatory Visit: Payer: Self-pay | Admitting: Sports Medicine

## 2017-11-22 DIAGNOSIS — Z79899 Other long term (current) drug therapy: Secondary | ICD-10-CM | POA: Diagnosis not present

## 2017-11-22 DIAGNOSIS — F902 Attention-deficit hyperactivity disorder, combined type: Secondary | ICD-10-CM | POA: Diagnosis not present

## 2017-11-22 MED FILL — AMPHET-DEXTROAMP 20 MG TAB: 20 | 30 days supply | Qty: 60 | Fill #0

## 2017-12-17 ENCOUNTER — Telehealth: Payer: Self-pay | Admitting: Sports Medicine

## 2017-12-17 DIAGNOSIS — M25562 Pain in left knee: Secondary | ICD-10-CM | POA: Insufficient documentation

## 2017-12-17 NOTE — Telephone Encounter (Signed)
Alexa has increasing left knee pain, mild effusion, pain at the medial joint line not relieved by NSAIDs, desires interventional treatment.  Left knee: Mild effusion with tenderness at the medial joint line tenderness. ROM normal in flexion and extension and lower leg rotation. Ligaments with solid consistent endpoints including ACL, PCL, LCL, MCL. Negative Mcmurray's and provocative meniscal tests. Non painful patellar compression. Patellar and quadriceps tendons unremarkable. Hamstring and quadriceps strength is normal.  Procedure: Real-time Ultrasound Guided Injection of left knee Device: GE Logiq E  Verbal informed consent obtained.  Time-out conducted.  Noted no overlying erythema, induration, or other signs of local infection.  Skin prepped in a sterile fashion.  Local anesthesia: Topical Ethyl chloride.  With sterile technique and under real time ultrasound guidance: 1 cc kenalog 40, 2 cc lidocaine, 2 cc bupivacaine injected easily Completed without difficulty  Pain immediately resolved suggesting accurate placement of the medication.  Advised to call if fevers/chills, erythema, induration, drainage, or persistent bleeding.  Images permanently stored and available for review in the ultrasound unit.  Impression: Technically successful ultrasound guided injection.   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 Left knee injection as above, return in 1 month.

## 2017-12-17 NOTE — Assessment & Plan Note (Signed)
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 Left knee injection as above, return in 1 month.

## 2018-01-13 MED FILL — LISINOPRIL 30 MG TABLET: 30 | 90 days supply | Qty: 90 | Fill #1

## 2018-01-23 ENCOUNTER — Other Ambulatory Visit: Payer: Self-pay | Admitting: Sports Medicine

## 2018-01-23 ENCOUNTER — Ambulatory Visit (INDEPENDENT_AMBULATORY_CARE_PROVIDER_SITE_OTHER): Payer: 59

## 2018-01-23 DIAGNOSIS — M1611 Unilateral primary osteoarthritis, right hip: Secondary | ICD-10-CM | POA: Diagnosis not present

## 2018-01-23 DIAGNOSIS — M4317 Spondylolisthesis, lumbosacral region: Secondary | ICD-10-CM | POA: Diagnosis not present

## 2018-01-23 DIAGNOSIS — M25551 Pain in right hip: Secondary | ICD-10-CM

## 2018-01-23 DIAGNOSIS — M47816 Spondylosis without myelopathy or radiculopathy, lumbar region: Secondary | ICD-10-CM | POA: Diagnosis not present

## 2018-01-24 ENCOUNTER — Encounter: Payer: Self-pay | Admitting: Sports Medicine

## 2018-01-24 ENCOUNTER — Other Ambulatory Visit: Payer: Self-pay | Admitting: Sports Medicine

## 2018-01-24 ENCOUNTER — Ambulatory Visit (INDEPENDENT_AMBULATORY_CARE_PROVIDER_SITE_OTHER): Payer: 59 | Admitting: Sports Medicine

## 2018-01-24 DIAGNOSIS — M25551 Pain in right hip: Secondary | ICD-10-CM

## 2018-01-24 DIAGNOSIS — I10 Essential (primary) hypertension: Secondary | ICD-10-CM | POA: Diagnosis not present

## 2018-01-24 DIAGNOSIS — M4317 Spondylolisthesis, lumbosacral region: Secondary | ICD-10-CM

## 2018-01-24 DIAGNOSIS — M25562 Pain in left knee: Secondary | ICD-10-CM | POA: Diagnosis not present

## 2018-01-24 DIAGNOSIS — M1A0790 Idiopathic chronic gout, unspecified ankle and foot, without tophus (tophi): Secondary | ICD-10-CM | POA: Diagnosis not present

## 2018-01-24 LAB — CBC
HCT: 43.3 % (ref 38.5–50.0)
Hemoglobin: 15.1 g/dL (ref 13.2–17.1)
MCH: 29.5 pg (ref 27.0–33.0)
MCHC: 34.9 g/dL (ref 32.0–36.0)
MCV: 84.7 fL (ref 80.0–100.0)
MPV: 11.3 fL (ref 7.5–12.5)
Platelets: 242 Thousand/uL (ref 140–400)
RBC: 5.11 10*6/uL (ref 4.20–5.80)
RDW: 12.2 % (ref 11.0–15.0)
WBC: 4.8 10*3/uL (ref 3.8–10.8)

## 2018-01-24 LAB — COMPREHENSIVE METABOLIC PANEL WITH GFR
AG Ratio: 2.1 (calc) (ref 1.0–2.5)
Albumin: 5.3 g/dL — ABNORMAL HIGH (ref 3.6–5.1)
Alkaline phosphatase (APISO): 49 U/L (ref 40–115)
CO2: 27 mmol/L (ref 20–32)
Calcium: 10.3 mg/dL (ref 8.6–10.3)
Chloride: 99 mmol/L (ref 98–110)
Creat: 0.96 mg/dL (ref 0.60–1.35)
Globulin: 2.5 g/dL (ref 1.9–3.7)
Potassium: 4.4 mmol/L (ref 3.5–5.3)

## 2018-01-24 LAB — COMPREHENSIVE METABOLIC PANEL
ALT: 53 U/L — ABNORMAL HIGH (ref 9–46)
AST: 30 U/L (ref 10–40)
BUN: 13 mg/dL (ref 7–25)
Glucose, Bld: 86 mg/dL (ref 65–139)
Sodium: 137 mmol/L (ref 135–146)
Total Bilirubin: 1 mg/dL (ref 0.2–1.2)
Total Protein: 7.8 g/dL (ref 6.1–8.1)

## 2018-01-24 LAB — URIC ACID: Uric Acid, Serum: 5.4 mg/dL (ref 4.0–8.0)

## 2018-01-24 MED ORDER — GABAPENTIN 300 MG PO CAPS: 300.0000 mg | ORAL_CAPSULE | Freq: Every day | ORAL | 3 refills | Status: DC

## 2018-01-24 MED FILL — GABAPENTIN 300 MG CAPSULE: 300 | 90 days supply | Qty: 90 | Fill #0

## 2018-01-24 NOTE — Progress Notes (Signed)
Subjective:    CC: Multiple issues  HPI: Hypertension: Under adequate control, no headaches, visual changes, chest pain.  Knee pain: Now pain-free after injection a month ago.  Lumbar spondylolisthesis: No axial pain, does have left-sided radicular symptoms.  He had an epidural a year and a half ago, and another one in April of this year, both were left L5-S1 transforaminal, the first epidural provided over a year of relief, the second only a few months.  Symptoms are mostly radicular.  He has tried occasional gabapentin with mild effect.  Hip pain: Right-sided, worse with hip flexion, occasional mechanical symptoms, NSAIDs are not helpful.  I reviewed the past medical history, family history, social history, surgical history, and allergies today and no changes were needed.  Please see the problem list section below in epic for further details.  Past Medical History: No past medical history on file. Past Surgical History: No past surgical history on file. Social History: Social History   Socioeconomic History  . Marital status: Married    Spouse name: Not on file  . Number of children: Not on file  . Years of education: Not on file  . Highest education level: Not on file  Occupational History  . Not on file  Social Needs  . Financial resource strain: Not on file  . Food insecurity:    Worry: Not on file    Inability: Not on file  . Transportation needs:    Medical: Not on file    Non-medical: Not on file  Tobacco Use  . Smoking status: Never Smoker  . Smokeless tobacco: Never Used  Substance and Sexual Activity  . Alcohol use: Yes    Alcohol/week: 0.0 standard drinks  . Drug use: No  . Sexual activity: Yes    Partners: Female  Lifestyle  . Physical activity:    Days per week: Not on file    Minutes per session: Not on file  . Stress: Not on file  Relationships  . Social connections:    Talks on phone: Not on file    Gets together: Not on file    Attends  religious service: Not on file    Active member of club or organization: Not on file    Attends meetings of clubs or organizations: Not on file    Relationship status: Not on file  Other Topics Concern  . Not on file  Social History Narrative  . Not on file   Family History: No family history on file. Allergies: Allergies  Allergen Reactions  . Sulfa Antibiotics Nausea And Vomiting   Medications: See med rec.  Review of Systems: No fevers, chills, night sweats, weight loss, chest pain, or shortness of breath.   Objective:    General: Well Developed, well nourished, and in no acute distress.  Neuro: Alert and oriented x3, extra-ocular muscles intact, sensation grossly intact.  HEENT: Normocephalic, atraumatic, pupils equal round reactive to light, neck supple, no masses, no lymphadenopathy, thyroid nonpalpable.  Skin: Warm and dry, no rashes. Cardiac: Regular rate and rhythm, no murmurs rubs or gallops, no lower extremity edema.  Respiratory: Clear to auscultation bilaterally. Not using accessory muscles, speaking in full sentences. Right hip: ROM IR: 60 Deg, ER: 60 Deg, Flexion: 120 Deg, Extension: 100 Deg, Abduction: 45 Deg, Adduction: 45 Deg Strength IR: 5/5, ER: 5/5, Flexion: 5/5, Extension: 5/5, Abduction: 5/5, Adduction: 5/5 Pelvic alignment unremarkable to inspection and palpation. Standing hip rotation and gait without trendelenburg / unsteadiness. Greater trochanter without tenderness  to palpation. No tenderness over piriformis. No SI joint tenderness and normal minimal SI movement. Mild discomfort with flexion, adduction, internal rotation.  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  Impression and Recommendations:    Gout Checking uric acid levels.  Essential hypertension, benign Under adequate control, checking CBC, CMP.  Continue lisinopril 30.  Left  knee pain Pain-free after injection 1 month ago.  Right hip pain Unfortunately does have some osteoarthritis on x-rays, some mechanical symptoms. We are going to proceed with an MR arthrogram looking for labral injury.   Spondylolisthesis at L5-S1 level Moderate spondylolisthesis at L5-S1 with unilateral pars defect. Most recent epidural was in April of this year, transforaminal at L5-S1, he did have another one the year before that provided a year of relief, also transforaminal on the left. We are going to add a new MRI for additional interventional planning, and proceed with another epidural, this time interlaminar and left at the L5-S1 level. After his MRI we will discuss a second opinion with Dr. Phylliss Bob and Pricilla Holm, PA-C.  ___________________________________________ Gwen Her. Dianah Field, M.D., ABFM., CAQSM. Primary Care and [place] Instructor of [place] [employer] [facility] of Medicine

## 2018-01-24 NOTE — Assessment & Plan Note (Signed)
Checking uric acid levels.

## 2018-01-24 NOTE — Assessment & Plan Note (Signed)
Pain-free after injection 1 month ago.

## 2018-01-24 NOTE — Assessment & Plan Note (Signed)
Moderate spondylolisthesis at L5-S1 with unilateral pars defect. Most recent epidural was in April of this year, transforaminal at L5-S1, he did have another one the year before that provided a year of relief, also transforaminal on the left. We are going to add a new MRI for additional interventional planning, and proceed with another epidural, this time interlaminar and left at the L5-S1 level. After his MRI we will discuss a second opinion with Dr. Phylliss Bob and Pricilla Holm, PA-C.

## 2018-01-24 NOTE — Assessment & Plan Note (Signed)
Under adequate control, checking CBC, CMP.  Continue lisinopril 30.

## 2018-01-24 NOTE — Assessment & Plan Note (Signed)
Unfortunately does have some osteoarthritis on x-rays, some mechanical symptoms. We are going to proceed with an MR arthrogram looking for labral injury.

## 2018-01-29 ENCOUNTER — Ambulatory Visit (INDEPENDENT_AMBULATORY_CARE_PROVIDER_SITE_OTHER): Payer: 59 | Admitting: Physician Assistant

## 2018-01-29 DIAGNOSIS — Z23 Encounter for immunization: Secondary | ICD-10-CM

## 2018-01-29 MED FILL — DEXTROAMP-AMPHETAMIN 20 MG: 20 | 30 days supply | Qty: 60 | Fill #0

## 2018-01-29 NOTE — Progress Notes (Signed)
..  Diagnoses and all orders for this visit:  Need for immunization against influenza -     Flu Vaccine QUAD 36+ mos IM   Pt presented to clinic for flu shot. No complications. Iran Planas PA-C

## 2018-02-03 ENCOUNTER — Ambulatory Visit (INDEPENDENT_AMBULATORY_CARE_PROVIDER_SITE_OTHER): Payer: 59 | Admitting: Sports Medicine

## 2018-02-03 ENCOUNTER — Encounter: Payer: Self-pay | Admitting: Sports Medicine

## 2018-02-03 ENCOUNTER — Ambulatory Visit (INDEPENDENT_AMBULATORY_CARE_PROVIDER_SITE_OTHER): Payer: 59

## 2018-02-03 DIAGNOSIS — X58XXXA Exposure to other specified factors, initial encounter: Secondary | ICD-10-CM | POA: Diagnosis not present

## 2018-02-03 DIAGNOSIS — S73192A Other sprain of left hip, initial encounter: Secondary | ICD-10-CM | POA: Diagnosis not present

## 2018-02-03 DIAGNOSIS — S73191A Other sprain of right hip, initial encounter: Secondary | ICD-10-CM

## 2018-02-03 DIAGNOSIS — M25551 Pain in right hip: Secondary | ICD-10-CM | POA: Diagnosis not present

## 2018-02-03 DIAGNOSIS — M545 Low back pain: Secondary | ICD-10-CM | POA: Diagnosis not present

## 2018-02-03 DIAGNOSIS — M4317 Spondylolisthesis, lumbosacral region: Secondary | ICD-10-CM | POA: Diagnosis not present

## 2018-02-03 MED ORDER — GADOBENATE DIMEGLUMINE 529 MG/ML IV SOLN
5.0000 mL | Freq: Once | INTRAVENOUS | Status: AC | PRN
  Administered 2018-02-03: 1 mL via INTRA_ARTICULAR

## 2018-02-03 NOTE — Assessment & Plan Note (Addendum)
Osteoarthritis on x-rays, some mechanical symptoms, injection for MR arthrogram looking for labral injury.  No steroid placed in this arthrogram, MRI does show hip osteoarthritis with labral tearing.   I would suggest we try some steroid into the hip, Dr. Georgina Snell declines. Referral to Dr. Griffin Basil for consideration of hip arthroscopy.

## 2018-02-03 NOTE — Progress Notes (Addendum)
   Procedure: Real-time Ultrasound Guided gadolinium contrast injection of right hip joint Device: GE Logiq E  Verbal informed consent obtained.  Time-out conducted.  Noted no overlying erythema, induration, or other signs of local infection.  Skin prepped in a sterile fashion.  Local anesthesia: Topical Ethyl chloride.  With sterile technique and under real time ultrasound guidance: 22-gauge spinal needle advanced to the femoral head/neck junction, contacted bone and injected 2 cc lidocaine, 2 cc bupivacaine, syringe switched and 5 cc Isovue injected, syringe again switched, 0.1 cc gadolinium injected, I then used approximately 10 cc sterile saline to flush the needle. Joint visualized and capsule seen distending confirming intra-articular placement of contrast material and medication. Completed without difficulty  Advised to call if fevers/chills, erythema, induration, drainage, or persistent bleeding.  Images permanently stored and available for review in the ultrasound unit.  Impression: Technically successful ultrasound guided gadolinium contrast injection for MR arthrography.  Please see separate MR arthrogram report.  ____________________________________________________________________________________________  Right hip pain Osteoarthritis on x-rays, some mechanical symptoms, injection for MR arthrogram looking for labral injury.  No steroid placed in this arthrogram, MRI does show hip osteoarthritis with labral tearing.   I would suggest we try some steroid into the hip, Dr. Georgina Snell declines. Referral to Dr. Griffin Basil for consideration of hip arthroscopy.

## 2018-02-04 NOTE — Addendum Note (Signed)
Addended by: Silverio Decamp on: 02/04/2018 09:00 AM   Modules accepted: Orders

## 2018-02-06 ENCOUNTER — Ambulatory Visit (INDEPENDENT_AMBULATORY_CARE_PROVIDER_SITE_OTHER): Payer: 59 | Admitting: Sports Medicine

## 2018-02-06 DIAGNOSIS — M25551 Pain in right hip: Secondary | ICD-10-CM | POA: Diagnosis not present

## 2018-02-06 NOTE — Progress Notes (Signed)
   Procedure: Real-time Ultrasound Guided Injection of right hip joint Device: GE Logiq E  Verbal informed consent obtained.  Time-out conducted.  Noted no overlying erythema, induration, or other signs of local infection.  Skin prepped in a sterile fashion.  Local anesthesia: Topical Ethyl chloride.  With sterile technique and under real time ultrasound guidance: 22-gauge spinal needle advanced to the femoral head/neck junction, contacted bone and injected 1 cc kenalog 40, 2 cc lidocaine, 2 cc bupivacaine. Completed without difficulty  Pain immediately resolved suggesting accurate placement of the medication.  Advised to call if fevers/chills, erythema, induration, drainage, or persistent bleeding.  Images permanently stored and available for review in the ultrasound unit.  Impression: Technically successful ultrasound guided injection.

## 2018-02-06 NOTE — Assessment & Plan Note (Signed)
Noted osteoarthritis, labral tearing on MR arthrogram. Discussed with Dr. Griffin Basil, he is recommended hip joint steroid injection, if persistence of discomfort then referral to either Dr. Suella Grove in [place] or Dr. Aretha Parrot in [place] for hip arthroscopy. We will reevaluate this in 1 month.

## 2018-02-12 ENCOUNTER — Other Ambulatory Visit: Payer: Self-pay | Admitting: Sports Medicine

## 2018-02-14 ENCOUNTER — Ambulatory Visit
Admission: RE | Admit: 2018-02-14 | Discharge: 2018-02-14 | Disposition: A | Discharge status: Home / Self Care | Payer: 59 | Source: Ambulatory Visit | Attending: Sports Medicine | Admitting: Sports Medicine

## 2018-02-14 DIAGNOSIS — M4317 Spondylolisthesis, lumbosacral region: Secondary | ICD-10-CM | POA: Diagnosis not present

## 2018-02-14 MED ORDER — IOPAMIDOL (ISOVUE-M 200) INJECTION 41%
1.0000 mL | Freq: Once | INTRAMUSCULAR | Status: AC
  Administered 2018-02-14: 1 mL via EPIDURAL

## 2018-02-14 MED ORDER — METHYLPREDNISOLONE ACETATE 40 MG/ML INJ SUSP (RADIOLOG
120.0000 mg | Freq: Once | INTRAMUSCULAR | Status: AC
  Administered 2018-02-14: 120 mg via EPIDURAL

## 2018-02-17 MED FILL — PANTOPRAZOLE SOD DR 40 MG T: 40 | 90 days supply | Qty: 90 | Fill #3

## 2018-02-17 MED FILL — FEBUXOSTAT 80 MG TABS: 80 | 90 days supply | Qty: 90 | Fill #2

## 2018-02-19 ENCOUNTER — Other Ambulatory Visit: Payer: Self-pay | Admitting: Sports Medicine

## 2018-02-19 DIAGNOSIS — M4317 Spondylolisthesis, lumbosacral region: Secondary | ICD-10-CM

## 2018-02-21 DIAGNOSIS — F419 Anxiety disorder, unspecified: Secondary | ICD-10-CM | POA: Diagnosis not present

## 2018-02-21 DIAGNOSIS — F902 Attention-deficit hyperactivity disorder, combined type: Secondary | ICD-10-CM | POA: Diagnosis not present

## 2018-02-21 DIAGNOSIS — Z79899 Other long term (current) drug therapy: Secondary | ICD-10-CM | POA: Diagnosis not present

## 2018-02-28 DIAGNOSIS — M5416 Radiculopathy, lumbar region: Secondary | ICD-10-CM | POA: Diagnosis not present

## 2018-03-05 ENCOUNTER — Other Ambulatory Visit: Payer: Self-pay | Admitting: Sports Medicine

## 2018-03-05 DIAGNOSIS — M25551 Pain in right hip: Secondary | ICD-10-CM

## 2018-03-18 MED FILL — DEXTROAMP-AMPHETAMIN 20 MG: 20 | 30 days supply | Qty: 60 | Fill #0

## 2018-04-14 MED FILL — LISINOPRIL 30 MG TABLET: 30 | 90 days supply | Qty: 90 | Fill #2

## 2018-05-01 ENCOUNTER — Other Ambulatory Visit: Payer: Self-pay | Admitting: Sports Medicine

## 2018-05-01 MED ORDER — TADALAFIL 20 MG PO TABS: 20.0000 mg | ORAL_TABLET | Freq: Every day | ORAL | 11 refills | Status: DC | PRN

## 2018-05-13 MED FILL — DEXTROAMP-AMPHETAMIN 20 MG: 20 | 30 days supply | Qty: 60 | Fill #0

## 2018-05-19 ENCOUNTER — Other Ambulatory Visit: Payer: Self-pay | Admitting: Sports Medicine

## 2018-05-19 MED FILL — FEBUXOSTAT 80 MG TABS: 80 | 90 days supply | Qty: 90 | Fill #3

## 2018-05-19 MED FILL — PANTOPRAZOLE SOD DR 40 MG T: 40 | 90 days supply | Qty: 90 | Fill #0

## 2018-06-02 DIAGNOSIS — S73191A Other sprain of right hip, initial encounter: Secondary | ICD-10-CM | POA: Diagnosis not present

## 2018-06-06 DIAGNOSIS — F902 Attention-deficit hyperactivity disorder, combined type: Secondary | ICD-10-CM | POA: Diagnosis not present

## 2018-06-06 DIAGNOSIS — Z79899 Other long term (current) drug therapy: Secondary | ICD-10-CM | POA: Diagnosis not present

## 2018-06-20 MED FILL — AMPHETAMINE-DEXTROAMPHETAMI: 20 | 30 days supply | Qty: 60 | Fill #0

## 2018-07-03 ENCOUNTER — Other Ambulatory Visit: Payer: Self-pay | Admitting: Sports Medicine

## 2018-07-03 MED ORDER — BENZONATATE 200 MG PO CAPS: 200.0000 mg | ORAL_CAPSULE | Freq: Three times a day (TID) | ORAL | 0 refills | Status: DC | PRN

## 2018-07-03 MED FILL — BENZONATATE 200 MG CAPS: 200 | 15 days supply | Qty: 45 | Fill #0

## 2018-07-15 DIAGNOSIS — M25551 Pain in right hip: Secondary | ICD-10-CM | POA: Diagnosis not present

## 2018-07-16 MED FILL — LISINOPRIL 30 MG TABLET: 30 | 90 days supply | Qty: 90 | Fill #3

## 2018-07-17 MED FILL — traZODone HCL 50 MG TABS: 50 | 30 days supply | Qty: 60 | Fill #0

## 2018-07-24 ENCOUNTER — Telehealth: Payer: Self-pay | Admitting: Sports Medicine

## 2018-07-24 DIAGNOSIS — I10 Essential (primary) hypertension: Secondary | ICD-10-CM

## 2018-07-24 MED ORDER — AMLODIPINE BESYLATE 10 MG PO TABS: 5.0000 mg | ORAL_TABLET | Freq: Every day | ORAL | 11 refills | Status: DC

## 2018-07-24 MED ORDER — HYDROCHLOROTHIAZIDE 25 MG PO TABS: 12.5000 mg | ORAL_TABLET | Freq: Every day | ORAL | 11 refills | Status: DC

## 2018-07-24 MED FILL — AMLODIPINE BESYLATE 10 MG T: 10 | 90 days supply | Qty: 45 | Fill #0

## 2018-07-24 MED FILL — HYDROCHLOROTHIAZIDE 25 MG T: 25 | 90 days supply | Qty: 45 | Fill #0

## 2018-07-24 NOTE — Telephone Encounter (Signed)
After discussion with Dr. Georgina Snell and a concern for upregulation of angiotensin-converting enzyme receptors with ACE inhibitors admidst the current COVID19 situation the patient does desire that we switch from his ACE inhibitor to hydrochlorothiazide 12.5 mg daily and amlodipine 5 mg daily, he will break the full dose pills in half and check his blood pressures.

## 2018-07-24 NOTE — Assessment & Plan Note (Signed)
After discussion with Dr. Georgina Snell and a concern for upregulation of angiotensin-converting enzyme receptors with ACE inhibitors admidst the current COVID19 situation the patient does desire that we switch from his ACE inhibitor to hydrochlorothiazide 12.5 mg daily and amlodipine 5 mg daily, he will break the full dose pills in half and check his blood pressures.

## 2018-08-06 MED FILL — AMPHETAMINE-DEXTROAMPHETAMI: 20 | 30 days supply | Qty: 60 | Fill #0

## 2018-08-18 ENCOUNTER — Encounter: Payer: Self-pay | Admitting: Sports Medicine

## 2018-08-18 DIAGNOSIS — M1A0790 Idiopathic chronic gout, unspecified ankle and foot, without tophus (tophi): Secondary | ICD-10-CM

## 2018-08-19 MED ORDER — PANTOPRAZOLE SODIUM 40 MG PO TBEC: 40.0000 mg | DELAYED_RELEASE_TABLET | Freq: Every day | ORAL | 3 refills | Status: DC

## 2018-08-19 MED ORDER — FEBUXOSTAT 80 MG PO TABS: 80.0000 mg | ORAL_TABLET | Freq: Every day | ORAL | 3 refills | Status: DC

## 2018-08-19 MED FILL — PANTOPRAZOLE SOD DR 40 MG T: 40 | 90 days supply | Qty: 90 | Fill #0

## 2018-08-19 MED FILL — FEBUXOSTAT 80 MG TABS: 80 | 90 days supply | Qty: 90 | Fill #0

## 2018-08-22 ENCOUNTER — Telehealth: Payer: Self-pay | Admitting: Sports Medicine

## 2018-08-22 DIAGNOSIS — Z20822 Contact with and (suspected) exposure to covid-19: Secondary | ICD-10-CM

## 2018-08-22 DIAGNOSIS — M1A0790 Idiopathic chronic gout, unspecified ankle and foot, without tophus (tophi): Secondary | ICD-10-CM

## 2018-08-22 DIAGNOSIS — Z20828 Contact with and (suspected) exposure to other viral communicable diseases: Secondary | ICD-10-CM

## 2018-08-22 DIAGNOSIS — I10 Essential (primary) hypertension: Secondary | ICD-10-CM

## 2018-08-22 NOTE — Telephone Encounter (Signed)
[name] testing

## 2018-08-26 DIAGNOSIS — Z20828 Contact with and (suspected) exposure to other viral communicable diseases: Secondary | ICD-10-CM | POA: Diagnosis not present

## 2018-08-26 DIAGNOSIS — M1A0790 Idiopathic chronic gout, unspecified ankle and foot, without tophus (tophi): Secondary | ICD-10-CM | POA: Diagnosis not present

## 2018-08-26 DIAGNOSIS — I10 Essential (primary) hypertension: Secondary | ICD-10-CM | POA: Diagnosis not present

## 2018-08-28 LAB — HEMOGLOBIN A1C
Hgb A1c MFr Bld: 4.6 % of total Hgb (ref <5.7)
Mean Plasma Glucose: 85 (calc)
eAG (mmol/L): 4.7 (calc)

## 2018-08-28 LAB — CBC
HCT: 43.7 % (ref 38.5–50.0)
Hemoglobin: 15.4 g/dL (ref 13.2–17.1)
MCH: 30.4 pg (ref 27.0–33.0)
MCHC: 35.2 g/dL (ref 32.0–36.0)
MCV: 86.2 fL (ref 80.0–100.0)
MPV: 11.2 fL (ref 7.5–12.5)
Platelets: 218 10*3/uL (ref 140–400)
RBC: 5.07 10*6/uL (ref 4.20–5.80)
RDW: 13.1 % (ref 11.0–15.0)
WBC: 4.3 10*3/uL (ref 3.8–10.8)

## 2018-08-28 LAB — LIPID PANEL W/REFLEX DIRECT LDL
Cholesterol: 191 mg/dL (ref <200)
HDL: 53 mg/dL (ref >=40)
LDL Cholesterol (Calc): 116 mg/dL (calc) — ABNORMAL HIGH
Non-HDL Cholesterol (Calc): 138 mg/dL (calc) — ABNORMAL HIGH (ref <130)
Total CHOL/HDL Ratio: 3.6 (calc) (ref <5.0)
Triglycerides: 109 mg/dL (ref <150)

## 2018-08-28 LAB — COMPLETE METABOLIC PANEL WITH GFR
AG Ratio: 1.9 (calc) (ref 1.0–2.5)
ALT: 78 U/L — ABNORMAL HIGH (ref 9–46)
AST: 44 U/L — ABNORMAL HIGH (ref 10–40)
Albumin: 4.8 g/dL (ref 3.6–5.1)
Alkaline phosphatase (APISO): 47 U/L (ref 36–130)
BUN: 13 mg/dL (ref 7–25)
CO2: 30 mmol/L (ref 20–32)
Calcium: 9.9 mg/dL (ref 8.6–10.3)
Chloride: 101 mmol/L (ref 98–110)
Creat: 0.85 mg/dL (ref 0.60–1.35)
GFR, Est African American: 128 mL/min/{1.73_m2} (ref >=60)
GFR, Est Non African American: 111 mL/min/{1.73_m2} (ref >=60)
Globulin: 2.5 g/dL (calc) (ref 1.9–3.7)
Glucose, Bld: 91 mg/dL (ref 65–99)
Potassium: 4 mmol/L (ref 3.5–5.3)
Sodium: 137 mmol/L (ref 135–146)
Total Bilirubin: 0.9 mg/dL (ref 0.2–1.2)
Total Protein: 7.3 g/dL (ref 6.1–8.1)

## 2018-08-28 LAB — VITAMIN D 25 HYDROXY (VIT D DEFICIENCY, FRACTURES): Vit D, 25-Hydroxy: 40 ng/mL (ref 30–100)

## 2018-08-28 LAB — SAR COV2 SEROLOGY (COVID19)AB(IGG),IA: SARS CoV2 AB IGG: NEGATIVE

## 2018-08-28 LAB — URIC ACID: Uric Acid, Serum: 5.6 mg/dL (ref 4.0–8.0)

## 2018-08-28 LAB — TSH: TSH: 0.83 mIU/L (ref 0.40–4.50)

## 2018-09-05 ENCOUNTER — Other Ambulatory Visit: Payer: Self-pay | Admitting: Sports Medicine

## 2018-09-05 DIAGNOSIS — F902 Attention-deficit hyperactivity disorder, combined type: Secondary | ICD-10-CM | POA: Diagnosis not present

## 2018-09-05 DIAGNOSIS — Z79899 Other long term (current) drug therapy: Secondary | ICD-10-CM | POA: Diagnosis not present

## 2018-09-05 DIAGNOSIS — I10 Essential (primary) hypertension: Secondary | ICD-10-CM

## 2018-09-05 MED ORDER — AMLODIPINE BESYLATE 10 MG PO TABS: 5.0000 mg | ORAL_TABLET | Freq: Every day | ORAL | 3 refills | Status: DC

## 2018-09-05 MED ORDER — HYDROCHLOROTHIAZIDE 25 MG PO TABS: 12.5000 mg | ORAL_TABLET | Freq: Every day | ORAL | 3 refills | Status: DC

## 2018-09-08 ENCOUNTER — Other Ambulatory Visit: Payer: Self-pay | Admitting: Sports Medicine

## 2018-09-08 DIAGNOSIS — I10 Essential (primary) hypertension: Secondary | ICD-10-CM

## 2018-09-08 MED ORDER — HYDROCHLOROTHIAZIDE 25 MG PO TABS: 25.0000 mg | ORAL_TABLET | Freq: Every day | ORAL | 3 refills | Status: DC

## 2018-09-08 MED ORDER — AMLODIPINE BESYLATE 10 MG PO TABS: 10.0000 mg | ORAL_TABLET | Freq: Every day | ORAL | 3 refills | Status: DC

## 2018-09-08 MED FILL — AMLODIPINE BESYLATE 10 MG T: 10 | 90 days supply | Qty: 90 | Fill #0

## 2018-09-08 MED FILL — AMPHETAMINE SULFATE 10 MG T: 10 | 30 days supply | Qty: 30 | Fill #0

## 2018-09-08 MED FILL — HYDROCHLOROTHIAZIDE 25 MG T: 25 | 90 days supply | Qty: 90 | Fill #0

## 2018-09-12 ENCOUNTER — Ambulatory Visit (INDEPENDENT_AMBULATORY_CARE_PROVIDER_SITE_OTHER): Payer: 59 | Admitting: Sports Medicine

## 2018-09-12 DIAGNOSIS — L723 Sebaceous cyst: Secondary | ICD-10-CM

## 2018-09-12 NOTE — Assessment & Plan Note (Signed)
Full excision, primary closure. Return to see me in 1 to 2 weeks for wound check.

## 2018-09-12 NOTE — Progress Notes (Signed)
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  Symptoms are moderate, persistent, localized without radiation, agreeable to consider excision today.  I reviewed the past medical history, family history, social history, surgical history, and allergies today and no changes were needed.  Please see the problem list section below in epic for further details.  Past Medical History: No past medical history on file. Past Surgical History: No past surgical history on file. Social History: Social History   Socioeconomic History  . Marital status: Married    Spouse name: Not on file  . Number of children: Not on file  . Years of education: Not on file  . Highest education level: Not on file  Occupational History  . Not on file  Social Needs  . Financial resource strain: Not on file  . Food insecurity:    Worry: Not on file    Inability: Not on file  . Transportation needs:    Medical: Not on file    Non-medical: Not on file  Tobacco Use  . Smoking status: Never Smoker  . Smokeless tobacco: Never Used  Substance and Sexual Activity  . Alcohol use: Yes    Alcohol/week: 0.0 standard drinks  . Drug use: No  . Sexual activity: Yes    Partners: Female  Lifestyle  . Physical activity:    Days per week: Not on file    Minutes per session: Not on file  . Stress: Not on file  Relationships  . Social connections:    Talks on phone: Not on file    Gets together: Not on file    Attends religious service: Not on file    Active member of club or organization: Not on file    Attends meetings of clubs or organizations: Not on file    Relationship status: Not on file  Other Topics Concern  . Not on file  Social History Narrative  . Not on file   Family History: No family history on file. Allergies: Allergies  Allergen Reactions  . Sulfa Antibiotics  Nausea And Vomiting   Medications: See med rec.  Review of Systems: No fevers, chills, night sweats, weight loss, chest pain, or shortness of breath.   Objective:    General: Well Developed, well nourished, and in no acute distress.  Neuro: Alert and oriented x3, extra-ocular muscles intact, sensation grossly intact.  HEENT: Normocephalic, atraumatic, pupils equal round reactive to light, neck supple, no masses, no lymphadenopathy, thyroid nonpalpable.  Skin: Warm and dry, no rashes.  There is a 2.5 cm well-circumscribed mass on the left upper back. Cardiac: Regular rate and rhythm, no murmurs rubs or gallops, no lower extremity edema.  Respiratory: Clear to auscultation bilaterally. Not using accessory muscles, speaking in full sentences.  Procedure:  Excision of left upper back 2.5 cm sebaceous cyst Risks, benefits, and alternatives explained and consent obtained. Time out conducted. Surface prepped with alcohol. 5cc lidocaine with epinephine infiltrated in a field block. Adequate anesthesia ensured. Area prepped and draped in a sterile fashion. Excision performed with: Using a #10 blade I made elliptical incision over the mass, then using both sharp and blunt dissection I removed the mass, I ensured glistening subcutaneous tissue remained with full removal of the cyst capsule and then closed the incision starting with #2 3-0 simple interrupted deep dermal Vicryl sutures.  I then  closed the skin with 3-0 running subcuticular Vicryl. Hemostasis achieved. Pt stable.  Impression and Recommendations:    Sebaceous cyst Full excision, primary closure. Return to see me in 1 to 2 weeks for wound check.   ___________________________________________ Gwen Her. Dianah Field, M.D., ABFM., CAQSM. Primary Care and Sports Medicine [facility]  Adjunct Professor of [employer] [facility] of [employer]

## 2018-09-16 ENCOUNTER — Telehealth: Payer: Self-pay | Admitting: Sports Medicine

## 2018-09-16 MED ORDER — DOXYCYCLINE HYCLATE 100 MG PO TABS: 100.0000 mg | ORAL_TABLET | Freq: Two times a day (BID) | ORAL | 0 refills | Status: AC

## 2018-09-16 MED FILL — DOXYCYCLINE HYCLATE 100 MG: 100 | 7 days supply | Qty: 14 | Fill #0

## 2018-09-16 NOTE — Telephone Encounter (Signed)
Surgical site looks good.  There is a bit of erythema as we commonly see with sebaceous cyst excisions, I am going to be safe and do a course of doxycycline.

## 2018-10-06 MED FILL — AMPHETAMINE SULFATE 10 MG T: 10 | 30 days supply | Qty: 60 | Fill #0

## 2018-11-11 MED FILL — AMPHETAMINE SULFATE 10 MG T: 10 | 30 days supply | Qty: 60 | Fill #0

## 2018-11-12 ENCOUNTER — Ambulatory Visit (INDEPENDENT_AMBULATORY_CARE_PROVIDER_SITE_OTHER): Payer: 59 | Admitting: Sports Medicine

## 2018-11-12 ENCOUNTER — Encounter: Payer: Self-pay | Admitting: Sports Medicine

## 2018-11-12 DIAGNOSIS — M1A0790 Idiopathic chronic gout, unspecified ankle and foot, without tophus (tophi): Secondary | ICD-10-CM | POA: Diagnosis not present

## 2018-11-12 DIAGNOSIS — E785 Hyperlipidemia, unspecified: Secondary | ICD-10-CM | POA: Diagnosis not present

## 2018-11-12 DIAGNOSIS — L723 Sebaceous cyst: Secondary | ICD-10-CM

## 2018-11-12 DIAGNOSIS — Z0000 Encounter for general adult medical examination without abnormal findings: Secondary | ICD-10-CM

## 2018-11-12 DIAGNOSIS — I10 Essential (primary) hypertension: Secondary | ICD-10-CM | POA: Diagnosis not present

## 2018-11-12 NOTE — Assessment & Plan Note (Signed)
Well controlled 

## 2018-11-12 NOTE — Assessment & Plan Note (Signed)
Well-controlled, no changes in medication.

## 2018-11-12 NOTE — Progress Notes (Signed)
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  His practice is going well, and he is emotionally healthy.  He feels as though he may have had a bit of drainage from the surgical site where we removed a sebaceous cyst, but things are going well today.  I reviewed the past medical history, family history, social history, surgical history, and allergies today and no changes were needed.  Please see the problem list section below in epic for further details.  Past Medical History: No past medical history on file. Past Surgical History: No past surgical history on file. Social History: Social History   Socioeconomic History  . Marital status: Married    Spouse name: Not on file  . Number of children: Not on file  . Years of education: Not on file  . Highest education level: Not on file  Occupational History  . Not on file  Social Needs  . Financial resource strain: Not on file  . Food insecurity    Worry: Not on file    Inability: Not on file  . Transportation needs    Medical: Not on file    Non-medical: Not on file  Tobacco Use  . Smoking status: Never Smoker  . Smokeless tobacco: Never Used  Substance and Sexual Activity  . Alcohol use: Yes    Alcohol/week: 0.0 standard drinks  . Drug use: No  . Sexual activity: Yes    Partners: Female  Lifestyle  . Physical activity    Days per week: Not on file    Minutes per session: Not on file  . Stress: Not on file  Relationships  . Social Herbalist on phone: Not on file    Gets together: Not on file    Attends religious service: Not on file    Active member of club or organization: Not on file    Attends meetings of clubs or organizations: Not on file    Relationship status: Not on file   Other Topics Concern  . Not on file  Social History Narrative  . Not on file   Family History: No family history on file. Allergies: Allergies  Allergen Reactions  . Sulfa Antibiotics Nausea And Vomiting   Medications: See med rec.  Review of Systems: No headache, visual changes, nausea, vomiting, diarrhea, constipation, dizziness, abdominal pain, skin rash, fevers, chills, night sweats, swollen lymph nodes, weight loss, chest pain, body aches, joint swelling, muscle aches, shortness of breath, mood changes, visual or auditory hallucinations.  Objective:    General: Well Developed, well nourished, and in no acute distress.  Neuro: Alert and oriented x3, extra-ocular muscles intact, sensation grossly intact. Cranial nerves II through XII are intact, motor, sensory, and coordinative functions are all intact. HEENT: Normocephalic, atraumatic, pupils equal round reactive to light, neck supple, no masses, no lymphadenopathy, thyroid nonpalpable. Oropharynx, nasopharynx, external ear canals are unremarkable. Skin: Warm and dry, no rashes noted.  Cardiac: Regular rate and rhythm, no murmurs rubs or gallops.  Respiratory: Clear to auscultation bilaterally. Not using accessory muscles, speaking in full sentences.  Abdominal: Soft, nontender, nondistended, positive bowel sounds, no masses, no organomegaly.  Musculoskeletal: Shoulder, elbow, wrist, hip, knee, ankle stable, and with full range of motion.  Impression  and Recommendations:    The patient was counselled, risk factors were discussed, anticipatory guidance given.  Annual physical exam Annual physical as above, healthy male.  Sebaceous cyst He may have had some suture reaction but the incision looks great today.  Hyperlipidemia Well-controlled.  Gout Well-controlled.  Essential hypertension, benign Well-controlled, no changes in medication.   ___________________________________________ Gwen Her. Dianah Field, M.D.,  ABFM., CAQSM. Primary Care and Sports Medicine [facility]  Adjunct Professor of [employer] [place] [facility] of Medicine

## 2018-11-12 NOTE — Assessment & Plan Note (Signed)
Annual physical as above, healthy male.

## 2018-11-12 NOTE — Assessment & Plan Note (Signed)
He may have had some suture reaction but the incision looks great today.

## 2018-11-15 MED FILL — FEBUXOSTAT 80 MG TABS: 80 | 90 days supply | Qty: 90 | Fill #1

## 2018-11-15 MED FILL — PANTOPRAZOLE SOD DR 40 MG T: 40 | 90 days supply | Qty: 90 | Fill #1

## 2018-11-17 ENCOUNTER — Other Ambulatory Visit: Payer: Self-pay | Admitting: Sports Medicine

## 2018-11-17 DIAGNOSIS — Z1383 Encounter for screening for respiratory disorder NEC: Secondary | ICD-10-CM | POA: Insufficient documentation

## 2018-11-17 NOTE — Assessment & Plan Note (Signed)
COVID-19 screening nasopharyngeal swab prior to vacation.

## 2018-12-01 LAB — SARS-COV-2 RNA,(COVID-19) QUALITATIVE NAAT: SARS CoV2 RNA: NOT DETECTED

## 2018-12-02 MED FILL — AMLODIPINE BESYLATE 10 MG T: 10 | 90 days supply | Qty: 45 | Fill #1

## 2018-12-02 MED FILL — HYDROCHLOROTHIAZIDE 25 MG T: 25 | 90 days supply | Qty: 45 | Fill #1

## 2018-12-03 ENCOUNTER — Telehealth: Payer: Self-pay | Admitting: Family Medicine

## 2018-12-03 NOTE — Telephone Encounter (Signed)
Needs documentation of COVID testing for travel.  Letter provided.

## 2018-12-19 DIAGNOSIS — F902 Attention-deficit hyperactivity disorder, combined type: Secondary | ICD-10-CM | POA: Diagnosis not present

## 2018-12-19 DIAGNOSIS — Z79899 Other long term (current) drug therapy: Secondary | ICD-10-CM | POA: Diagnosis not present

## 2018-12-19 MED FILL — DEXTROAMPHETAMINE 10 MG TAB: 10 | 30 days supply | Qty: 60 | Fill #0

## 2019-01-23 MED FILL — HYDROCHLOROTHIAZIDE 25 MG T: 25 | 90 days supply | Qty: 90 | Fill #1

## 2019-01-23 MED FILL — AMLODIPINE BESYLATE 10 MG T: 10 | 90 days supply | Qty: 90 | Fill #1

## 2019-01-30 ENCOUNTER — Other Ambulatory Visit: Payer: Self-pay | Admitting: Sports Medicine

## 2019-01-30 MED ORDER — TADALAFIL 20 MG PO TABS: 20.0000 mg | ORAL_TABLET | Freq: Every day | ORAL | 11 refills | Status: DC | PRN

## 2019-02-05 ENCOUNTER — Other Ambulatory Visit: Payer: Self-pay | Admitting: Sports Medicine

## 2019-02-05 MED ORDER — DOXYCYCLINE HYCLATE 100 MG PO TABS: 100.0000 mg | ORAL_TABLET | Freq: Two times a day (BID) | ORAL | 0 refills | Status: AC

## 2019-02-05 MED FILL — DOXYCYCLINE HYCLATE 100 MG: 100 | 7 days supply | Qty: 14 | Fill #0

## 2019-02-11 MED FILL — DEXTROAMPHETAMINE 10 MG TAB: 10 | 30 days supply | Qty: 60 | Fill #0

## 2019-02-16 MED FILL — FEBUXOSTAT 80 MG TABS: 80 | 90 days supply | Qty: 90 | Fill #2

## 2019-02-16 MED FILL — PANTOPRAZOLE SOD DR 40 MG T: 40 | 90 days supply | Qty: 90 | Fill #2

## 2019-03-20 DIAGNOSIS — F902 Attention-deficit hyperactivity disorder, combined type: Secondary | ICD-10-CM | POA: Diagnosis not present

## 2019-03-20 DIAGNOSIS — Z79899 Other long term (current) drug therapy: Secondary | ICD-10-CM | POA: Diagnosis not present

## 2019-03-20 MED FILL — AMPHETAMINE SULFATE 10 MG T: 10 | 30 days supply | Qty: 60 | Fill #0

## 2019-03-24 ENCOUNTER — Other Ambulatory Visit: Payer: Self-pay | Admitting: Sports Medicine

## 2019-04-10 ENCOUNTER — Encounter: Payer: Self-pay | Admitting: Sports Medicine

## 2019-04-10 ENCOUNTER — Ambulatory Visit (INDEPENDENT_AMBULATORY_CARE_PROVIDER_SITE_OTHER): Payer: 59 | Admitting: Sports Medicine

## 2019-04-10 ENCOUNTER — Other Ambulatory Visit: Payer: Self-pay

## 2019-04-10 ENCOUNTER — Other Ambulatory Visit: Payer: Self-pay | Admitting: Sports Medicine

## 2019-04-10 DIAGNOSIS — D229 Melanocytic nevi, unspecified: Secondary | ICD-10-CM | POA: Diagnosis not present

## 2019-04-10 DIAGNOSIS — R233 Spontaneous ecchymoses: Secondary | ICD-10-CM | POA: Diagnosis not present

## 2019-04-10 NOTE — Assessment & Plan Note (Signed)
Small nevus, shave biopsy, return as needed.

## 2019-04-10 NOTE — Addendum Note (Signed)
Addended by: Jamesetta So on: 04/10/2019 03:39 PM   Modules accepted: Orders

## 2019-04-10 NOTE — Progress Notes (Signed)
   Procedure:  Excision of subcentimeter nevus right index finger, PIP Risks, benefits, and alternatives explained and consent obtained. Time out conducted. Surface prepped with alcohol. 1cc lidocaine with epinephine infiltrated in a field block. Adequate anesthesia ensured. Area prepped and draped in a sterile fashion. Excision performed with: Using a derma blade I performed a shave biopsy into the deep dermis, we then used a Hyfrecator to achieve hemostasis. Pt stable.

## 2019-04-20 MED FILL — HYDROCHLOROTHIAZIDE 25 MG T: 25 | 90 days supply | Qty: 90 | Fill #2

## 2019-04-20 MED FILL — AMLODIPINE BESYLATE 10 MG T: 10 | 90 days supply | Qty: 90 | Fill #2

## 2019-04-23 ENCOUNTER — Other Ambulatory Visit: Payer: Self-pay | Admitting: Sports Medicine

## 2019-04-23 MED ORDER — TRIAMCINOLONE ACETONIDE 0.1 % EX CREA: 1.0000 "application " | TOPICAL_CREAM | Freq: Two times a day (BID) | CUTANEOUS | 11 refills | Status: DC

## 2019-04-23 MED FILL — TRIAMCINOLONE ACETONIDE 0.1: 0.1 | 30 days supply | Qty: 454 | Fill #0

## 2019-05-05 MED FILL — AMPHETAMINE SULFATE 10 MG T: 10 | 30 days supply | Qty: 60 | Fill #0

## 2019-05-16 MED FILL — PANTOPRAZOLE SOD DR 40 MG T: 40 | 90 days supply | Qty: 90 | Fill #3

## 2019-05-16 MED FILL — FEBUXOSTAT 80 MG TABS: 80 | 90 days supply | Qty: 90 | Fill #3

## 2019-05-27 IMAGING — XA Imaging study
2 series · 2 of 2 positions shown · non-contrast
Comparison: none

CLINICAL DATA: Lumbosacral spondylosis without myelopathy. L5 pars
defects with grade 1 anterolisthesis at L5-S1 and worsening left
greater than right neural foraminal stenosis. Left leg radicular
symptoms with shorter duration of relief from the transforaminal
epidural injection in [DATE] compared to the same injection in
2832. Right leg symptoms developing now as well.

[Series 1: ortho adipose · 1 of 1 slices shown (1 of 2)]
[im 1/1]
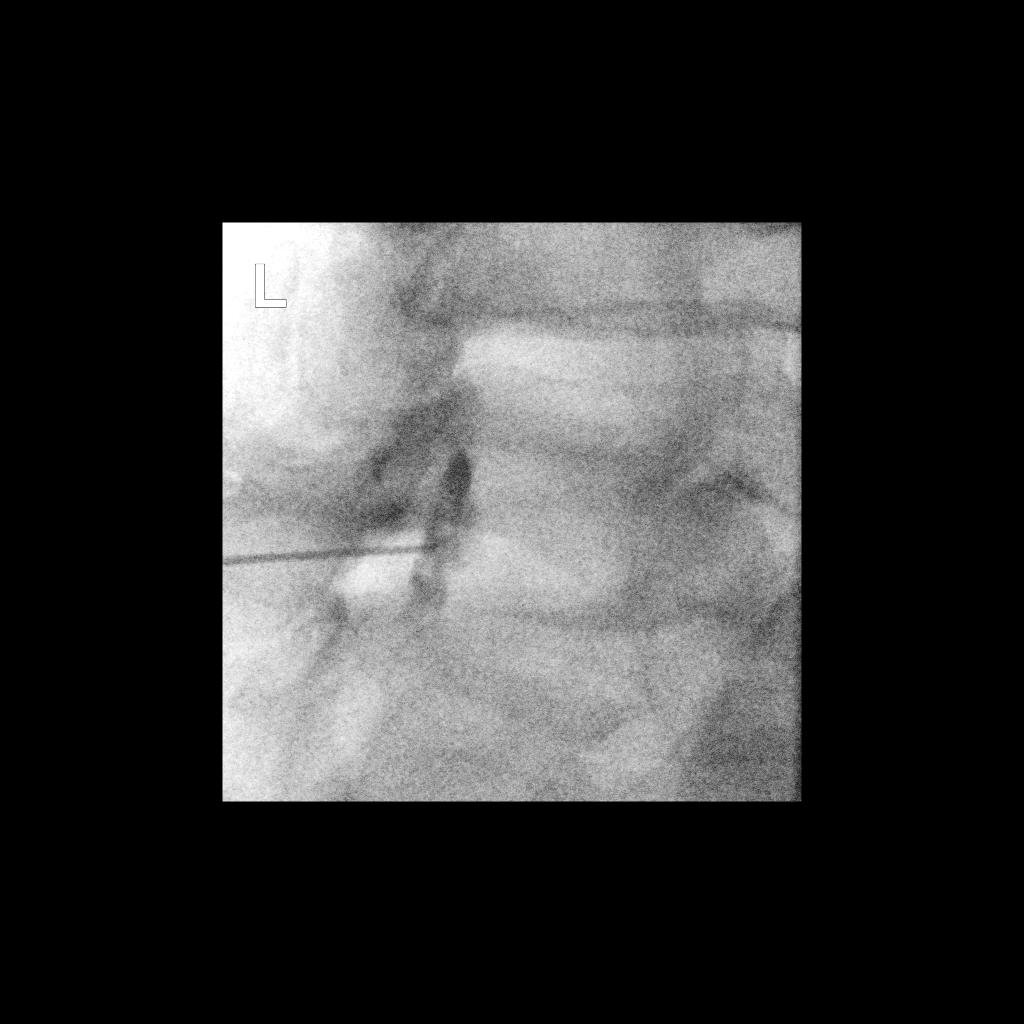

[Series 2: ortho adipose · 1 of 1 slices shown (2 of 2)]
[im 1/1]
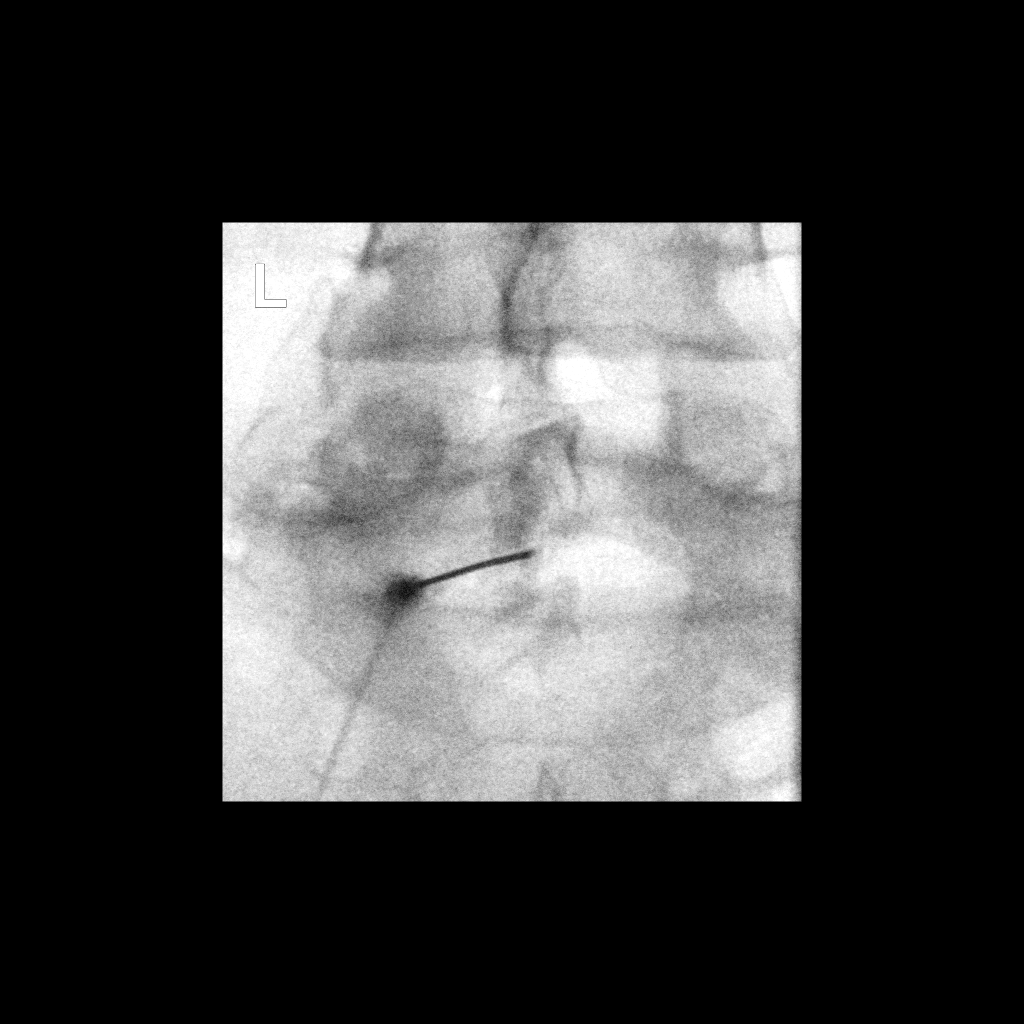

[2 of 2 positions shown; findings below may reference images not displayed]

FLUOROSCOPY TIME:  Radiation Exposure Index (as provided by the
fluoroscopic device): 20.42 microGray*m^2

Fluoroscopy Time (in minutes and seconds):  7 seconds

PROCEDURE:
The procedure, risks, benefits, and alternatives were explained to
the patient. Questions regarding the procedure were encouraged and
answered. The patient understands and consents to the procedure.

LUMBAR EPIDURAL INJECTION:

An interlaminar approach was performed on the left at L5-S1. The
overlying skin was cleansed and anesthetized. A 3.5 inch 20 gauge
epidural needle was advanced using loss-of-resistance technique.

DIAGNOSTIC EPIDURAL INJECTION:

Injection of Isovue-M 200 shows a good epidural pattern with spread
above and below the level of needle placement, primarily on the
left. No vascular opacification is seen.

THERAPEUTIC EPIDURAL INJECTION:

120 mg of Depo-Medrol mixed with 3 mL of 1% lidocaine were
instilled. The procedure was well-tolerated, and the patient was
discharged 20 minutes following the injection in good condition.

COMPLICATIONS:
None
IMPRESSION: Technically successful lumbar interlaminar epidural injection on the
left at L5-S1.

## 2019-06-12 DIAGNOSIS — Z79899 Other long term (current) drug therapy: Secondary | ICD-10-CM | POA: Diagnosis not present

## 2019-06-12 DIAGNOSIS — F902 Attention-deficit hyperactivity disorder, combined type: Secondary | ICD-10-CM | POA: Diagnosis not present

## 2019-06-12 DIAGNOSIS — F419 Anxiety disorder, unspecified: Secondary | ICD-10-CM | POA: Diagnosis not present

## 2019-06-12 MED FILL — AMPHETAMINE SULFATE 10 MG T: 10 | 30 days supply | Qty: 60 | Fill #0

## 2019-07-19 MED FILL — AMLODIPINE BESYLATE 10 MG T: 10 | 90 days supply | Qty: 90 | Fill #3

## 2019-07-19 MED FILL — HYDROCHLOROTHIAZIDE 25 MG T: 25 | 90 days supply | Qty: 90 | Fill #3

## 2019-08-17 ENCOUNTER — Other Ambulatory Visit: Payer: Self-pay | Admitting: Sports Medicine

## 2019-08-17 DIAGNOSIS — M1A0790 Idiopathic chronic gout, unspecified ankle and foot, without tophus (tophi): Secondary | ICD-10-CM

## 2019-08-17 MED FILL — PANTOPRAZOLE SOD DR 40 MG T: 40 | 90 days supply | Qty: 90 | Fill #0

## 2019-08-17 MED FILL — FEBUXOSTAT 80 MG TABS: 80 | 90 days supply | Qty: 90 | Fill #0

## 2019-08-18 MED FILL — AMPHETAMINE SULFATE 10 MG T: 10 | 30 days supply | Qty: 60 | Fill #0

## 2019-09-18 DIAGNOSIS — F902 Attention-deficit hyperactivity disorder, combined type: Secondary | ICD-10-CM | POA: Diagnosis not present

## 2019-09-18 DIAGNOSIS — Z79899 Other long term (current) drug therapy: Secondary | ICD-10-CM | POA: Diagnosis not present

## 2019-09-23 ENCOUNTER — Other Ambulatory Visit: Payer: Self-pay | Admitting: Family Medicine

## 2019-09-23 MED ORDER — DOXYCYCLINE HYCLATE 100 MG PO TABS: 100.0000 mg | ORAL_TABLET | Freq: Two times a day (BID) | ORAL | 0 refills | Status: AC

## 2019-09-23 MED FILL — DOXYCYCLINE HYCLATE 100 MG: 100 | 10 days supply | Qty: 20 | Fill #0

## 2019-09-23 NOTE — Progress Notes (Signed)
Send in doxy for recent tick bite, lasted 48 hours

## 2019-10-17 ENCOUNTER — Other Ambulatory Visit: Payer: Self-pay | Admitting: Sports Medicine

## 2019-10-17 DIAGNOSIS — I10 Essential (primary) hypertension: Secondary | ICD-10-CM

## 2019-11-05 NOTE — Patient Instructions (Addendum)
Blood work was ordered.    All other Health Maintenance issues reviewed.   All recommended immunizations and age-appropriate screenings are up-to-date or discussed.  No immunization administered today.   Medications reviewed and updated.  Changes include :   none  Your prescription(s) have been submitted to your pharmacy. Please take as directed and contact our office if you believe you are having problem(s) with the medication(s).     Please followup in 1 year    Health Maintenance, Male Adopting a healthy lifestyle and getting preventive care are important in promoting health and wellness. Ask your health care provider about:  The right schedule for you to have regular tests and exams.  Things you can do on your own to prevent diseases and keep yourself healthy. What should I know about diet, weight, and exercise? Eat a healthy diet   Eat a diet that includes plenty of vegetables, fruits, low-fat dairy products, and lean protein.  Do not eat a lot of foods that are high in solid fats, added sugars, or sodium. Maintain a healthy weight Body mass index (BMI) is a measurement that can be used to identify possible weight problems. It estimates body fat based on height and weight. Your health care provider can help determine your BMI and help you achieve or maintain a healthy weight. Get regular exercise Get regular exercise. This is one of the most important things you can do for your health. Most adults should:  Exercise for at least 150 minutes each week. The exercise should increase your heart rate and make you sweat (moderate-intensity exercise).  Do strengthening exercises at least twice a week. This is in addition to the moderate-intensity exercise.  Spend less time sitting. Even light physical activity can be beneficial. Watch cholesterol and blood lipids Have your blood tested for lipids and cholesterol at 39 years of age, then have this test every 5 years. You may  need to have your cholesterol levels checked more often if:  Your lipid or cholesterol levels are high.  You are older than 40 years of age.  You are at high risk for heart disease. What should I know about cancer screening? Many types of cancers can be detected early and may often be prevented. Depending on your health history and family history, you may need to have cancer screening at various ages. This may include screening for:  Colorectal cancer.  Prostate cancer.  Skin cancer.  Lung cancer. What should I know about heart disease, diabetes, and high blood pressure? Blood pressure and heart disease  High blood pressure causes heart disease and increases the risk of stroke. This is more likely to develop in people who have high blood pressure readings, are of African descent, or are overweight.  Talk with your health care provider about your target blood pressure readings.  Have your blood pressure checked: ? Every 3-5 years if you are 18-39 years of age. ? Every year if you are 40 years old or older.  If you are between the ages of 65 and 75 and are a current or former smoker, ask your health care provider if you should have a one-time screening for abdominal aortic aneurysm (AAA). Diabetes Have regular diabetes screenings. This checks your fasting blood sugar level. Have the screening done:  Once every three years after age 45 if you are at a normal weight and have a low risk for diabetes.  More often and at a younger age if you are overweight or have   a high risk for diabetes. What should I know about preventing infection? Hepatitis B If you have a higher risk for hepatitis B, you should be screened for this virus. Talk with your health care provider to find out if you are at risk for hepatitis B infection. Hepatitis C Blood testing is recommended for:  Everyone born from 1945 through 1965.  Anyone with known risk factors for hepatitis C. Sexually transmitted  infections (STIs)  You should be screened each year for STIs, including gonorrhea and chlamydia, if: ? You are sexually active and are younger than 39 years of age. ? You are older than 39 years of age and your health care provider tells you that you are at risk for this type of infection. ? Your sexual activity has changed since you were last screened, and you are at increased risk for chlamydia or gonorrhea. Ask your health care provider if you are at risk.  Ask your health care provider about whether you are at high risk for HIV. Your health care provider may recommend a prescription medicine to help prevent HIV infection. If you choose to take medicine to prevent HIV, you should first get tested for HIV. You should then be tested every 3 months for as long as you are taking the medicine. Follow these instructions at home: Lifestyle  Do not use any products that contain nicotine or tobacco, such as cigarettes, e-cigarettes, and chewing tobacco. If you need help quitting, ask your health care provider.  Do not use street drugs.  Do not share needles.  Ask your health care provider for help if you need support or information about quitting drugs. Alcohol use  Do not drink alcohol if your health care provider tells you not to drink.  If you drink alcohol: ? Limit how much you have to 0-2 drinks a day. ? Be aware of how much alcohol is in your drink. In the [place] one drink equals one 12 oz bottle of beer (355 mL), one 5 oz glass of wine (148 mL), or one 1 oz glass of hard liquor (44 mL). General instructions  Schedule regular health, dental, and eye exams.  Stay current with your vaccines.  Tell your health care provider if: ? You often feel depressed. ? You have ever been abused or do not feel safe at home. Summary  Adopting a healthy lifestyle and getting preventive care are important in promoting health and wellness.  Follow your health care provider's instructions about  healthy diet, exercising, and getting tested or screened for diseases.  Follow your health care provider's instructions on monitoring your cholesterol and blood pressure. This information is not intended to replace advice given to you by your health care provider. Make sure you discuss any questions you have with your health care provider. Document Revised: 04/16/2018 Document Reviewed: 04/16/2018 Elsevier Patient Education  2020 [employer]  

## 2019-11-05 NOTE — Progress Notes (Signed)
Subjective:    Patient ID: Andrew Gray, male    DOB: 1980/06/23, 39 y.o.   MRN: [id number]  HPI He is here for a physical exam.   He is here to establish care.  He has no major concerns.  Medications and allergies reviewed with patient and updated if appropriate.  Patient Active Problem List   Diagnosis Date Noted  . Nevus 04/10/2019  . Sebaceous cyst 09/12/2018  . Right hip osteoarthritis and labral tearing 01/23/2018  . Left knee pain 12/17/2017  . Rosacea 10/11/2017  . Adult ADHD 10/11/2017  . Hyperlipidemia 02/23/2015  . Essential hypertension, benign 02/23/2015  . Gout 02/16/2015  . Spondylolisthesis at L5-S1 level 02/15/2015    Current Outpatient Medications on File Prior to Visit  Medication Sig Dispense Refill  . amLODipine (NORVASC) 10 MG tablet TAKE 1 TABLET (10 MG TOTAL) BY MOUTH DAILY. 90 tablet 3  . Amphetamine Sulfate 10 MG TABS     . Azelaic Acid 15 % cream Apply 1 application topically 2 (two) times daily. (Patient taking differently: Apply 1 application topically as needed. ) 30 g 11  . cetirizine (ZYRTEC) 10 MG tablet Take 10 mg by mouth daily.    . Febuxostat 80 MG TABS TAKE 1 TABLET (80 MG TOTAL) BY MOUTH DAILY. 90 tablet 3  . gabapentin (NEURONTIN) 300 MG capsule Take 1 capsule (300 mg total) by mouth at bedtime. (Patient taking differently: Take 300 mg by mouth as needed. ) 90 capsule 3  . hydrochlorothiazide (HYDRODIURIL) 25 MG tablet TAKE 1 TABLET (25 MG TOTAL) BY MOUTH DAILY. 90 tablet 3  . pantoprazole (PROTONIX) 40 MG tablet TAKE 1 TABLET BY MOUTH ONCE DAILY 90 tablet 3  . tadalafil (CIALIS) 20 MG tablet Take 1 tablet (20 mg total) by mouth daily as needed for erectile dysfunction (please use troches). 30 tablet 11  . traZODone (DESYREL) 50 MG tablet Take 50-100 mg by mouth at bedtime.    . triamcinolone cream (KENALOG) 0.1 % Apply 1 application topically 2 (two) times daily. 450 g 11   No current facility-administered medications on file prior  to visit.    History reviewed. No pertinent past medical history.  History reviewed. No pertinent surgical history.  Social History   Socioeconomic History  . Marital status: Married    Spouse name: Not on file  . Number of children: Not on file  . Years of education: Not on file  . Highest education level: Not on file  Occupational History  . Not on file  Tobacco Use  . Smoking status: Never Smoker  . Smokeless tobacco: Never Used  Substance and Sexual Activity  . Alcohol use: Yes    Alcohol/week: 0.0 standard drinks  . Drug use: No  . Sexual activity: Yes    Partners: Female  Other Topics Concern  . Not on file  Social History Narrative  . Not on file   Social Determinants of Health   Financial Resource Strain:   . Difficulty of Paying Living Expenses:   Food Insecurity:   . Worried About Charity fundraiser in the Last Year:   . Arboriculturist in the Last Year:   Transportation Needs:   . Film/video editor (Medical):   Marland Kitchen Lack of Transportation (Non-Medical):   Physical Activity:   . Days of Exercise per Week:   . Minutes of Exercise per Session:   Stress:   . Feeling of Stress :   Social Connections:   .  Frequency of Communication with Friends and Family:   . Frequency of Social Gatherings with Friends and Family:   . Attends Religious Services:   . Active Member of Clubs or Organizations:   . Attends Archivist Meetings:   Marland Kitchen Marital Status:     History reviewed. No pertinent family history.  Review of Systems  Constitutional: Negative for fever.  Eyes: Negative for visual disturbance.  Respiratory: Negative for cough, shortness of breath and wheezing.   Cardiovascular: Negative for chest pain, palpitations and leg swelling.  Gastrointestinal: Positive for constipation. Negative for abdominal pain, blood in stool, diarrhea and nausea.  Genitourinary: Negative for dysuria and hematuria.  Musculoskeletal: Positive for arthralgias and  back pain.  Skin: Negative for rash.  Neurological: Negative for dizziness, light-headedness and headaches.  Psychiatric/Behavioral: Negative for dysphoric mood. The patient is not nervous/anxious.        Objective:   Vitals:   11/06/19 1306  BP: 124/88  Pulse: 68  Temp: 98 F (36.7 C)  SpO2: 98%   Filed Weights   11/06/19 1306  Weight: 256 lb (116.1 kg)   Body mass index is 34.72 kg/m.  BP Readings from Last 3 Encounters:  11/06/19 124/88  04/10/19 (!) 149/86  11/12/18 127/78    Wt Readings from Last 3 Encounters:  11/06/19 256 lb (116.1 kg)  04/10/19 261 lb (118.4 kg)  11/12/18 265 lb (120.2 kg)     Physical Exam Constitutional: He appears well-developed and well-nourished. No distress.  HENT:  Head: Normocephalic and atraumatic.  Right Ear: External ear normal.  Left Ear: External ear normal.  Mouth/Throat: Oropharynx is clear and moist.  Normal ear canals and TM b/l  Eyes: Conjunctivae and EOM are normal.  Neck: Neck supple. No tracheal deviation present. No thyromegaly present.  No carotid bruit  Cardiovascular: Normal rate, regular rhythm, normal heart sounds and intact distal pulses.   No murmur heard. Pulmonary/Chest: Effort normal and breath sounds normal. No respiratory distress. He has no wheezes. He has no rales.  Abdominal: Soft. He exhibits no distension. There is no tenderness.  Genitourinary: deferred  Musculoskeletal: He exhibits no edema.  Lymphadenopathy:   He has no cervical adenopathy.  Skin: Skin is warm and dry. He is not diaphoretic.  Psychiatric: He has a normal mood and affect. His behavior is normal.         Assessment & Plan:   Physical exam: Screening blood work  ordered Immunizations  Up to date    Exercise   ellipital, treadmill Weight working on weight loss Substance abuse   none  See Problem List for Assessment and Plan of chronic medical problems.   This visit occurred during the SARS-CoV-2 public health  emergency.  Safety protocols were in place, including screening questions prior to the visit, additional usage of staff PPE, and extensive cleaning of exam room while observing appropriate contact time as indicated for disinfecting solutions.

## 2019-11-06 ENCOUNTER — Other Ambulatory Visit: Payer: Self-pay | Admitting: Internal Medicine

## 2019-11-06 ENCOUNTER — Other Ambulatory Visit: Payer: Self-pay

## 2019-11-06 ENCOUNTER — Encounter: Payer: Self-pay | Admitting: Internal Medicine

## 2019-11-06 ENCOUNTER — Ambulatory Visit (INDEPENDENT_AMBULATORY_CARE_PROVIDER_SITE_OTHER): Payer: 59 | Admitting: Internal Medicine

## 2019-11-06 VITALS — BP 124/88 | HR 68 | Temp 98.0°F | Ht 72.0 in | Wt 256.0 lb

## 2019-11-06 DIAGNOSIS — I10 Essential (primary) hypertension: Secondary | ICD-10-CM | POA: Diagnosis not present

## 2019-11-06 DIAGNOSIS — M1A0790 Idiopathic chronic gout, unspecified ankle and foot, without tophus (tophi): Secondary | ICD-10-CM

## 2019-11-06 DIAGNOSIS — F909 Attention-deficit hyperactivity disorder, unspecified type: Secondary | ICD-10-CM

## 2019-11-06 DIAGNOSIS — E7849 Other hyperlipidemia: Secondary | ICD-10-CM | POA: Diagnosis not present

## 2019-11-06 DIAGNOSIS — M4317 Spondylolisthesis, lumbosacral region: Secondary | ICD-10-CM | POA: Diagnosis not present

## 2019-11-06 DIAGNOSIS — Z0000 Encounter for general adult medical examination without abnormal findings: Secondary | ICD-10-CM

## 2019-11-06 MED ORDER — PANTOPRAZOLE SODIUM 40 MG PO TBEC: 40.0000 mg | DELAYED_RELEASE_TABLET | Freq: Every day | ORAL | 3 refills | Status: DC

## 2019-11-06 MED ORDER — FEBUXOSTAT 80 MG PO TABS
80.0000 mg | ORAL_TABLET | Freq: Every day | ORAL | 3 refills | Status: DC
  Filled 2020-08-19: qty 90, 90d supply, fill #0

## 2019-11-06 MED FILL — FEBUXOSTAT 80 MG TABS: 80 | 90 days supply | Qty: 90 | Fill #0

## 2019-11-06 MED FILL — PANTOPRAZOLE SOD DR 40 MG T: 40 | 90 days supply | Qty: 90 | Fill #0

## 2019-11-06 NOTE — Assessment & Plan Note (Signed)
Chronic ?  Ideally controlled on not.  Diastolic slightly elevated Continue current medications for now He will try monitoring his blood pressure over the next 2 weeks so we can get a better picture of his average BP and make adjustments if needed CMP

## 2019-11-06 NOTE — Assessment & Plan Note (Signed)
Chronic Currently lifestyle controlled Check lipid panel, CMP Continue regular exercise

## 2019-11-06 NOTE — Assessment & Plan Note (Signed)
Chronic Has had 1-2 attacks in the past-no attacks in the past 5 years Well controlled with Uloric at current dose Continue Check uric acid level

## 2019-11-06 NOTE — Assessment & Plan Note (Signed)
[facility] Not taking medication daily at this point

## 2019-11-06 NOTE — Assessment & Plan Note (Signed)
Chronic Moderate spondylolisthesis Intermittent radiculopathy to left lower extremity Takes gabapentin as needed

## 2019-12-15 ENCOUNTER — Encounter: Payer: Self-pay | Admitting: Internal Medicine

## 2019-12-15 DIAGNOSIS — R0683 Snoring: Secondary | ICD-10-CM

## 2019-12-15 NOTE — Telephone Encounter (Signed)
Home Sleep Study has been ordered. I have a message to referral to check on the status in the next few days.

## 2019-12-25 ENCOUNTER — Other Ambulatory Visit: Payer: Self-pay

## 2019-12-25 ENCOUNTER — Ambulatory Visit: Payer: 59

## 2019-12-25 DIAGNOSIS — G4733 Obstructive sleep apnea (adult) (pediatric): Secondary | ICD-10-CM | POA: Diagnosis not present

## 2019-12-25 DIAGNOSIS — Z79899 Other long term (current) drug therapy: Secondary | ICD-10-CM | POA: Diagnosis not present

## 2019-12-25 DIAGNOSIS — F902 Attention-deficit hyperactivity disorder, combined type: Secondary | ICD-10-CM | POA: Diagnosis not present

## 2019-12-25 MED FILL — METHYLPHENIDATE ER 20 MG CA: 20 | 30 days supply | Qty: 30 | Fill #0

## 2019-12-29 DIAGNOSIS — G4733 Obstructive sleep apnea (adult) (pediatric): Secondary | ICD-10-CM | POA: Diagnosis not present

## 2019-12-30 ENCOUNTER — Other Ambulatory Visit: Payer: Self-pay | Admitting: Internal Medicine

## 2019-12-30 DIAGNOSIS — G4733 Obstructive sleep apnea (adult) (pediatric): Secondary | ICD-10-CM | POA: Insufficient documentation

## 2020-01-14 ENCOUNTER — Telehealth: Payer: Self-pay | Admitting: *Deleted

## 2020-01-14 ENCOUNTER — Other Ambulatory Visit: Payer: Self-pay | Admitting: Internal Medicine

## 2020-01-14 DIAGNOSIS — G4733 Obstructive sleep apnea (adult) (pediatric): Secondary | ICD-10-CM

## 2020-01-14 NOTE — Telephone Encounter (Signed)
signed

## 2020-01-14 NOTE — Telephone Encounter (Signed)
Mr. Andrew Gray for [facility] came out w/another order. He states the first order did not have the pressure settings and will need. Inform him will have MD to resign and fax back. Place new form on your desk.Marland KitchenJohny Chess

## 2020-01-14 NOTE — Telephone Encounter (Signed)
Refaxed back to [place].Marland KitchenJohny Chess

## 2020-01-15 ENCOUNTER — Other Ambulatory Visit (INDEPENDENT_AMBULATORY_CARE_PROVIDER_SITE_OTHER): Payer: 59

## 2020-01-15 ENCOUNTER — Other Ambulatory Visit: Payer: Self-pay

## 2020-01-15 DIAGNOSIS — I10 Essential (primary) hypertension: Secondary | ICD-10-CM | POA: Diagnosis not present

## 2020-01-15 DIAGNOSIS — Z0000 Encounter for general adult medical examination without abnormal findings: Secondary | ICD-10-CM

## 2020-01-15 DIAGNOSIS — M1A0790 Idiopathic chronic gout, unspecified ankle and foot, without tophus (tophi): Secondary | ICD-10-CM

## 2020-01-15 LAB — COMPREHENSIVE METABOLIC PANEL
ALT: 91 U/L — ABNORMAL HIGH (ref 0–53)
AST: 49 U/L — ABNORMAL HIGH (ref 0–37)
Albumin: 5 g/dL (ref 3.5–5.2)
Alkaline Phosphatase: 47 U/L (ref 39–117)
BUN: 10 mg/dL (ref 6–23)
CO2: 31 mEq/L (ref 19–32)
Calcium: 10 mg/dL (ref 8.4–10.5)
Chloride: 101 mEq/L (ref 96–112)
Creatinine, Ser: 0.86 mg/dL (ref 0.40–1.50)
GFR: 98.71 mL/min (ref >60.00)
Glucose, Bld: 94 mg/dL (ref 70–99)
Potassium: 4.2 mEq/L (ref 3.5–5.1)
Sodium: 139 mEq/L (ref 135–145)
Total Bilirubin: 0.7 mg/dL (ref 0.2–1.2)
Total Protein: 7.8 g/dL (ref 6.0–8.3)

## 2020-01-15 LAB — LIPID PANEL
Cholesterol: 215 mg/dL — ABNORMAL HIGH (ref 0–200)
HDL: 49.3 mg/dL (ref >39.00)
LDL Cholesterol: 151 mg/dL — ABNORMAL HIGH (ref 0–99)
NonHDL: 165.21
Total CHOL/HDL Ratio: 4
Triglycerides: 71 mg/dL (ref 0.0–149.0)
VLDL: 14.2 mg/dL (ref 0.0–40.0)

## 2020-01-15 LAB — CBC WITH DIFFERENTIAL/PLATELET
Basophils Absolute: 0 10*3/uL (ref 0.0–0.1)
Basophils Relative: 0.7 % (ref 0.0–3.0)
Eosinophils Absolute: 0.2 10*3/uL (ref 0.0–0.7)
Eosinophils Relative: 4.7 % (ref 0.0–5.0)
HCT: 44.4 % (ref 39.0–52.0)
Hemoglobin: 15.4 g/dL (ref 13.0–17.0)
Lymphocytes Relative: 48.6 % — ABNORMAL HIGH (ref 12.0–46.0)
Lymphs Abs: 2.1 10*3/uL (ref 0.7–4.0)
MCHC: 34.8 g/dL (ref 30.0–36.0)
MCV: 85 fl (ref 78.0–100.0)
Monocytes Absolute: 0.4 10*3/uL (ref 0.1–1.0)
Monocytes Relative: 8.4 % (ref 3.0–12.0)
Neutro Abs: 1.6 10*3/uL (ref 1.4–7.7)
Neutrophils Relative %: 37.6 % — ABNORMAL LOW (ref 43.0–77.0)
Platelets: 202 10*3/uL (ref 150.0–400.0)
RBC: 5.22 Mil/uL (ref 4.22–5.81)
RDW: 13 % (ref 11.5–15.5)
WBC: 4.3 10*3/uL (ref 4.0–10.5)

## 2020-01-15 LAB — TSH: TSH: 1.51 u[IU]/mL (ref 0.35–4.50)

## 2020-01-15 LAB — URIC ACID: Uric Acid, Serum: 5.6 mg/dL (ref 4.0–7.8)

## 2020-01-15 MED FILL — AMLODIPINE BESYLATE 10 MG T: 10 | 90 days supply | Qty: 90 | Fill #1

## 2020-01-15 MED FILL — HYDROCHLOROTHIAZIDE 25 MG T: 25 | 90 days supply | Qty: 90 | Fill #1

## 2020-02-08 ENCOUNTER — Other Ambulatory Visit (HOSPITAL_COMMUNITY): Payer: Self-pay | Admitting: Physician Assistant

## 2020-02-08 MED FILL — METHYLPHENIDATE ER 20 MG CA: 20 | 30 days supply | Qty: 30 | Fill #0

## 2020-02-10 ENCOUNTER — Encounter: Payer: Self-pay | Admitting: Internal Medicine

## 2020-02-12 MED FILL — FEBUXOSTAT 80 MG TABS: 80 | 90 days supply | Qty: 90 | Fill #1

## 2020-02-12 MED FILL — PANTOPRAZOLE SOD DR 40 MG T: 40 | 90 days supply | Qty: 90 | Fill #1

## 2020-03-01 ENCOUNTER — Ambulatory Visit: Payer: 59 | Admitting: Adult Health

## 2020-03-01 ENCOUNTER — Telehealth: Payer: Self-pay | Admitting: Adult Health

## 2020-03-01 ENCOUNTER — Encounter: Payer: Self-pay | Admitting: Adult Health

## 2020-03-01 ENCOUNTER — Other Ambulatory Visit: Payer: Self-pay

## 2020-03-01 VITALS — BP 140/90 | HR 75 | Temp 97.0°F

## 2020-03-01 DIAGNOSIS — E669 Obesity, unspecified: Secondary | ICD-10-CM | POA: Insufficient documentation

## 2020-03-01 DIAGNOSIS — G4733 Obstructive sleep apnea (adult) (pediatric): Secondary | ICD-10-CM | POA: Diagnosis not present

## 2020-03-01 NOTE — Progress Notes (Signed)
Reviewed and agree with assessment/plan.   Chesley Mires, MD [facility] Pulmonary/Critical Care 03/01/2020, 1:54 PM Pager:  [phone]

## 2020-03-01 NOTE — Assessment & Plan Note (Signed)
Patient is continue with exercise and healthy weight loss

## 2020-03-01 NOTE — Assessment & Plan Note (Signed)
Moderate obstructive sleep apnea with daytime symptom burden.  Home sleep study showed moderate sleep apnea with AHI at 22/hour with SPO2 low at 83%. Patient does have underlying hypertension and obesity with BMI at 34.  CPAP has been ordered and is currently pending through local [place].  We did discuss CPAP mask.  He would like to try the DreamWear nasal mask.  Agree with CPAP settings on AutoSet 5 to 20 cm H2O. We will have patient return in 2 to 3 months for a follow-up and CPAP download. Patient is encouraged on healthy weight loss. Patient education on obstructive sleep apnea and CPAP.  Plan  Patient Instructions  Begin CPAP At bedtime   We will contact DME to see on delivery time.  Wear all night.  CPAP care  Do not drive if sleepy  Work on healthy weight .  Follow with Dr. Halford Chessman  In 2-3 months and As needed

## 2020-03-01 NOTE — Progress Notes (Signed)
@Patient  ID: Andrew Gray, male    DOB: 21-Mar-1981, 39 y.o.   MRN: [id number]  Chief Complaint  Patient presents with  . Consult    OSA     Referring provider: Binnie Rail, MD  HPI: 39 year old male never smoker seen for sleep consult March 01, 2020 to establish for obstructive sleep apnea Medical history significant for gout, hypertension, hyperlipidemia, ADHD, spondylolisthesis  TEST/EVENTS :  Home sleep study December 27, 2019 moderate obstructive sleep apnea with AHI at 22/hour and SPO2 low at 83%.  03/01/2020 Sleep Consult : OSA  Patient presents for a initial consult for sleep apnea.  Patient was recently seen by his primary care provider with complaints of snoring, restless sleep and daytime hypersomnolence.  He was set up for a home sleep study completed on December 27, 2019 that showed moderate obstructive sleep apnea with AHI at 22/hour.  SPO2 low as at 83%.  Order was sent by his primary care provider for nocturnal CPAP.  This was sent to a local DME company, [facility], but unfortunately has not received his new machine to date.  Patient says he was concerned with sleep apnea several years ago.  Says he had a sleep study but did not have sleep apnea with AHI around 4.  Patient says he is very active exercises on a regular basis but has trouble losing weight.  BMI is 34. Patient says he goes to bed each night around the same time he gets up each day at the same time.  Typically goes to bed around 10:50 PM takes about 30 minutes to go to sleep gets up once a night.  Typically gets out of bed around 6 AM.Patient is a physician. 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  Get sleepy watching TV and in the evenings after lunch. Patient does have history of attention deficit disorder is on methylphenidate. Denies any symptoms of narcolepsy, sleep paralysis or cataplexy.       Allergies  Allergen Reactions  . Sulfa Antibiotics Nausea And Vomiting    Immunization History  Administered Date(s)  Administered  . Influenza,inj,Quad PF,6+ Mos 01/29/2018, 02/10/2020  . Influenza-Unspecified 02/05/2015, 02/04/2017, 01/20/2019  . PFIZER SARS-COV-2 Vaccination 05/15/2019, 06/27/2019, 02/26/2020  . Tdap 05/15/1998, 08/16/2017  . Typhoid Live 05/13/2017    History reviewed. No pertinent past medical history.  Tobacco History: Social History   Tobacco Use  Smoking Status Never Smoker  Smokeless Tobacco Never Used   Counseling given: Not Answered  Social history.  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  He is a never smoker.  He is married with 2 children.  Exercises on most days typically 4-5 times a week. Social occasional alcohol use.  Family history is positive for hypertension, CVA and a paternal grandfather.  Outpatient Medications Prior to Visit  Medication Sig Dispense Refill  . amLODipine (NORVASC) 10 MG tablet TAKE 1 TABLET (10 MG TOTAL) BY MOUTH DAILY. 90 tablet 3  . Azelaic Acid 15 % cream Apply 1 application topically 2 (two) times daily. (Patient taking differently: Apply 1 application topically as needed. ) 30 g 11  . cetirizine (ZYRTEC) 10 MG tablet Take 10 mg by mouth daily.    . Febuxostat 80 MG TABS Take 1 tablet (80 mg total) by mouth daily. 90 tablet 3  . gabapentin (NEURONTIN) 300 MG capsule Take 1 capsule (300 mg total) by mouth at bedtime. (Patient taking differently: Take 300 mg by mouth  as needed. ) 90 capsule 3  . hydrochlorothiazide (HYDRODIURIL) 25 MG tablet TAKE 1 TABLET (25 MG TOTAL) BY MOUTH DAILY. 90 tablet 3  . methylphenidate (METADATE CD) 20 MG CR capsule Take 20 mg by mouth daily.    . pantoprazole (PROTONIX) 40 MG tablet Take 1 tablet (40 mg total) by mouth daily. 90 tablet 3  . tadalafil (CIALIS) 20 MG tablet Take 1 tablet (20 mg total) by mouth daily as needed for erectile dysfunction (please use troches). 30 tablet 11  . traZODone (DESYREL) 50 MG tablet Take 50-100 mg by  mouth at bedtime. (Patient not taking: Reported on 03/01/2020)    . triamcinolone cream (KENALOG) 0.1 % Apply 1 application topically 2 (two) times daily. (Patient not taking: Reported on 03/01/2020) 450 g 11   No facility-administered medications prior to visit.     Review of Systems:   Constitutional:   No  weight loss, night sweats,  Fevers, chills, fatigue, or  lassitude.  HEENT:   No headaches,  Difficulty swallowing,  Tooth/dental problems, or  Sore throat,                No sneezing, itching, ear ache, nasal congestion, post nasal drip,   CV:  No chest pain,  Orthopnea, PND, swelling in lower extremities, anasarca, dizziness, palpitations, syncope.   GI  No abdominal pain, nausea, vomiting, diarrhea, change in bowel habits, loss of appetite, bloody stools. +GERD .   Resp: No shortness of breath with exertion or at rest.  No excess mucus, no productive cough,  No non-productive cough,  No coughing up of blood.  No change in color of mucus.  No wheezing.  No chest wall deformity  Skin: no rash or lesions.  GU: no dysuria, change in color of urine, no urgency or frequency.  No flank pain, no hematuria   MS:  No joint pain or swelling.  No decreased range of motion.  No back pain.    Physical Exam  BP 140/90 (BP Location: Left Arm, Patient Position: Sitting, Cuff Size: Large)   Pulse 75   Temp (!) 97 F (36.1 C) (Temporal)   SpO2 100%   GEN: A/Ox3; pleasant , NAD, well nourished , BMI 34   HEENT:  [place]/AT,  NOSE-clear, THROAT-clear, no lesions, no postnasal drip or exudate noted.  Class II MP airway   NECK:  Supple w/ fair ROM; no JVD; normal carotid impulses w/o bruits; no thyromegaly or nodules palpated; no lymphadenopathy.    RESP  Clear  P & A; w/o, wheezes/ rales/ or rhonchi. no accessory muscle use, no dullness to percussion  CARD:  RRR, no m/r/g, no peripheral edema, pulses intact, no cyanosis or clubbing.    Musco: Warm bil, no deformities or joint swelling  noted.   Neuro: alert, no focal deficits noted.    Skin: Warm, no lesions or rashes    Lab Results:  CBC    Component Value Date/Time   WBC 4.3 01/15/2020 0903   RBC 5.22 01/15/2020 0903   HGB 15.4 01/15/2020 0903   HCT 44.4 01/15/2020 0903   PLT 202.0 01/15/2020 0903   MCV 85.0 01/15/2020 0903   MCH 30.4 08/26/2018 0735   MCHC 34.8 01/15/2020 0903   RDW 13.0 01/15/2020 0903   LYMPHSABS 2.1 01/15/2020 0903   MONOABS 0.4 01/15/2020 0903   EOSABS 0.2 01/15/2020 0903   BASOSABS 0.0 01/15/2020 0903    BMET    Component Value Date/Time   NA 139 01/15/2020  0903   NA 141 08/05/2017 0000   K 4.2 01/15/2020 0903   CL 101 01/15/2020 0903   CO2 31 01/15/2020 0903   GLUCOSE 94 01/15/2020 0903   BUN 10 01/15/2020 0903   BUN 12 08/05/2017 0000   CREATININE 0.86 01/15/2020 0903   CREATININE 0.85 08/26/2018 0735   CALCIUM 10.0 01/15/2020 0903   GFRNONAA 111 08/26/2018 0735   GFRAA 128 08/26/2018 0735    BNP No results found for: BNP  ProBNP No results found for: PROBNP  Imaging: No results found.    No flowsheet data found.  No results found for: NITRICOXIDE      Assessment & Plan:   OSA (obstructive sleep apnea) Moderate obstructive sleep apnea with daytime symptom burden.  Home sleep study showed moderate sleep apnea with AHI at 22/hour with SPO2 low at 83%. Patient does have underlying hypertension and obesity with BMI at 34.  CPAP has been ordered and is currently pending through local [place].  We did discuss CPAP mask.  He would like to try the DreamWear nasal mask.  Agree with CPAP settings on AutoSet 5 to 20 cm H2O. We will have patient return in 2 to 3 months for a follow-up and CPAP download. Patient is encouraged on healthy weight loss. Patient education on obstructive sleep apnea and CPAP.  Plan  Patient Instructions  Begin CPAP At bedtime   We will contact DME to see on delivery time.  Wear all night.  CPAP care  Do not drive  if sleepy  Work on healthy weight .  Follow with Dr. Halford Chessman  In 2-3 months and As needed        Obesity (BMI 30.0-34.9) Patient is continue with exercise and healthy weight loss     Rexene Edison, NP 03/01/2020

## 2020-03-01 NOTE — Telephone Encounter (Signed)
Called [facility] to get update on status of patient's CPAP machine shipping.  Spoke with Kenney Houseman, she states that it was ordered on 01/20/20 and they are 8-12 weeks out with delivery.  She was unable to give me an estimate date that he could receive the CPAP machine as there is a Magazine features editor.  There was no tracking # at the time of my call.

## 2020-03-01 NOTE — Patient Instructions (Addendum)
Begin CPAP At bedtime   We will contact DME to see on delivery time.  Wear all night.  CPAP care  Do not drive if sleepy  Work on healthy weight .  Follow with Dr. Halford Chessman  In 2-3 months and As needed

## 2020-03-23 ENCOUNTER — Other Ambulatory Visit (HOSPITAL_COMMUNITY): Payer: Self-pay | Admitting: Physician Assistant

## 2020-03-23 MED FILL — METHYLPHENIDATE ER 20 MG CA: 20 | 30 days supply | Qty: 30 | Fill #0

## 2020-03-29 DIAGNOSIS — G4733 Obstructive sleep apnea (adult) (pediatric): Secondary | ICD-10-CM | POA: Diagnosis not present

## 2020-03-30 ENCOUNTER — Other Ambulatory Visit: Payer: Self-pay

## 2020-03-30 ENCOUNTER — Other Ambulatory Visit (INDEPENDENT_AMBULATORY_CARE_PROVIDER_SITE_OTHER): Payer: Self-pay

## 2020-03-30 ENCOUNTER — Other Ambulatory Visit (INDEPENDENT_AMBULATORY_CARE_PROVIDER_SITE_OTHER): Payer: 59

## 2020-03-30 DIAGNOSIS — E7849 Other hyperlipidemia: Secondary | ICD-10-CM

## 2020-03-30 DIAGNOSIS — R5383 Other fatigue: Secondary | ICD-10-CM

## 2020-03-30 DIAGNOSIS — I10 Essential (primary) hypertension: Secondary | ICD-10-CM

## 2020-03-30 DIAGNOSIS — R7301 Impaired fasting glucose: Secondary | ICD-10-CM

## 2020-03-30 DIAGNOSIS — E785 Hyperlipidemia, unspecified: Secondary | ICD-10-CM

## 2020-03-30 LAB — CBC WITH DIFFERENTIAL/PLATELET
Basophils Absolute: 0 10*3/uL (ref 0.0–0.1)
Basophils Relative: 0.5 % (ref 0.0–3.0)
Eosinophils Absolute: 0.2 10*3/uL (ref 0.0–0.7)
Eosinophils Relative: 3.7 % (ref 0.0–5.0)
HCT: 41.7 % (ref 39.0–52.0)
Hemoglobin: 14.7 g/dL (ref 13.0–17.0)
Lymphocytes Relative: 45.7 % (ref 12.0–46.0)
Lymphs Abs: 1.9 10*3/uL (ref 0.7–4.0)
MCHC: 35.3 g/dL (ref 30.0–36.0)
MCV: 83.4 fl (ref 78.0–100.0)
Monocytes Absolute: 0.4 10*3/uL (ref 0.1–1.0)
Monocytes Relative: 9.1 % (ref 3.0–12.0)
Neutro Abs: 1.7 10*3/uL (ref 1.4–7.7)
Neutrophils Relative %: 41 % — ABNORMAL LOW (ref 43.0–77.0)
Platelets: 191 10*3/uL (ref 150.0–400.0)
RBC: 5 Mil/uL (ref 4.22–5.81)
RDW: 13.3 % (ref 11.5–15.5)
WBC: 4.1 10*3/uL (ref 4.0–10.5)

## 2020-03-30 LAB — COMPREHENSIVE METABOLIC PANEL
ALT: 88 U/L — ABNORMAL HIGH (ref 0–53)
AST: 50 U/L — ABNORMAL HIGH (ref 0–37)
Albumin: 4.8 g/dL (ref 3.5–5.2)
Alkaline Phosphatase: 45 U/L (ref 39–117)
BUN: 11 mg/dL (ref 6–23)
CO2: 31 mEq/L (ref 19–32)
Calcium: 9.8 mg/dL (ref 8.4–10.5)
Chloride: 99 mEq/L (ref 96–112)
Creatinine, Ser: 0.93 mg/dL (ref 0.40–1.50)
GFR: 103.24 mL/min (ref >60.00)
Glucose, Bld: 87 mg/dL (ref 70–99)
Potassium: 3.7 mEq/L (ref 3.5–5.1)
Sodium: 137 mEq/L (ref 135–145)
Total Bilirubin: 1 mg/dL (ref 0.2–1.2)
Total Protein: 7.7 g/dL (ref 6.0–8.3)

## 2020-03-30 LAB — HEMOGLOBIN A1C: Hgb A1c MFr Bld: 4.4 % — ABNORMAL LOW (ref 4.6–6.5)

## 2020-03-30 LAB — TSH: TSH: 1.32 u[IU]/mL (ref 0.35–4.50)

## 2020-03-30 NOTE — Addendum Note (Signed)
Addended by: Cresenciano Lick on: 03/30/2020 09:20 AM   Modules accepted: Orders

## 2020-03-30 NOTE — Addendum Note (Signed)
Addended by: Cresenciano Lick on: 03/30/2020 09:27 AM   Modules accepted: Orders

## 2020-03-31 LAB — LIPID PANEL WITH LDL/HDL RATIO
Cholesterol, Total: 212 mg/dL — ABNORMAL HIGH (ref 100–199)
HDL: 47 mg/dL (ref >39)
LDL Chol Calc (NIH): 147 mg/dL — ABNORMAL HIGH (ref 0–99)
LDL/HDL Ratio: 3.1 ratio (ref 0.0–3.6)
Triglycerides: 101 mg/dL (ref 0–149)
VLDL Cholesterol Cal: 18 mg/dL (ref 5–40)

## 2020-03-31 LAB — INSULIN, RANDOM: INSULIN: 17.7 u[IU]/mL (ref 2.6–24.9)

## 2020-03-31 LAB — SPECIMEN STATUS REPORT

## 2020-04-04 DIAGNOSIS — G4733 Obstructive sleep apnea (adult) (pediatric): Secondary | ICD-10-CM

## 2020-04-04 NOTE — Telephone Encounter (Signed)
[name] please advise on patient mychart message   Hello,  I having be doing well with initial CPAP 4-20. However I find that when I first start I feel like I am not getting enough air. I feel that I breath more easily at around 6cm H2O. Do you think adjusting the settings to 6-20 would work?  Thanks, Andrew Gray

## 2020-04-04 NOTE — Telephone Encounter (Signed)
Of course that is fine . Glad it is working for you. We will do a download on return and adjust as you need it.  Please send order to adjust CPAP pressure 6 to 20cmH2O.

## 2020-04-08 ENCOUNTER — Other Ambulatory Visit (INDEPENDENT_AMBULATORY_CARE_PROVIDER_SITE_OTHER): Payer: Self-pay | Admitting: Family Medicine

## 2020-04-08 ENCOUNTER — Encounter (INDEPENDENT_AMBULATORY_CARE_PROVIDER_SITE_OTHER): Payer: Self-pay | Admitting: Family Medicine

## 2020-04-08 DIAGNOSIS — F902 Attention-deficit hyperactivity disorder, combined type: Secondary | ICD-10-CM | POA: Diagnosis not present

## 2020-04-08 DIAGNOSIS — Z79899 Other long term (current) drug therapy: Secondary | ICD-10-CM | POA: Diagnosis not present

## 2020-04-08 DIAGNOSIS — R632 Polyphagia: Secondary | ICD-10-CM

## 2020-04-08 DIAGNOSIS — F419 Anxiety disorder, unspecified: Secondary | ICD-10-CM | POA: Diagnosis not present

## 2020-04-08 DIAGNOSIS — K760 Fatty (change of) liver, not elsewhere classified: Secondary | ICD-10-CM

## 2020-04-08 DIAGNOSIS — Z724 Inappropriate diet and eating habits: Secondary | ICD-10-CM

## 2020-04-08 MED ORDER — BUPROPION HCL ER (XL) 150 MG PO TB24: 150.0000 mg | ORAL_TABLET | Freq: Every day | ORAL | 3 refills | Status: DC

## 2020-04-08 MED ORDER — TOPIRAMATE 50 MG PO TABS: 50.0000 mg | ORAL_TABLET | Freq: Two times a day (BID) | ORAL | 2 refills | Status: DC

## 2020-04-08 MED FILL — AMLODIPINE BESYLATE 10 MG T: 10 | 90 days supply | Qty: 90 | Fill #2

## 2020-04-08 MED FILL — TOPIRAMATE 50 MG TABLET: 50 | 30 days supply | Qty: 60 | Fill #0

## 2020-04-08 MED FILL — buPROPion HCL ER (XL) 150 M: 150 | 30 days supply | Qty: 30 | Fill #0

## 2020-04-08 MED FILL — HYDROCHLOROTHIAZIDE 25 MG T: 25 | 90 days supply | Qty: 90 | Fill #2

## 2020-04-08 NOTE — Progress Notes (Unsigned)
Documentation Only  RMR 3240, Body fat% 28.1, starting weight 261.0 pounds with goal of 50 pound loss for joint pain management, along with improvement in fatty liver.  Current habits: Not hungry during the day due to being busy and taking Methylphenidate. Getting plenty of fluids. Admits to overeating dinner and snacking later. Also endorses eating at night if unable to sleep. Exercise: walking and elliptical.   Previously lost weight by calorie restriction < 2000 cal/day.  Dx: visceral obesity (visceral fat rating 14) with HLD, insulin resistance, OSA newly with CPAP, and elevated liver tests, hepatic steatosis ([place], 2426), HTN, metabolic syndrome ADHD, gout, chronic LBP, knee pain.   Diet recommendation will be 500 calorie/day deficit, with protein goal of 1.2-1.5 g/ pound weight/day, fiber > 30 g/day, low simple carbohydrates. Plan to be emailed.  Biometric Data and Labs   Lab Results  Component Value Date   CREATININE 0.93 03/30/2020   BUN 11 03/30/2020   NA 137 03/30/2020   K 3.7 03/30/2020   CL 99 03/30/2020   CO2 31 03/30/2020   Lab Results  Component Value Date   ALT 88 (H) 03/30/2020   AST 50 (H) 03/30/2020   ALKPHOS 45 03/30/2020   BILITOT 1.0 03/30/2020   Lab Results  Component Value Date   HGBA1C 4.4 Repeated and verified X2. (L) 03/30/2020   HGBA1C 4.6 08/26/2018   HGBA1C 4.7 07/12/2015   HGBA1C 4.9 02/22/2015   Lab Results  Component Value Date   INSULIN 17.7 03/30/2020   Lab Results  Component Value Date   TSH 1.32 03/30/2020   Lab Results  Component Value Date   CHOL 212 (H) 03/30/2020   HDL 47 03/30/2020   LDLCALC 147 (H) 03/30/2020   TRIG 101 03/30/2020   CHOLHDL 4 01/15/2020   Lab Results  Component Value Date   WBC 4.1 03/30/2020   HGB 14.7 03/30/2020   HCT 41.7 03/30/2020   MCV 83.4 03/30/2020   PLT 191.0 03/30/2020   Briscoe Deutscher, DO

## 2020-04-12 ENCOUNTER — Telehealth (INDEPENDENT_AMBULATORY_CARE_PROVIDER_SITE_OTHER): Payer: Self-pay

## 2020-04-12 NOTE — Telephone Encounter (Signed)
Hi Dr. Juleen China, I had a phone message from this patient to schedule.  In his message it seemed like he thinks he's already had an official phone visit.  He is wanting a 7am appointment, which you do have an 8am appointment January 13, which means he'll come in at 7am.  He asked me to contact him by teams, which I did and he stated "Already did the body comp scale and metabolic rate thing"  I asked him if this was a good time to call him because I have information to go over with him before we schedule, he has not responded yet.  I saw your note on his account.  I just wanted to check with you to see what you are thinking, schedule two appointments for him just like all new patients?  Thank you :)

## 2020-04-12 NOTE — Telephone Encounter (Signed)
Hey again, I have spoke with Dr. Georgina Snell and he's all scheduled, so you can disregard my previous note.

## 2020-04-26 DIAGNOSIS — F411 Generalized anxiety disorder: Secondary | ICD-10-CM | POA: Diagnosis not present

## 2020-04-28 DIAGNOSIS — G4733 Obstructive sleep apnea (adult) (pediatric): Secondary | ICD-10-CM | POA: Diagnosis not present

## 2020-05-13 ENCOUNTER — Ambulatory Visit: Payer: 59 | Admitting: Adult Health

## 2020-05-13 ENCOUNTER — Other Ambulatory Visit: Payer: Self-pay

## 2020-05-13 ENCOUNTER — Encounter: Payer: Self-pay | Admitting: Adult Health

## 2020-05-13 DIAGNOSIS — G4733 Obstructive sleep apnea (adult) (pediatric): Secondary | ICD-10-CM | POA: Diagnosis not present

## 2020-05-13 DIAGNOSIS — E669 Obesity, unspecified: Secondary | ICD-10-CM | POA: Diagnosis not present

## 2020-05-13 MED FILL — PANTOPRAZOLE SOD DR 40 MG T: 40 | 90 days supply | Qty: 90 | Fill #2

## 2020-05-13 MED FILL — buPROPion HCL ER (XL) 150 M: 150 | 30 days supply | Qty: 30 | Fill #1

## 2020-05-13 MED FILL — FEBUXOSTAT 80 MG TABS: 80 | 90 days supply | Qty: 90 | Fill #2

## 2020-05-13 NOTE — Patient Instructions (Signed)
Continue on CPAP at bedtime keep up the good work  Continue with CPAP daily cleaning.   Do not drive if sleepy  Work on healthy weight .  Follow with Dr. Halford Chessman  In 6 months and As needed

## 2020-05-13 NOTE — Assessment & Plan Note (Signed)
Healthy weight loss discussed 

## 2020-05-13 NOTE — Assessment & Plan Note (Signed)
Excellent control and compliance on CPAP   Plan  Patient Instructions  Continue on CPAP at bedtime keep up the good work  Continue with CPAP daily cleaning.   Do not drive if sleepy  Work on healthy weight .  Follow with Dr. Halford Chessman  In 6 months and As needed

## 2020-05-13 NOTE — Progress Notes (Signed)
Reviewed and agree with assessment/plan.   Chesley Mires, MD [facility] Pulmonary/Critical Care 05/13/2020, 2:57 PM Pager:  [phone]

## 2020-05-13 NOTE — Progress Notes (Signed)
@Patient  ID: Andrew Gray, male    DOB: August 27, 1980, 40 y.o.   MRN: [id number]  Chief Complaint  Patient presents with   Follow-up    Referring provider: Binnie Rail, MD  HPI: 40 year old male never smoker seen for sleep consult March 01, 2020 to establish for obstructive sleep apnea. Medical history significant for gout, hypertension, hyperlipidemia, ADHD, spondylolisthesis   TEST/EVENTS :  Home sleep study December 27, 2019 moderate obstructive sleep apnea with AHI at 22/hour and SPO2 low at 83%.  05/13/2020 Follow up: OSA  Patient returns for a 42-month follow-up.  Patient is followed for obstructive sleep apnea.  Home sleep study in August 2021 showed moderate sleep apnea.  He was started on CPAP.  Patient is doing well on CPAP.  Feels rested with no significant daytime sleepiness.  He says he uses his CPAP each night.  Feels that he benefits from CPAP.  He tried the dream wear nasal mask last office visit and really likes it . Feels it works well.    CPAP download shows excellent compliance with 100% usage.  Daily average use at at 7.5 hours.  Patient is on auto CPAP 6 to 20 cm H2O.  AHI 0.8.  Daily average pressure at 8.4 cm H2O. Discussed download results .   Discussed healthy weight loss.    Allergies  Allergen Reactions   Sulfa Antibiotics Nausea And Vomiting    Immunization History  Administered Date(s) Administered   Influenza,inj,Quad PF,6+ Mos 01/29/2018, 02/10/2020   Influenza-Unspecified 02/05/2015, 02/04/2017, 01/20/2019   PFIZER SARS-COV-2 Vaccination 05/15/2019, 06/27/2019, 02/26/2020   Tdap 05/15/1998, 08/16/2017   Typhoid Live 05/13/2017    No past medical history on file.  Tobacco History: Social History   Tobacco Use  Smoking Status Never Smoker  Smokeless Tobacco Never Used   Counseling given: Not Answered   Outpatient Medications Prior to Visit  Medication Sig Dispense Refill   amLODipine (NORVASC) 10 MG tablet TAKE 1 TABLET (10  MG TOTAL) BY MOUTH DAILY. 90 tablet 3   Azelaic Acid 15 % cream Apply 1 application topically 2 (two) times daily. (Patient taking differently: Apply 1 application topically as needed.) 30 g 11   buPROPion (WELLBUTRIN XL) 150 MG 24 hr tablet Take 1 tablet (150 mg total) by mouth daily. 30 tablet 3   cetirizine (ZYRTEC) 10 MG tablet Take 10 mg by mouth daily.     Febuxostat 80 MG TABS Take 1 tablet (80 mg total) by mouth daily. 90 tablet 3   gabapentin (NEURONTIN) 300 MG capsule Take 1 capsule (300 mg total) by mouth at bedtime. (Patient taking differently: Take 300 mg by mouth as needed.) 90 capsule 3   hydrochlorothiazide (HYDRODIURIL) 25 MG tablet TAKE 1 TABLET (25 MG TOTAL) BY MOUTH DAILY. 90 tablet 3   methylphenidate (METADATE CD) 20 MG CR capsule Take 20 mg by mouth daily.     pantoprazole (PROTONIX) 40 MG tablet Take 1 tablet (40 mg total) by mouth daily. 90 tablet 3   tadalafil (CIALIS) 20 MG tablet Take 1 tablet (20 mg total) by mouth daily as needed for erectile dysfunction (please use troches). 30 tablet 11   topiramate (TOPAMAX) 50 MG tablet Take 1 tablet (50 mg total) by mouth 2 (two) times daily. (Patient taking differently: Take 50 mg by mouth daily. At HS) 60 tablet 2   No facility-administered medications prior to visit.     Review of Systems:   Constitutional:   No  weight loss, night  sweats,  Fevers, chills, fatigue, or  lassitude.  HEENT:   No headaches,  Difficulty swallowing,  Tooth/dental problems, or  Sore throat,                No sneezing, itching, ear ache, nasal congestion, post nasal drip,   CV:  No chest pain,  Orthopnea, PND, swelling in lower extremities, anasarca, dizziness, palpitations, syncope.   GI  No heartburn, indigestion, abdominal pain, nausea, vomiting, diarrhea, change in bowel habits, loss of appetite, bloody stools.   Resp: No shortness of breath with exertion or at rest.  No excess mucus, no productive cough,  No non-productive  cough,  No coughing up of blood.  No change in color of mucus.  No wheezing.  No chest wall deformity  Skin: no rash or lesions.  GU: no dysuria, change in color of urine, no urgency or frequency.  No flank pain, no hematuria   MS:  No joint pain or swelling.  No decreased range of motion.  No back pain.    Physical Exam  BP 130/90 (BP Location: Left Arm, Patient Position: Sitting, Cuff Size: Large)    Pulse 66    Temp 98 F (36.7 C) (Temporal)    Ht 6' (1.829 m)    Wt 266 lb (120.7 kg)    SpO2 99%    BMI 36.08 kg/m   GEN: A/Ox3; pleasant , NAD, well nourished     HEENT:  [place]/AT,     THROAT-clear, no lesions, no postnasal drip or exudate noted. Class II MP airway with a ready  NECK:  Supple w/ fair ROM; no JVD;  no thyromegaly or nodules palpated;   RESP  Clear  P & A; w/o, wheezes/ rales/ or rhonchi. no accessory muscle use, no dullness to percussion  CARD:  RRR, no m/r/g, no peripheral edema,   Musco: Warm bil, no deformities or joint swelling noted.   Neuro: alert, no focal deficits noted.    Skin: Warm, no lesions or rashes    Lab Results:  CBC   BNP No results found for: BNP  ProBNP No results found for: PROBNP  Imaging: No results found.    No flowsheet data found.  No results found for: NITRICOXIDE      Assessment & Plan:   OSA (obstructive sleep apnea) Excellent control and compliance on CPAP   Plan  Patient Instructions  Continue on CPAP at bedtime keep up the good work  Continue with CPAP daily cleaning.   Do not drive if sleepy  Work on healthy weight .  Follow with Dr. Halford Chessman  In 6 months and As needed        Obesity (BMI 30.0-34.9) Healthy weight loss discussed      Rexene Edison, NP 05/13/2020

## 2020-05-18 DIAGNOSIS — F411 Generalized anxiety disorder: Secondary | ICD-10-CM | POA: Diagnosis not present

## 2020-05-19 ENCOUNTER — Encounter (INDEPENDENT_AMBULATORY_CARE_PROVIDER_SITE_OTHER): Payer: Self-pay | Admitting: Family Medicine

## 2020-05-19 ENCOUNTER — Other Ambulatory Visit (INDEPENDENT_AMBULATORY_CARE_PROVIDER_SITE_OTHER): Payer: Self-pay | Admitting: Family Medicine

## 2020-05-19 ENCOUNTER — Other Ambulatory Visit: Payer: Self-pay

## 2020-05-19 ENCOUNTER — Ambulatory Visit (INDEPENDENT_AMBULATORY_CARE_PROVIDER_SITE_OTHER): Payer: 59 | Admitting: Family Medicine

## 2020-05-19 VITALS — BP 144/75 | HR 58 | Temp 97.8°F | Ht 72.0 in | Wt 260.0 lb

## 2020-05-19 DIAGNOSIS — R5383 Other fatigue: Secondary | ICD-10-CM

## 2020-05-19 DIAGNOSIS — Z1331 Encounter for screening for depression: Secondary | ICD-10-CM

## 2020-05-19 DIAGNOSIS — Z724 Inappropriate diet and eating habits: Secondary | ICD-10-CM

## 2020-05-19 DIAGNOSIS — R0602 Shortness of breath: Secondary | ICD-10-CM | POA: Diagnosis not present

## 2020-05-19 DIAGNOSIS — Z9989 Dependence on other enabling machines and devices: Secondary | ICD-10-CM

## 2020-05-19 DIAGNOSIS — G43809 Other migraine, not intractable, without status migrainosus: Secondary | ICD-10-CM

## 2020-05-19 DIAGNOSIS — G4733 Obstructive sleep apnea (adult) (pediatric): Secondary | ICD-10-CM | POA: Diagnosis not present

## 2020-05-19 DIAGNOSIS — F908 Attention-deficit hyperactivity disorder, other type: Secondary | ICD-10-CM

## 2020-05-19 DIAGNOSIS — Z6835 Body mass index (BMI) 35.0-35.9, adult: Secondary | ICD-10-CM

## 2020-05-19 DIAGNOSIS — Z0289 Encounter for other administrative examinations: Secondary | ICD-10-CM

## 2020-05-19 MED ORDER — TROKENDI XR 50 MG PO CP24: 50.0000 mg | ORAL_CAPSULE | Freq: Every day | ORAL | 0 refills | Status: DC

## 2020-05-19 MED ORDER — BUPROPION HCL ER (XL) 150 MG PO TB24: 150.0000 mg | ORAL_TABLET | Freq: Every day | ORAL | 2 refills | Status: DC

## 2020-05-19 MED FILL — TROKENDI XR 50 MG CAPSULE: 50 | 30 days supply | Qty: 30 | Fill #0

## 2020-05-20 MED FILL — METHYLPHENIDATE ER 30 MG CA: 30 | 30 days supply | Qty: 30 | Fill #0

## 2020-05-24 NOTE — Progress Notes (Signed)
Chief Complaint:   OBESITY Andrew Gray (MR# [id number]) is a 40 y.o. male who presents for evaluation and treatment of obesity and related comorbidities. Current BMI is Body mass index is 35.26 kg/m. Andrew Gray has been struggling with his weight for many years and has been unsuccessful in either losing weight, maintaining weight loss, or reaching his healthy weight goal.  Andrew Gray is currently in the action stage of change and ready to dedicate time achieving and maintaining a healthier weight. Andrew Gray is interested in becoming our patient and working on intensive lifestyle modifications including (but not limited to) diet and exercise for weight loss.  Andrew Gray says he feels more control with eating.  He is getting in more protein in the morning and afternoon, and is not eating after dinner.  He went on vacation and had holidays.  He says he is ready to refocus.  Depression Screen Andrew Gray's Food and Mood (modified PHQ-9) score was 6.  Depression screen PHQ 2/9 05/19/2020  Decreased Interest 1  Down, Depressed, Hopeless 1  PHQ - 2 Score 2  Altered sleeping 0  Tired, decreased energy 1  Change in appetite 1  Feeling bad or failure about yourself  1  Trouble concentrating 1  Moving slowly or fidgety/restless 0  Suicidal thoughts 0  PHQ-9 Score 6  Difficult doing work/chores Somewhat difficult   Assessment/Plan:   1. Other fatigue Kannon denies daytime somnolence and denies waking up still tired. Patent has a history of symptoms of OSA and is on a CPAP. Andrew Gray generally gets 6 or 7 hours of sleep per night, and states that he wakes too early. Snoring is not present. Apneic episodes are not present. Epworth Sleepiness Score is 3.  Andrew Gray does feel that his weight is causing his energy to be lower than it should be. Fatigue may be related to obesity, depression or many other causes. Labs will be ordered, and in the meanwhile, Andrew Gray will focus on self care including making healthy food choices, increasing  physical activity and focusing on stress reduction.  - EKG 12-Lead  2. SOB (shortness of breath) on exertion Andrew Gray notes increasing shortness of breath with exercising and seems to be worsening over time with weight gain. He notes getting out of breath sooner with activity than he used to. This has not gotten worse recently. Andrew Gray denies shortness of breath at rest or orthopnea.  Andrew Gray does not feel that he gets out of breath more easily that he used to when he exercises. Andrew Gray's shortness of breath appears to be obesity related and exercise induced. He has agreed to work on weight loss and gradually increase exercise to treat his exercise induced shortness of breath. Will continue to monitor closely.  3. Other migraine without status migrainosus, not intractable With scintillating scotoma.  Will stop Topamax and start Trokendi 50 mg daily, as per below.  - Refill Topiramate ER (TROKENDI XR) 50 MG CP24; Take 50 mg by mouth daily.  Dispense: 30 capsule; Refill: 0  4. OSA on CPAP Pulmonology note reviewed.  Andrew Gray has 100% compliance with his CPAP.  OSA is a cause of systemic hypertension and is associated with an increased incidence of stroke, heart failure, atrial fibrillation, and coronary heart disease. Severe OSA increases all-cause mortality and  cardiovascular mortality.   Goal: Treatment of OSA via CPAP compliance and weight loss. . Plasma ghrelin levels (appetite or "hunger hormone") are significantly higher in OSA patients than in BMI-matched controls, but decrease to levels similar  to those of obese patients without OSA after CPAP treatment.  . Weight loss improves OSA by several mechanisms, including reduction in fatty tissue in the throat (i.e. parapharyngeal fat) and the tongue. Loss of abdominal fat increases mediastinal traction on the upper airway making it less likely to collapse during sleep. . Studies have also shown that compliance with CPAP treatment improves leptin (hunger  inhibitory hormone) imbalance.  5. Inappropriate diet or eating habits, excess snacking, night-time eating Improving. Will refill Wellbutrin XL 150 mg daily, as per below.  - Refill buPROPion (WELLBUTRIN XL) 150 MG 24 hr tablet; Take 1 tablet (150 mg total) by mouth daily.  Dispense: 30 tablet; Refill: 2  6. Attention deficit hyperactivity disorder (ADHD), other type Talor is taking methylphenidate 20 mg daiy, but does not take it on weekends. The current medical regimen is effective;  continue present plan and medications.  7. Depression screen Andrew Gray was screened for depression as part of his new patient workup today.  PHQ-9 is 6.  8. Class 2 severe obesity with serious comorbidity and body mass index (BMI) of 35.0 to 35.9 in adult, unspecified obesity type (HCC)  Andrew Gray is currently in the action stage of change and his goal is to continue with weight loss efforts. I recommend Andrew Gray begin the structured treatment plan as follows:  He has agreed to keeping a food journal and adhering to recommended goals of 2000-2500 calories and 125 grams of protein.  Exercise goals: For substantial health benefits, adults should do at least 150 minutes (2 hours and 30 minutes) a week of moderate-intensity, or 75 minutes (1 hour and 15 minutes) a week of vigorous-intensity aerobic physical activity, or an equivalent combination of moderate- and vigorous-intensity aerobic activity. Aerobic activity should be performed in episodes of at least 10 minutes, and preferably, it should be spread throughout the week.   Behavioral modification strategies: increasing lean protein intake, increasing water intake and keeping a strict food journal.  He was informed of the importance of frequent follow-up visits to maximize his success with intensive lifestyle modifications for his multiple health conditions. He was informed we would discuss his lab results at his next visit unless there is a critical issue that needs to be  addressed sooner. Andrew Gray agreed to keep his next visit at the agreed upon time to discuss these results.  Objective:   Blood pressure (!) 144/75, pulse (!) 58, temperature 97.8 F (36.6 C), temperature source Oral, height 6' (1.829 m), weight 260 lb (117.9 kg), SpO2 97 %. Body mass index is 35.26 kg/m.  EKG: Normal sinus rhythm, rate 68 bpm.  Indirect Calorimeter completed today shows a VO2 of 465 and a REE of 3240.  His calculated basal metabolic rate is 0160 thus his basal metabolic rate is better than expected.  General: Cooperative, alert, well developed, in no acute distress. HEENT: Conjunctivae and lids unremarkable. Cardiovascular: Regular rhythm.  Lungs: Normal work of breathing. Neurologic: No focal deficits.   Lab Results  Component Value Date   CREATININE 0.93 03/30/2020   BUN 11 03/30/2020   NA 137 03/30/2020   K 3.7 03/30/2020   CL 99 03/30/2020   CO2 31 03/30/2020   Lab Results  Component Value Date   ALT 88 (H) 03/30/2020   AST 50 (H) 03/30/2020   ALKPHOS 45 03/30/2020   BILITOT 1.0 03/30/2020   Lab Results  Component Value Date   HGBA1C 4.4 Repeated and verified X2. (L) 03/30/2020   HGBA1C 4.6 08/26/2018  HGBA1C 4.7 07/12/2015   HGBA1C 4.9 02/22/2015   Lab Results  Component Value Date   INSULIN 17.7 03/30/2020   Lab Results  Component Value Date   TSH 1.32 03/30/2020   Lab Results  Component Value Date   CHOL 212 (H) 03/30/2020   HDL 47 03/30/2020   LDLCALC 147 (H) 03/30/2020   TRIG 101 03/30/2020   CHOLHDL 4 01/15/2020   Lab Results  Component Value Date   WBC 4.1 03/30/2020   HGB 14.7 03/30/2020   HCT 41.7 03/30/2020   MCV 83.4 03/30/2020   PLT 191.0 03/30/2020   Attestation Statements:   Reviewed by clinician on day of visit: allergies, medications, problem list, medical history, surgical history, family history, social history, and previous encounter notes.  I, Water quality scientist, CMA, am acting as transcriptionist for Briscoe Deutscher,  DO  I have reviewed the above documentation for accuracy and completeness, and I agree with the above. Briscoe Deutscher, DO

## 2020-05-29 DIAGNOSIS — G4733 Obstructive sleep apnea (adult) (pediatric): Secondary | ICD-10-CM | POA: Diagnosis not present

## 2020-06-01 ENCOUNTER — Ambulatory Visit (INDEPENDENT_AMBULATORY_CARE_PROVIDER_SITE_OTHER): Payer: 59 | Admitting: Family Medicine

## 2020-06-01 ENCOUNTER — Encounter (INDEPENDENT_AMBULATORY_CARE_PROVIDER_SITE_OTHER): Payer: Self-pay | Admitting: Family Medicine

## 2020-06-01 ENCOUNTER — Other Ambulatory Visit: Payer: Self-pay

## 2020-06-01 VITALS — BP 121/73 | HR 73 | Temp 97.8°F | Ht 72.0 in | Wt 257.0 lb

## 2020-06-01 DIAGNOSIS — K219 Gastro-esophageal reflux disease without esophagitis: Secondary | ICD-10-CM

## 2020-06-01 DIAGNOSIS — F411 Generalized anxiety disorder: Secondary | ICD-10-CM | POA: Diagnosis not present

## 2020-06-01 DIAGNOSIS — Z6835 Body mass index (BMI) 35.0-35.9, adult: Secondary | ICD-10-CM | POA: Diagnosis not present

## 2020-06-01 DIAGNOSIS — Z724 Inappropriate diet and eating habits: Secondary | ICD-10-CM

## 2020-06-01 DIAGNOSIS — F908 Attention-deficit hyperactivity disorder, other type: Secondary | ICD-10-CM | POA: Diagnosis not present

## 2020-06-02 NOTE — Progress Notes (Signed)
Chief Complaint:   OBESITY Andrew Gray is here to discuss his progress with his obesity treatment plan along with follow-up of his obesity related diagnoses.   Today's visit was #: 2 Starting weight: 260 lbs Starting date: 05/19/2020 Today's weight: 257 lbs Today's date: 06/01/2020 Total lbs lost to date: 3 lbs Body mass index is 34.86 kg/m.  Total weight loss percentage to date: -1.15%  Interim History: Andrew Gray says he has much more control regarding food choices and night eating.  He has been getting much less exercise since his last visit due to the weather. Nutrition Plan: keeping a food journal and adhering to recommended goals of 2000-2500 calories and 125 grams of protein.  Activity: Walking/cardio for 20-60 minutes 5 times per week.  Assessment/Plan:   1. Gastroesophageal reflux disease, unspecified whether esophagitis present Medication: Protonix. The current medical regimen is effective;  continue present plan and medications.  2. Attention deficit hyperactivity disorder (ADHD), other type Andrew Gray is taking Metadate 20 mg daily. The current medical regimen is effective;  continue present plan and medications.  3. Inappropriate diet or eating habits, night-time eating Improving with topiramate and Wellbutrin. The current medical regimen is effective;  continue present plan and medications.  4. Class 2 severe obesity with serious comorbidity and body mass index (BMI) of 35.0 to 35.9 in adult, unspecified obesity type (Andrew Gray)  Course: Andrew Gray is currently in the action stage of change. As such, his goal is to continue with weight loss efforts.   Nutrition goals: He has agreed to keeping a food journal and adhering to recommended goals of 2000-2500 calories and 125 grams of protein.   Exercise goals: For substantial health benefits, adults should do at least 150 minutes (2 hours and 30 minutes) a week of moderate-intensity, or 75 minutes (1 hour and 15 minutes) a week of  vigorous-intensity aerobic physical activity, or an equivalent combination of moderate- and vigorous-intensity aerobic activity. Aerobic activity should be performed in episodes of at least 10 minutes, and preferably, it should be spread throughout the week.  Behavioral modification strategies: increasing lean protein intake, decreasing simple carbohydrates, increasing vegetables and increasing water intake.  Andrew Gray has agreed to follow-up with our clinic in 2 weeks. He was informed of the importance of frequent follow-up visits to maximize his success with intensive lifestyle modifications for his multiple health conditions.   Objective:   Blood pressure 121/73, pulse 73, temperature 97.8 F (36.6 C), temperature source Oral, height 6' (1.829 m), weight 257 lb (116.6 kg), SpO2 99 %. Body mass index is 34.86 kg/m.  General: Cooperative, alert, well developed, in no acute distress. HEENT: Conjunctivae and lids unremarkable. Cardiovascular: Regular rhythm.  Lungs: Normal work of breathing. Neurologic: No focal deficits.   Lab Results  Component Value Date   CREATININE 0.93 03/30/2020   BUN 11 03/30/2020   NA 137 03/30/2020   K 3.7 03/30/2020   CL 99 03/30/2020   CO2 31 03/30/2020   Lab Results  Component Value Date   ALT 88 (H) 03/30/2020   AST 50 (H) 03/30/2020   ALKPHOS 45 03/30/2020   BILITOT 1.0 03/30/2020   Lab Results  Component Value Date   HGBA1C 4.4 Repeated and verified X2. (L) 03/30/2020   HGBA1C 4.6 08/26/2018   HGBA1C 4.7 07/12/2015   HGBA1C 4.9 02/22/2015   Lab Results  Component Value Date   INSULIN 17.7 03/30/2020   Lab Results  Component Value Date   TSH 1.32 03/30/2020   Lab Results  Component Value Date   CHOL 212 (H) 03/30/2020   HDL 47 03/30/2020   LDLCALC 147 (H) 03/30/2020   TRIG 101 03/30/2020   CHOLHDL 4 01/15/2020   Lab Results  Component Value Date   WBC 4.1 03/30/2020   HGB 14.7 03/30/2020   HCT 41.7 03/30/2020   MCV 83.4  03/30/2020   PLT 191.0 03/30/2020   Attestation Statements:   Reviewed by clinician on day of visit: allergies, medications, problem list, medical history, surgical history, family history, social history, and previous encounter notes.  I, Water quality scientist, CMA, am acting as transcriptionist for Briscoe Deutscher, DO  I have reviewed the above documentation for accuracy and completeness, and I agree with the above. Briscoe Deutscher, DO

## 2020-06-09 MED FILL — METHYLPHENIDATE ER 20 MG CA: 20 | 30 days supply | Qty: 30 | Fill #0

## 2020-06-11 ENCOUNTER — Other Ambulatory Visit (INDEPENDENT_AMBULATORY_CARE_PROVIDER_SITE_OTHER): Payer: Self-pay | Admitting: Family Medicine

## 2020-06-11 DIAGNOSIS — G43809 Other migraine, not intractable, without status migrainosus: Secondary | ICD-10-CM

## 2020-06-11 MED FILL — buPROPion HCL ER (XL) 150 M: 150 | 30 days supply | Qty: 30 | Fill #2

## 2020-06-13 ENCOUNTER — Encounter (INDEPENDENT_AMBULATORY_CARE_PROVIDER_SITE_OTHER): Payer: Self-pay | Admitting: Family Medicine

## 2020-06-13 ENCOUNTER — Other Ambulatory Visit (INDEPENDENT_AMBULATORY_CARE_PROVIDER_SITE_OTHER): Payer: Self-pay | Admitting: Family Medicine

## 2020-06-13 DIAGNOSIS — G43809 Other migraine, not intractable, without status migrainosus: Secondary | ICD-10-CM

## 2020-06-16 ENCOUNTER — Other Ambulatory Visit: Payer: Self-pay

## 2020-06-16 ENCOUNTER — Ambulatory Visit (INDEPENDENT_AMBULATORY_CARE_PROVIDER_SITE_OTHER): Payer: 59 | Admitting: Family Medicine

## 2020-06-16 ENCOUNTER — Other Ambulatory Visit (INDEPENDENT_AMBULATORY_CARE_PROVIDER_SITE_OTHER): Payer: Self-pay | Admitting: Family Medicine

## 2020-06-16 ENCOUNTER — Encounter (INDEPENDENT_AMBULATORY_CARE_PROVIDER_SITE_OTHER): Payer: Self-pay | Admitting: Family Medicine

## 2020-06-16 VITALS — BP 121/78 | HR 68 | Temp 97.6°F | Ht 72.0 in | Wt 255.0 lb

## 2020-06-16 DIAGNOSIS — Z724 Inappropriate diet and eating habits: Secondary | ICD-10-CM | POA: Diagnosis not present

## 2020-06-16 DIAGNOSIS — Z9989 Dependence on other enabling machines and devices: Secondary | ICD-10-CM

## 2020-06-16 DIAGNOSIS — G4733 Obstructive sleep apnea (adult) (pediatric): Secondary | ICD-10-CM | POA: Diagnosis not present

## 2020-06-16 DIAGNOSIS — Z6834 Body mass index (BMI) 34.0-34.9, adult: Secondary | ICD-10-CM | POA: Diagnosis not present

## 2020-06-16 DIAGNOSIS — E669 Obesity, unspecified: Secondary | ICD-10-CM

## 2020-06-16 DIAGNOSIS — G43809 Other migraine, not intractable, without status migrainosus: Secondary | ICD-10-CM | POA: Diagnosis not present

## 2020-06-16 MED ORDER — TROKENDI XR 50 MG PO CP24: 50.0000 mg | ORAL_CAPSULE | Freq: Every day | ORAL | 2 refills | Status: DC

## 2020-06-16 MED ORDER — BUPROPION HCL ER (XL) 150 MG PO TB24: 150.0000 mg | ORAL_TABLET | Freq: Every day | ORAL | 2 refills | Status: DC

## 2020-06-16 MED FILL — TROKENDI XR 50 MG CAPSULE: 50 | 90 days supply | Qty: 90 | Fill #0

## 2020-06-16 NOTE — Progress Notes (Signed)
Chief Complaint:   OBESITY Andrew Gray is here to discuss his progress with his obesity treatment plan along with follow-up of his obesity related diagnoses.   Today's visit was #: 3 Starting weight: 260 lbs Starting date: 05/19/2020 Today's weight: 255 lbs Today's date: 06/16/2020 Total lbs lost to date: 5 lbs Body mass index is 34.58 kg/m.  Total weight loss percentage to date: -1.92%  Interim History: Andrew Gray says he is doing well.  He is happy with his progress.  He says he has been making healthy choices. Nutrition Plan: keeping a food journal and adhering to recommended goals of 2000-2500 calories and 125 grams of protein for 90-95% of the time. Activity: Walking / cardio for 20-60 minutes 5 times per week.  Assessment/Plan:   1. Other migraine without status migrainosus, not intractable Andrew Gray is now taking Trokendi XR 50 mg daily.  No concerns.  Plan:  Continue Trokendi.  Will refill today, as per below.  - Refill Topiramate ER (TROKENDI XR) 50 MG CP24; Take 50 mg by mouth daily.  Dispense: 90 capsule; Refill: 2  2. Inappropriate diet or eating habits, excess snacking, night-time eating Improving with topiramate and Wellbutrin. The current medical regimen is effective;  continue present plan and medications.  Plan:  Will refill Wellbutrin today, as per below.  - Refill buPROPion (WELLBUTRIN XL) 150 MG 24 hr tablet; Take 1 tablet (150 mg total) by mouth daily.  Dispense: 90 tablet; Refill: 2  3. OSA on CPAP OSA is a cause of systemic hypertension and is associated with an increased incidence of stroke, heart failure, atrial fibrillation, and coronary heart disease. Severe OSA increases all-cause mortality and  cardiovascular mortality.   Goal: Treatment of OSA via CPAP compliance and weight loss. . Plasma ghrelin levels (appetite or "hunger hormone") are significantly higher in OSA patients than in BMI-matched controls, but decrease to levels similar to those of obese patients  without OSA after CPAP treatment.  . Weight loss improves OSA by several mechanisms, including reduction in fatty tissue in the throat (i.e. parapharyngeal fat) and the tongue. Loss of abdominal fat increases mediastinal traction on the upper airway making it less likely to collapse during sleep. . Studies have also shown that compliance with CPAP treatment improves leptin (hunger inhibitory hormone) imbalance.  4. Class 1 obesity with serious comorbidity and body mass index (BMI) of 34.0 to 34.9 in adult, unspecified obesity type  Course: Andrew Gray is currently in the action stage of change. As such, his goal is to continue with weight loss efforts.   Nutrition goals: He has agreed to keeping a food journal and adhering to recommended goals of 2000-2500 calories and 125 grams of protein.   Exercise goals: As is.  Behavioral modification strategies: increasing lean protein intake, decreasing simple carbohydrates, increasing vegetables and increasing water intake.  Andrew Gray has agreed to follow-up with our clinic in 4 weeks. He was informed of the importance of frequent follow-up visits to maximize his success with intensive lifestyle modifications for his multiple health conditions.   Objective:   Blood pressure 121/78, pulse 68, temperature 97.6 F (36.4 C), temperature source Oral, height 6' (1.829 m), weight 255 lb (115.7 kg), SpO2 97 %. Body mass index is 34.58 kg/m.  General: Cooperative, alert, well developed, in no acute distress. HEENT: Conjunctivae and lids unremarkable. Cardiovascular: Regular rhythm.  Lungs: Normal work of breathing. Neurologic: No focal deficits.   Lab Results  Component Value Date   CREATININE 0.93 03/30/2020  BUN 11 03/30/2020   NA 137 03/30/2020   K 3.7 03/30/2020   CL 99 03/30/2020   CO2 31 03/30/2020   Lab Results  Component Value Date   ALT 88 (H) 03/30/2020   AST 50 (H) 03/30/2020   ALKPHOS 45 03/30/2020   BILITOT 1.0 03/30/2020   Lab Results   Component Value Date   HGBA1C 4.4 Repeated and verified X2. (L) 03/30/2020   HGBA1C 4.6 08/26/2018   HGBA1C 4.7 07/12/2015   HGBA1C 4.9 02/22/2015   Lab Results  Component Value Date   INSULIN 17.7 03/30/2020   Lab Results  Component Value Date   TSH 1.32 03/30/2020   Lab Results  Component Value Date   CHOL 212 (H) 03/30/2020   HDL 47 03/30/2020   LDLCALC 147 (H) 03/30/2020   TRIG 101 03/30/2020   CHOLHDL 4 01/15/2020   Lab Results  Component Value Date   WBC 4.1 03/30/2020   HGB 14.7 03/30/2020   HCT 41.7 03/30/2020   MCV 83.4 03/30/2020   PLT 191.0 03/30/2020   Attestation Statements:   Reviewed by clinician on day of visit: allergies, medications, problem list, medical history, surgical history, family history, social history, and previous encounter notes.  I, Water quality scientist, CMA, am acting as transcriptionist for Briscoe Deutscher, DO  I have reviewed the above documentation for accuracy and completeness, and I agree with the above. Briscoe Deutscher, DO

## 2020-06-22 DIAGNOSIS — F411 Generalized anxiety disorder: Secondary | ICD-10-CM | POA: Diagnosis not present

## 2020-06-29 DIAGNOSIS — G4733 Obstructive sleep apnea (adult) (pediatric): Secondary | ICD-10-CM | POA: Diagnosis not present

## 2020-07-01 ENCOUNTER — Other Ambulatory Visit (HOSPITAL_COMMUNITY): Payer: Self-pay | Admitting: Physician Assistant

## 2020-07-01 ENCOUNTER — Encounter: Payer: Self-pay | Admitting: Internal Medicine

## 2020-07-01 DIAGNOSIS — F902 Attention-deficit hyperactivity disorder, combined type: Secondary | ICD-10-CM | POA: Diagnosis not present

## 2020-07-01 DIAGNOSIS — G4733 Obstructive sleep apnea (adult) (pediatric): Secondary | ICD-10-CM | POA: Diagnosis not present

## 2020-07-01 DIAGNOSIS — Z79899 Other long term (current) drug therapy: Secondary | ICD-10-CM | POA: Diagnosis not present

## 2020-07-03 MED ORDER — TADALAFIL 20 MG PO TABS: 20.0000 mg | ORAL_TABLET | Freq: Every day | ORAL | 11 refills | Status: DC | PRN

## 2020-07-04 NOTE — Telephone Encounter (Signed)
[facility] called and was wondering if tablets were okay or if it needed to be in troches form. Please advise.    Phone: [phone]

## 2020-07-06 DIAGNOSIS — F411 Generalized anxiety disorder: Secondary | ICD-10-CM | POA: Diagnosis not present

## 2020-07-13 ENCOUNTER — Other Ambulatory Visit: Payer: Self-pay

## 2020-07-13 DIAGNOSIS — R079 Chest pain, unspecified: Secondary | ICD-10-CM

## 2020-07-14 ENCOUNTER — Ambulatory Visit (INDEPENDENT_AMBULATORY_CARE_PROVIDER_SITE_OTHER): Payer: 59

## 2020-07-14 ENCOUNTER — Other Ambulatory Visit: Payer: Self-pay | Admitting: Family Medicine

## 2020-07-14 DIAGNOSIS — R079 Chest pain, unspecified: Secondary | ICD-10-CM

## 2020-07-14 DIAGNOSIS — S2242XA Multiple fractures of ribs, left side, initial encounter for closed fracture: Secondary | ICD-10-CM | POA: Diagnosis not present

## 2020-07-15 ENCOUNTER — Other Ambulatory Visit: Payer: Self-pay | Admitting: Family Medicine

## 2020-07-15 MED ORDER — TIZANIDINE HCL 4 MG PO TABS: 4.0000 mg | ORAL_TABLET | Freq: Three times a day (TID) | ORAL | 0 refills | Status: DC | PRN

## 2020-07-15 MED FILL — tiZANidine HCL 4 MG TABS: 4 | 10 days supply | Qty: 30 | Fill #0

## 2020-07-15 MED FILL — AMLODIPINE BESYLATE 10 MG T: 10 | 90 days supply | Qty: 90 | Fill #3

## 2020-07-15 MED FILL — HYDROCHLOROTHIAZIDE 25 MG T: 25 | 90 days supply | Qty: 90 | Fill #3

## 2020-07-15 NOTE — Progress Notes (Signed)
Seen patient. Had rib injury. Xray show non displaced fracture 5/6 on left  Worsenign pain with muscle spasm.  Patient been doing ibuprofen and tylenol Sent in zanaflex, discuss side effects  Understand worsening pain to call

## 2020-07-19 ENCOUNTER — Encounter: Payer: Self-pay | Admitting: Family Medicine

## 2020-07-19 ENCOUNTER — Ambulatory Visit (INDEPENDENT_AMBULATORY_CARE_PROVIDER_SITE_OTHER): Payer: 59

## 2020-07-19 ENCOUNTER — Other Ambulatory Visit: Payer: Self-pay

## 2020-07-19 DIAGNOSIS — R0781 Pleurodynia: Secondary | ICD-10-CM

## 2020-07-19 DIAGNOSIS — R079 Chest pain, unspecified: Secondary | ICD-10-CM | POA: Diagnosis not present

## 2020-07-19 DIAGNOSIS — M546 Pain in thoracic spine: Secondary | ICD-10-CM | POA: Diagnosis not present

## 2020-07-19 DIAGNOSIS — J90 Pleural effusion, not elsewhere classified: Secondary | ICD-10-CM | POA: Diagnosis not present

## 2020-07-19 NOTE — Progress Notes (Signed)
Patient did have the rib fractures on the left side of the fifth and 6.  Patient has been getting Zanaflex.  Patient does have gabapentin from another provider.  Discussed with patient about the possibility of formal physical therapy which patient will be started on already has a scheduled appointment for Friday.  Orders placed today.  New x-rays of the thoracic and chest given today.  Make sure there is resolution of the effusion.

## 2020-07-20 ENCOUNTER — Other Ambulatory Visit: Payer: Self-pay

## 2020-07-20 ENCOUNTER — Ambulatory Visit (INDEPENDENT_AMBULATORY_CARE_PROVIDER_SITE_OTHER): Payer: 59 | Admitting: Family Medicine

## 2020-07-20 ENCOUNTER — Encounter (INDEPENDENT_AMBULATORY_CARE_PROVIDER_SITE_OTHER): Payer: Self-pay | Admitting: Family Medicine

## 2020-07-20 VITALS — BP 137/78 | HR 61 | Temp 97.4°F | Ht 72.0 in | Wt 255.0 lb

## 2020-07-20 DIAGNOSIS — Z6834 Body mass index (BMI) 34.0-34.9, adult: Secondary | ICD-10-CM | POA: Diagnosis not present

## 2020-07-20 DIAGNOSIS — F3289 Other specified depressive episodes: Secondary | ICD-10-CM | POA: Diagnosis not present

## 2020-07-20 DIAGNOSIS — E669 Obesity, unspecified: Secondary | ICD-10-CM | POA: Diagnosis not present

## 2020-07-20 DIAGNOSIS — E782 Mixed hyperlipidemia: Secondary | ICD-10-CM | POA: Diagnosis not present

## 2020-07-22 ENCOUNTER — Encounter: Payer: Self-pay | Admitting: Rehabilitative and Restorative Service Providers"

## 2020-07-22 ENCOUNTER — Ambulatory Visit: Payer: 59 | Admitting: Rehabilitative and Restorative Service Providers"

## 2020-07-22 ENCOUNTER — Other Ambulatory Visit: Payer: Self-pay | Admitting: Family Medicine

## 2020-07-22 ENCOUNTER — Other Ambulatory Visit: Payer: Self-pay

## 2020-07-22 DIAGNOSIS — R0781 Pleurodynia: Secondary | ICD-10-CM

## 2020-07-22 DIAGNOSIS — M546 Pain in thoracic spine: Secondary | ICD-10-CM

## 2020-07-22 NOTE — Therapy (Addendum)
[name], [name], [address] Phone: [phone]   Fax:  [phone]  Physical Therapy Evaluation  Patient Details  Name: Andrew Gray MRN: [id number] Date of Birth: June 10, 1980 Referring Provider (PT): Dr. Hulan Saas   Encounter Date: 07/22/2020   PT End of Session - 07/22/20 1559    Visit Number 1    Number of Visits 6    Date for PT Re-Evaluation 09/02/20    Progress Note Due on Visit 10    PT Start Time 5462    PT Stop Time 1455    PT Time Calculation (min) 30 min    Activity Tolerance Patient tolerated treatment well    Behavior During Therapy [facility] for tasks assessed/performed           Past Medical History:  Diagnosis Date  . ADHD   . Back pain   . Fatty liver   . GERD (gastroesophageal reflux disease)   . Gout   . Hypertension   . Joint pain   . Lactose intolerance   . OSA (obstructive sleep apnea)   . Seasonal allergies     Past Surgical History:  Procedure Laterality Date  . ELBOW SURGERY Left   . KNEE SURGERY    . VASECTOMY      There were no vitals filed for this visit.    Subjective Assessment - 07/22/20 1422    Subjective Pt. indicated fall while skiing with fracture ribs on Lt side.  Pt. stated pain started a few days later in mid Lt side of thoracic.  Relief of pain c pressure in chair.  Standing prolonged    Limitations Lifting;Standing    Currently in Pain? Yes    Pain Score --   reported mild to moderate   Pain Location Back    Pain Orientation Left;Mid    Pain Descriptors / Indicators Aching;Tightness;Sore;Discomfort    Pain Type Acute pain    Pain Onset 1 to 4 weeks ago    Pain Frequency Intermittent    Aggravating Factors  standing prolonged    Pain Relieving Factors percussive device, med, heat              [facility] PT Assessment - 07/22/20 0001      Assessment   Medical Diagnosis Rib pain, thoracic muscle spasms    Referring Provider (PT) Dr. Hulan Saas     Onset Date/Surgical Date 07/11/20    Hand Dominance Right      Precautions   Precaution Comments Rib fractures      Restrictions   Weight Bearing Restrictions No      Balance Screen   Has the patient fallen in the past 6 months Yes    How many times? 2   ski fall, slipping in woods   Has the patient had a decrease in activity level because of a fear of falling?  No    Is the patient reluctant to leave their home because of a fear of falling?  No      Home Ec[employer] residence      Prior [employer] MD in [place]   Overall Cognitive Status Within Functional Limits for tasks assessed      ROM / Strength   AROM / PROM / Strength AROM;Strength      AROM   AROM Assessment Site Thoracic    Thoracic Flexion 50% to lap c  relief noted    Thoracic Extension <25% c symptoms    Thoracic - Right Rotation WFL c tightness    Thoracic - Left Rotation WFL c tightness      Strength   Strength Assessment Site Shoulder    Right/Left Shoulder Left;Right    Right Shoulder Flexion 5/5    Right Shoulder ABduction 5/5    Right Shoulder Internal Rotation 5/5    Right Shoulder External Rotation 5/5    Left Shoulder Flexion 5/5    Left Shoulder ABduction 5/5    Left Shoulder Internal Rotation 5/5    Left Shoulder External Rotation 5/5      Palpation   Spinal mobility restriction cPA , Lt uPA T4-T11    Palpation comment Tenderness, TrP Lt T6-T10 paraspinals                      Objective measurements completed on examination: See above findings.       [facility] Adult PT Treatment/Exercise - 07/22/20 0001      Exercises   Exercises Other Exercises    Other Exercises  HEP instruction c verbal cues for barrel hug, scap retraction c bilateral UE ER, thoracic extension over chair (pending rib symptoms)      Manual Therapy   Manual therapy comments compression to Lt paraspinals, g3 Lt uPA T6-T9              07/29/20 1439  Trigger Point Dry Needling  Consent Given? Yes  Education Handout Provided Previously provided  Other Dry Needling Lt multifidi T8-T11          PT Education - 07/22/20 1421    Education Details HEP, POC, DN    Person(s) Educated Patient    Methods Explanation;Demonstration;Verbal cues    Comprehension Returned demonstration;Verbalized understanding            PT Short Term Goals - 07/22/20 1421      PT SHORT TERM GOAL #1   Title Patient will demonstrate independent use of home exercise program to maintain progress from in clinic treatments.    Time 3    Period Weeks    Status New    Target Date 08/12/20             PT Long Term Goals - 07/22/20 1559      PT LONG TERM GOAL #1   Title Patient will demonstrate/report pain at worst less than or equal to 2/10 to facilitate minimal limitation in daily activity secondary to pain symptoms.    Time 6    Period Weeks    Status New    Target Date 09/02/20      PT LONG TERM GOAL #2   Title Patient will demonstrate independent use of home exercise program to facilitate ability to maintain/progress functional gains from skilled physical therapy services.    Time 6    Period Weeks    Status New    Target Date 09/02/20      PT LONG TERM GOAL #3   Title Pt. will demonstrate thoracic AROM WFL s symptoms to facilitate usual daily and work activity at [employer].    Time 6    Period Weeks    Status New    Target Date 09/02/20      PT LONG TERM GOAL #4   Title Pt. will demonstrate return to work/workouts at PLOF s restriction.    Time 6    Period Weeks    Status New  Target Date 09/02/20                  Plan - 07/22/20 1600    Clinical Impression Statement Patient is a 40 y.o. male who comes to clinic with complaints of thoracic pain with mobility deficits that impair their ability to perform usual daily and recreational functional activities without increase difficulty/symptoms at this time.   Patient to benefit from skilled PT services to address impairments and limitations to improve to previous level of function without restriction secondary to condition.    Examination-Activity Limitations Sit;Carry;Stand;Lift    Examination-Participation Restrictions Occupation;Community Activity;Other   exercise   Stability/Clinical Decision Making Stable/Uncomplicated    Clinical Decision Making Low    Rehab Potential Good    PT Frequency 1x / week    PT Duration 6 weeks    PT Treatment/Interventions ADLs/Self Care Home Management;Cryotherapy;Electrical Stimulation;Iontophoresis 4mg /ml Dexamethasone;Moist Heat;Traction;Balance training;Therapeutic exercise;Therapeutic activities;Functional mobility training;Stair training;Gait training;DME Instruction;Ultrasound;Neuromuscular re-education;Patient/family education;Passive range of motion;Spinal Manipulations;Joint Manipulations;Dry needling;Taping;Manual techniques    PT Next Visit Plan DN as needed, mobility gains.    PT Home Exercise Plan barrel hug, retraction, thoracic extension    Consulted and Agree with Plan of Care Patient           Patient will benefit from skilled therapeutic intervention in order to improve the following deficits and impairments:     Visit Diagnosis: Pain in thoracic spine     Problem List Patient Active Problem List   Diagnosis Date Noted  . Hepatic steatosis 04/08/2020  . Obesity (BMI 30.0-34.9) 03/01/2020  . OSA (obstructive sleep apnea) 12/30/2019  . Right hip osteoarthritis and labral tearing 01/23/2018  . Left knee pain 12/17/2017  . Rosacea 10/11/2017  . Adult ADHD 10/11/2017  . Hyperlipidemia 02/23/2015  . Essential hypertension, benign 02/23/2015  . Gout 02/16/2015  . Spondylolisthesis at L5-S1 level 02/15/2015   Scot Jun, PT, DPT, OCS, ATC 07/22/20  4:03 PM   Included dry needling section in addendum Scot Jun, PT, DPT, OCS, ATC 07/29/20  2:40 PM       [facility] [address] [place], [name], [address] Phone: [phone]   Fax:  [phone]  Name: Andrew Gray MRN: [id number] Date of Birth: 1980/05/17

## 2020-07-22 NOTE — Progress Notes (Signed)
Placed PT referral again

## 2020-07-25 MED FILL — METHYLPHENIDATE ER 20 MG CA: 20 | 30 days supply | Qty: 30 | Fill #0

## 2020-07-26 NOTE — Progress Notes (Signed)
Chief Complaint:   OBESITY Andrew Gray is here to discuss his progress with his obesity treatment plan along with follow-up of his obesity related diagnoses.   Today's visit was #: 4 Starting weight: 260 lbs Starting date: 05/19/2020 Today's weight: 255 lbs Today's date: 07/20/2020 Total lbs lost to date: 5 lbs Body mass index is 34.58 kg/m.  Total weight loss percentage to date: -1.92%  Interim History:  Andrew Gray says he is happy with his progress.  He went skiing in [place] last week.  He had a fall and broke a few ribs and has been having back spasms.  Current Meal Plan: keeping a food journal and adhering to recommended goals of 2000-2500 calories and 125 grams of protein for 90% of the time.  Current Exercise Plan: Walking for 20-30 minutes 3 times per week.  Assessment/Plan:   1. Mixed hyperlipidemia Course: Not at goal. Lipid-lowering medications: None.   Plan: Dietary changes: Increase soluble fiber, decrease simple carbohydrates, decrease saturated fat. Exercise changes: Moderate to vigorous-intensity aerobic activity 150 minutes per week or as tolerated. We will continue to monitor along with PCP/specialists as it pertains to his weight loss journey.  Lab Results  Component Value Date   CHOL 212 (H) 03/30/2020   HDL 47 03/30/2020   LDLCALC 147 (H) 03/30/2020   TRIG 101 03/30/2020   CHOLHDL 4 01/15/2020   Lab Results  Component Value Date   ALT 88 (H) 03/30/2020   AST 50 (H) 03/30/2020   ALKPHOS 45 03/30/2020   BILITOT 1.0 03/30/2020   The 10-year ASCVD risk score Mikey Bussing DC Jr., et al., 2013) is: 1.8%   Values used to calculate the score:     Age: 40 years     Sex: Male     Is Non-Hispanic African American: No     Diabetic: No     Tobacco smoker: No     Systolic Blood Pressure: 502 mmHg     Is BP treated: Yes     HDL Cholesterol: 47 mg/dL     Total Cholesterol: 212 mg/dL  2. Other depression, with emotional eating Improving, but not optimized. Medication:  Wellbutrin XL 150 mg daily and Trokendi XR 50 mg daily.   Plan:  Continue Wellbutrin and Trokendi. Behavior modification techniques were discussed today to help deal with emotional/non-hunger eating behaviors.  3. Class 1 obesity with serious comorbidity and body mass index (BMI) of 34.0 to 34.9 in adult, unspecified obesity type  Course: Andrew Gray is currently in the action stage of change. As such, his goal is to continue with weight loss efforts.   Nutrition goals: He has agreed to keeping a food journal and adhering to recommended goals of 2000-2500 calories and 125 grams of protein.   Exercise goals: For substantial health benefits, adults should do at least 150 minutes (2 hours and 30 minutes) a week of moderate-intensity, or 75 minutes (1 hour and 15 minutes) a week of vigorous-intensity aerobic physical activity, or an equivalent combination of moderate- and vigorous-intensity aerobic activity. Aerobic activity should be performed in episodes of at least 10 minutes, and preferably, it should be spread throughout the week.  Behavioral modification strategies: increasing lean protein intake, decreasing simple carbohydrates and increasing vegetables.  Andrew Gray has agreed to follow-up with our clinic in 4 weeks. He was informed of the importance of frequent follow-up visits to maximize his success with intensive lifestyle modifications for his multiple health conditions.   Objective:   Blood pressure 137/78, pulse 61,  temperature (!) 97.4 F (36.3 C), temperature source Oral, height 6' (1.829 m), weight 255 lb (115.7 kg), SpO2 98 %. Body mass index is 34.58 kg/m.  General: Cooperative, alert, well developed, in no acute distress. HEENT: Conjunctivae and lids unremarkable. Cardiovascular: Regular rhythm.  Lungs: Normal work of breathing. Neurologic: No focal deficits.   Lab Results  Component Value Date   CREATININE 0.93 03/30/2020   BUN 11 03/30/2020   NA 137 03/30/2020   K 3.7  03/30/2020   CL 99 03/30/2020   CO2 31 03/30/2020   Lab Results  Component Value Date   ALT 88 (H) 03/30/2020   AST 50 (H) 03/30/2020   ALKPHOS 45 03/30/2020   BILITOT 1.0 03/30/2020   Lab Results  Component Value Date   HGBA1C 4.4 Repeated and verified X2. (L) 03/30/2020   HGBA1C 4.6 08/26/2018   HGBA1C 4.7 07/12/2015   HGBA1C 4.9 02/22/2015   Lab Results  Component Value Date   INSULIN 17.7 03/30/2020   Lab Results  Component Value Date   TSH 1.32 03/30/2020   Lab Results  Component Value Date   CHOL 212 (H) 03/30/2020   HDL 47 03/30/2020   LDLCALC 147 (H) 03/30/2020   TRIG 101 03/30/2020   CHOLHDL 4 01/15/2020   Lab Results  Component Value Date   WBC 4.1 03/30/2020   HGB 14.7 03/30/2020   HCT 41.7 03/30/2020   MCV 83.4 03/30/2020   PLT 191.0 03/30/2020   Attestation Statements:   Reviewed by clinician on day of visit: allergies, medications, problem list, medical history, surgical history, family history, social history, and previous encounter notes.  I, Water quality scientist, CMA, am acting as transcriptionist for Briscoe Deutscher, DO  I have reviewed the above documentation for accuracy and completeness, and I agree with the above. Briscoe Deutscher, DO

## 2020-07-29 ENCOUNTER — Other Ambulatory Visit: Payer: Self-pay

## 2020-07-29 ENCOUNTER — Encounter: Payer: Self-pay | Admitting: Rehabilitative and Restorative Service Providers"

## 2020-07-29 ENCOUNTER — Other Ambulatory Visit (HOSPITAL_BASED_OUTPATIENT_CLINIC_OR_DEPARTMENT_OTHER): Payer: Self-pay

## 2020-07-29 ENCOUNTER — Ambulatory Visit: Payer: 59 | Admitting: Rehabilitative and Restorative Service Providers"

## 2020-07-29 DIAGNOSIS — M546 Pain in thoracic spine: Secondary | ICD-10-CM

## 2020-07-29 NOTE — Therapy (Signed)
[facility] [address] [place], [name], [address] Phone: [phone]   Fax:  [phone]  Physical Therapy Treatment  Patient Details  Name: Andrew Gray MRN: [id number] Date of Birth: 1980/12/28 Referring Provider (PT): Dr. Hulan Saas   Encounter Date: 07/29/2020   PT End of Session - 07/29/20 1436    Visit Number 2    Number of Visits 6    Date for PT Re-Evaluation 09/02/20    Progress Note Due on Visit 10    PT Start Time 1423    PT Stop Time 1435    PT Time Calculation (min) 12 min    Activity Tolerance Patient tolerated treatment well    Behavior During Therapy [facility] for tasks assessed/performed           Past Medical History:  Diagnosis Date  . ADHD   . Back pain   . Fatty liver   . GERD (gastroesophageal reflux disease)   . Gout   . Hypertension   . Joint pain   . Lactose intolerance   . OSA (obstructive sleep apnea)   . Seasonal allergies     Past Surgical History:  Procedure Laterality Date  . ELBOW SURGERY Left   . KNEE SURGERY    . VASECTOMY      There were no vitals filed for this visit.   Subjective Assessment - 07/29/20 1435    Subjective Pt. indicated feeling improvement in mobility overall since last visit.  No specific complaints at rest upon arrival.    Limitations Lifting;Standing    Currently in Pain? No/denies    Pain Score 0-No pain    Pain Onset 1 to 4 weeks ago                             [facility] Adult PT Treatment/Exercise - 07/29/20 0001      Exercises   Other Exercises  Verbal cues for continued HEP from last viist c addition of tband rows, high rows, gh ext, scap retraction c bilateral UE for strengthening progression.      Manual Therapy   Manual therapy comments compression to Lt paraspinals thoracic region            Trigger Point Dry Needling - 07/29/20 0001    Consent Given? Yes    Education Handout Provided Previously provided    Other Dry Needling Lt  multifidi T8-T11                PT Education - 07/29/20 1435    Education Details Additional HEP    Person(s) Educated Patient    Methods Explanation    Comprehension Verbalized understanding            PT Short Term Goals - 07/29/20 1435      PT SHORT TERM GOAL #1   Title Patient will demonstrate independent use of home exercise program to maintain progress from in clinic treatments.    Time 3    Period Weeks    Status On-going    Target Date 08/12/20             PT Long Term Goals - 07/22/20 1559      PT LONG TERM GOAL #1   Title Patient will demonstrate/report pain at worst less than or equal to 2/10 to facilitate minimal limitation in daily activity secondary to pain symptoms.    Time 6    Period Weeks  Status New    Target Date 09/02/20      PT LONG TERM GOAL #2   Title Patient will demonstrate independent use of home exercise program to facilitate ability to maintain/progress functional gains from skilled physical therapy services.    Time 6    Period Weeks    Status New    Target Date 09/02/20      PT LONG TERM GOAL #3   Title Pt. will demonstrate thoracic AROM WFL s symptoms to facilitate usual daily and work activity at [employer].    Time 6    Period Weeks    Status New    Target Date 09/02/20      PT LONG TERM GOAL #4   Title Pt. will demonstrate return to work/workouts at PLOF s restriction.    Time 6    Period Weeks    Status New    Target Date 09/02/20                 Plan - 07/29/20 1436    Clinical Impression Statement Positive response noted to this point from evaluation treatment performed.  Continued thoracic mobility restriction noted c myofascial complaints.  Able to include strengthening at this time.    Examination-Activity Limitations Sit;Carry;Stand;Lift    Examination-Participation Restrictions Occupation;Community Activity;Other   exercise   Stability/Clinical Decision Making Stable/Uncomplicated    Rehab Potential  Good    PT Frequency 1x / week    PT Duration 6 weeks    PT Treatment/Interventions ADLs/Self Care Home Management;Cryotherapy;Electrical Stimulation;Iontophoresis 4mg /ml Dexamethasone;Moist Heat;Traction;Balance training;Therapeutic exercise;Therapeutic activities;Functional mobility training;Stair training;Gait training;DME Instruction;Ultrasound;Neuromuscular re-education;Patient/family education;Passive range of motion;Spinal Manipulations;Joint Manipulations;Dry needling;Taping;Manual techniques    PT Next Visit Plan DN as needed, mobility gains.    PT Home Exercise Plan barrel hug, retraction, thoracic extension, tband rows, gh ext, bilateral UE c scap retraction    Consulted and Agree with Plan of Care Patient           Patient will benefit from skilled therapeutic intervention in order to improve the following deficits and impairments:     Visit Diagnosis: Pain in thoracic spine     Problem List Patient Active Problem List   Diagnosis Date Noted  . Hepatic steatosis 04/08/2020  . Obesity (BMI 30.0-34.9) 03/01/2020  . OSA (obstructive sleep apnea) 12/30/2019  . Right hip osteoarthritis and labral tearing 01/23/2018  . Left knee pain 12/17/2017  . Rosacea 10/11/2017  . Adult ADHD 10/11/2017  . Hyperlipidemia 02/23/2015  . Essential hypertension, benign 02/23/2015  . Gout 02/16/2015  . Spondylolisthesis at L5-S1 level 02/15/2015    Scot Jun, PT, DPT, OCS, ATC 07/29/20  2:38 PM     [facility] [address] [place], [name], [address] Phone: [phone]   Fax:  [phone]  Name: Andrew Gray MRN: [id number] Date of Birth: May 15, 1980

## 2020-08-03 DIAGNOSIS — F411 Generalized anxiety disorder: Secondary | ICD-10-CM | POA: Diagnosis not present

## 2020-08-07 ENCOUNTER — Other Ambulatory Visit: Payer: Self-pay | Admitting: Family Medicine

## 2020-08-07 MED ORDER — TIZANIDINE HCL 4 MG PO TABS
ORAL_TABLET | Freq: Three times a day (TID) | ORAL | 0 refills | Status: DC | PRN
  Filled 2020-08-07: qty 30, 10d supply, fill #0

## 2020-08-07 MED ORDER — GABAPENTIN 300 MG PO CAPS
300.0000 mg | ORAL_CAPSULE | Freq: Every day | ORAL | 3 refills | Status: DC
  Filled 2020-08-07: qty 90, 90d supply, fill #0

## 2020-08-07 NOTE — Progress Notes (Signed)
Sent in refills for patient, received call, improving rib pain but still pain at night  Continue the meds

## 2020-08-08 ENCOUNTER — Other Ambulatory Visit (HOSPITAL_COMMUNITY): Payer: Self-pay

## 2020-08-11 ENCOUNTER — Other Ambulatory Visit (HOSPITAL_COMMUNITY): Payer: Self-pay

## 2020-08-12 ENCOUNTER — Ambulatory Visit: Payer: 59 | Admitting: Rehabilitative and Restorative Service Providers"

## 2020-08-12 ENCOUNTER — Other Ambulatory Visit: Payer: Self-pay

## 2020-08-12 ENCOUNTER — Encounter: Payer: Self-pay | Admitting: Rehabilitative and Restorative Service Providers"

## 2020-08-12 DIAGNOSIS — M546 Pain in thoracic spine: Secondary | ICD-10-CM

## 2020-08-12 NOTE — Therapy (Addendum)
[facility] [address] [place], [name], [address] Phone: [phone]   Fax:  [phone]  Physical Therapy Treatment/Discharge  Patient Details  Name: NIKOLA MARONE MRN: [id number] Date of Birth: May 21, 1980 Referring Provider (PT): Dr. Hulan Saas   Encounter Date: 08/12/2020   PT End of Session - 08/12/20 1425    Visit Number 3    Number of Visits 6    Date for PT Re-Evaluation 09/02/20    Progress Note Due on Visit 10    PT Start Time 1425    PT Stop Time 1440    PT Time Calculation (min) 15 min    Activity Tolerance Patient tolerated treatment well    Behavior During Therapy [facility] for tasks assessed/performed           Past Medical History:  Diagnosis Date  . ADHD   . Back pain   . Fatty liver   . GERD (gastroesophageal reflux disease)   . Gout   . Hypertension   . Joint pain   . Lactose intolerance   . OSA (obstructive sleep apnea)   . Seasonal allergies     Past Surgical History:  Procedure Laterality Date  . ELBOW SURGERY Left   . KNEE SURGERY    . VASECTOMY      There were no vitals filed for this visit.   Subjective Assessment - 08/12/20 1428    Subjective Pt. stated 4/10 at worst, occasional.  Extended standing still noted at times.    Limitations Lifting;Standing    Currently in Pain? No/denies    Pain Score 0-No pain   4/10 at worst   Pain Location Back    Pain Type Acute pain    Pain Onset 1 to 4 weeks ago    Pain Frequency Occasional    Aggravating Factors  standing prolonged              [facility] PT Assessment - 08/12/20 0001      Assessment   Medical Diagnosis Rib pain, thoracic muscle spasms    Referring Provider (PT) Dr. Hulan Saas    Onset Date/Surgical Date 07/11/20    Hand Dominance Right      AROM   Thoracic Flexion WFL no complaint    Thoracic Extension 75% no complaints    Thoracic - Right Rotation [facility] c no complaints    Thoracic - Left Rotation WFL c mild stiffness                          [facility] Adult PT Treatment/Exercise - 08/12/20 0001      Manual Therapy   Manual therapy comments compression to Lt paraspinals thoracic region, g3 cPA T5-T9 g3            Trigger Point Dry Needling - 08/12/20 0001    Consent Given? Yes    Education Handout Provided Previously provided    Other Dry Needling Lt multifidi T8-T11                  PT Short Term Goals - 08/12/20 1441      PT SHORT TERM GOAL #1   Title Patient will demonstrate independent use of home exercise program to maintain progress from in clinic treatments.    Time 3    Period Weeks    Status Achieved    Target Date 08/12/20             PT Long Term  Goals - 08/12/20 1441      PT LONG TERM GOAL #1   Title Patient will demonstrate/report pain at worst less than or equal to 2/10 to facilitate minimal limitation in daily activity secondary to pain symptoms.    Time 6    Period Weeks    Status Partially Met      PT LONG TERM GOAL #2   Title Patient will demonstrate independent use of home exercise program to facilitate ability to maintain/progress functional gains from skilled physical therapy services.    Time 6    Period Weeks    Status Achieved      PT LONG TERM GOAL #3   Title Pt. will demonstrate thoracic AROM WFL s symptoms to facilitate usual daily and work activity at [employer].    Time 6    Period Weeks    Status Partially Met      PT LONG TERM GOAL #4   Title Pt. will demonstrate return to work/workouts at PLOF s restriction.    Time 6    Period Weeks    Status Partially Met                 Plan - 08/12/20 1442    Clinical Impression Statement Pt. has attended 3 visits overall c noted improvement in symptoms and reduced frequency.  Gains noted in thoracic mobility as observed in data collection.  Recommend for trial HEP c PT on hold at this time due to improvement.    Examination-Activity Limitations Sit;Carry;Stand;Lift     Examination-Participation Restrictions Occupation;Community Activity;Other   exercise   Stability/Clinical Decision Making Stable/Uncomplicated    Rehab Potential Good    PT Frequency 1x / week    PT Duration 6 weeks    PT Treatment/Interventions ADLs/Self Care Home Management;Cryotherapy;Electrical Stimulation;Iontophoresis 4mg /ml Dexamethasone;Moist Heat;Traction;Balance training;Therapeutic exercise;Therapeutic activities;Functional mobility training;Stair training;Gait training;DME Instruction;Ultrasound;Neuromuscular re-education;Patient/family education;Passive range of motion;Spinal Manipulations;Joint Manipulations;Dry needling;Taping;Manual techniques    PT Next Visit Plan PT on hold due to improvement    PT Home Exercise Plan barrel hug, retraction, thoracic extension, tband rows, gh ext, bilateral UE c scap retraction    Consulted and Agree with Plan of Care Patient           Patient will benefit from skilled therapeutic intervention in order to improve the following deficits and impairments:     Visit Diagnosis: Pain in thoracic spine     Problem List Patient Active Problem List   Diagnosis Date Noted  . Hepatic steatosis 04/08/2020  . Obesity (BMI 30.0-34.9) 03/01/2020  . OSA (obstructive sleep apnea) 12/30/2019  . Right hip osteoarthritis and labral tearing 01/23/2018  . Left knee pain 12/17/2017  . Rosacea 10/11/2017  . Adult ADHD 10/11/2017  . Hyperlipidemia 02/23/2015  . Essential hypertension, benign 02/23/2015  . Gout 02/16/2015  . Spondylolisthesis at L5-S1 level 02/15/2015    Scot Jun, PT, DPT, OCS, ATC 08/12/20  2:43 PM   PHYSICAL THERAPY DISCHARGE SUMMARY  Visits from Start of Care: 3  Current functional level related to goals / functional outcomes: See note   Remaining deficits: See note   Education / Equipment: HEP  Plan: Patient agrees to discharge.  Patient goals were met. Patient is being discharged due to being pleased with the  current functional level.  ?????    Scot Jun, PT, DPT, OCS, ATC 10/05/20  9:02 AM       [facility] [address] [place], [name], [address] Phone: [phone]  Fax:  [phone]  Name: ROMIR KLIMOWICZ MRN: [id number] Date of Birth: 1980-12-16

## 2020-08-13 ENCOUNTER — Other Ambulatory Visit (HOSPITAL_COMMUNITY): Payer: Self-pay

## 2020-08-13 MED FILL — Pantoprazole Sodium EC Tab 40 MG (Base Equiv): ORAL | 90 days supply | Qty: 90 | Fill #0 | Status: AC

## 2020-08-13 MED FILL — Febuxostat Tab 80 MG: ORAL | 90 days supply | Qty: 90 | Fill #0 | Status: CN

## 2020-08-17 ENCOUNTER — Other Ambulatory Visit (HOSPITAL_COMMUNITY): Payer: Self-pay

## 2020-08-18 ENCOUNTER — Other Ambulatory Visit (HOSPITAL_COMMUNITY): Payer: Self-pay

## 2020-08-19 ENCOUNTER — Other Ambulatory Visit (HOSPITAL_COMMUNITY): Payer: Self-pay

## 2020-08-25 ENCOUNTER — Other Ambulatory Visit (HOSPITAL_COMMUNITY): Payer: Self-pay

## 2020-08-25 ENCOUNTER — Encounter (INDEPENDENT_AMBULATORY_CARE_PROVIDER_SITE_OTHER): Payer: Self-pay | Admitting: Family Medicine

## 2020-08-25 ENCOUNTER — Ambulatory Visit (INDEPENDENT_AMBULATORY_CARE_PROVIDER_SITE_OTHER): Payer: 59 | Admitting: Family Medicine

## 2020-08-25 ENCOUNTER — Other Ambulatory Visit: Payer: Self-pay

## 2020-08-25 VITALS — BP 129/72 | HR 62 | Temp 98.0°F | Ht 72.0 in | Wt 252.0 lb

## 2020-08-25 DIAGNOSIS — Z724 Inappropriate diet and eating habits: Secondary | ICD-10-CM | POA: Diagnosis not present

## 2020-08-25 DIAGNOSIS — Z6835 Body mass index (BMI) 35.0-35.9, adult: Secondary | ICD-10-CM | POA: Diagnosis not present

## 2020-08-25 DIAGNOSIS — K760 Fatty (change of) liver, not elsewhere classified: Secondary | ICD-10-CM | POA: Diagnosis not present

## 2020-08-25 DIAGNOSIS — M1A0790 Idiopathic chronic gout, unspecified ankle and foot, without tophus (tophi): Secondary | ICD-10-CM | POA: Diagnosis not present

## 2020-08-25 DIAGNOSIS — E782 Mixed hyperlipidemia: Secondary | ICD-10-CM

## 2020-08-25 DIAGNOSIS — R5383 Other fatigue: Secondary | ICD-10-CM

## 2020-08-25 DIAGNOSIS — G43809 Other migraine, not intractable, without status migrainosus: Secondary | ICD-10-CM | POA: Diagnosis not present

## 2020-08-25 MED ORDER — BUPROPION HCL ER (XL) 150 MG PO TB24
ORAL_TABLET | Freq: Every day | ORAL | 2 refills | Status: DC
  Filled 2020-08-25: qty 90, 90d supply, fill #0
  Filled 2020-11-25: qty 90, 90d supply, fill #1
  Filled 2021-03-26: qty 90, 90d supply, fill #2

## 2020-08-25 MED ORDER — TOPIRAMATE ER 50 MG PO CAP24
1.0000 | ORAL_CAPSULE | Freq: Every day | ORAL | 2 refills | Status: DC
  Filled 2020-08-25: qty 90, 90d supply, fill #0
  Filled 2020-12-09: qty 90, 90d supply, fill #1
  Filled 2021-03-12: qty 90, 90d supply, fill #2

## 2020-08-26 LAB — LIPID PANEL
Chol/HDL Ratio: 4.6 ratio (ref 0.0–5.0)
Cholesterol, Total: 199 mg/dL (ref 100–199)
HDL: 43 mg/dL (ref >39)
LDL Chol Calc (NIH): 141 mg/dL — ABNORMAL HIGH (ref 0–99)
Triglycerides: 82 mg/dL (ref 0–149)
VLDL Cholesterol Cal: 15 mg/dL (ref 5–40)

## 2020-08-26 LAB — COMPREHENSIVE METABOLIC PANEL
ALT: 48 IU/L — ABNORMAL HIGH (ref 0–44)
AST: 35 IU/L (ref 0–40)
Albumin/Globulin Ratio: 2 (ref 1.2–2.2)
Albumin: 5.1 g/dL — ABNORMAL HIGH (ref 4.0–5.0)
Alkaline Phosphatase: 98 IU/L (ref 44–121)
BUN/Creatinine Ratio: 20 (ref 9–20)
BUN: 21 mg/dL (ref 6–24)
Bilirubin Total: 0.7 mg/dL (ref 0.0–1.2)
CO2: 22 mmol/L (ref 20–29)
Calcium: 9.7 mg/dL (ref 8.7–10.2)
Chloride: 103 mmol/L (ref 96–106)
Creatinine, Ser: 1.03 mg/dL (ref 0.76–1.27)
Globulin, Total: 2.5 g/dL (ref 1.5–4.5)
Glucose: 96 mg/dL (ref 65–99)
Potassium: 3.9 mmol/L (ref 3.5–5.2)
Sodium: 143 mmol/L (ref 134–144)
Total Protein: 7.6 g/dL (ref 6.0–8.5)
eGFR: 94 mL/min/{1.73_m2} (ref >59)

## 2020-08-26 LAB — URIC ACID: Uric Acid: 7 mg/dL (ref 3.8–8.4)

## 2020-08-29 NOTE — Progress Notes (Signed)
Chief Complaint:   OBESITY Andrew Gray is here to discuss his progress with his obesity treatment plan along with follow-up of his obesity related diagnoses.   Today's visit was #: 5 Starting weight: 260 lbs Starting date: 05/19/2020 Today's weight: 252 lbs Today's date: 08/25/2020 Total lbs lost to date: 8 lbs Body mass index is 34.18 kg/m.  Total weight loss percentage to date: -3.08%  Interim History:  Andrew Gray says his ribs are better.  He is due for labs today. Current Meal Plan: keeping a food journal and adhering to recommended goals of 2000-2500 calories and 125 grams of protein 80-90% of the time.  Current Exercise Plan: Walking for 30 minutes 3-4 times per day.  Assessment/Plan:   1. Mixed hyperlipidemia Course: Not at goal. Lipid-lowering medications: None.   Plan: Dietary changes: Increase soluble fiber, decrease simple carbohydrates, decrease saturated fat. Exercise changes: Moderate to vigorous-intensity aerobic activity 150 minutes per week or as tolerated. We will continue to monitor along with PCP/specialists as it pertains to his weight loss journey.  Will check labs today.  Lab Results  Component Value Date   CHOL 199 08/25/2020   HDL 43 08/25/2020   LDLCALC 141 (H) 08/25/2020   TRIG 82 08/25/2020   CHOLHDL 4.6 08/25/2020   Lab Results  Component Value Date   ALT 48 (H) 08/25/2020   AST 35 08/25/2020   ALKPHOS 98 08/25/2020   BILITOT 0.7 08/25/2020   The 10-year ASCVD risk score Andrew Bussing DC Jr., Andrew al., Andrew Gray) is: 1.6%   Values used to calculate the score:     Age: 40 years     Sex: Male     Is Non-Hispanic African American: No     Diabetic: No     Tobacco smoker: No     Systolic Blood Pressure: 098 mmHg     Is BP treated: Yes     HDL Cholesterol: 43 mg/dL     Total Cholesterol: 199 mg/dL  3. Hepatic steatosis Lab results reviewed with patient. Counseling: Intensive lifestyle modifications are the first line treatment for this issue. We discussed  several lifestyle modifications today and he will continue to work on diet, exercise and weight loss efforts. Orders and follow up as documented in patient record.  4. Excess snacking, night-time eating Improving with Andrew Gray.The current medical regimen is effective; continue present plan and medications.  Plan:  Will refill Andrew Gray today, as per below.  - Refill buPROPion (Andrew Gray XL) 150 MG 24 hr tablet; TAKE 1 TABLET BY MOUTH ONCE A DAY  Dispense: 90 tablet; Refill: 2 - Topiramate ER (TROKENDI XR) 50 MG CP24; TAKE 1 CAPSULE BY MOUTH ONCE A DAY  Dispense: 90 capsule; Refill: 2  6. Obesity, current BMI 34.2  Course: Andrew Gray is currently in the action stage of change. As such, his goal is to continue with weight loss efforts.   Nutrition goals: He has agreed to keeping a food journal and adhering to recommended goals of 2000-2500 calories and 125 grams of protein.   Exercise goals: For substantial health benefits, adults should do at least 150 minutes (2 hours and 30 minutes) a week of moderate-intensity, or 75 minutes (1 hour and 15 minutes) a week of vigorous-intensity aerobic physical activity, or an equivalent combination of moderate- and vigorous-intensity aerobic activity. Aerobic activity should be performed in episodes of at least 10 minutes, and preferably, it should be spread throughout the week.  Behavioral modification strategies: increasing lean protein intake, decreasing simple carbohydrates, increasing  vegetables and increasing water intake.  Andrew Gray has agreed to follow-up with our clinic in 4 weeks. He was informed of the importance of frequent follow-up visits to maximize his success with intensive lifestyle modifications for his multiple health conditions.   Meds ordered this encounter  Medications  . buPROPion (Andrew Gray XL) 150 MG 24 hr tablet    Sig: TAKE 1 TABLET BY MOUTH ONCE A DAY    Dispense:  90 tablet    Refill:  2  . Topiramate ER (TROKENDI XR) 50 MG  CP24    Sig: TAKE 1 CAPSULE BY MOUTH ONCE A DAY    Dispense:  90 capsule    Refill:  2   Orders Placed This Encounter  Procedures  . Comprehensive metabolic panel  . Lipid panel  . Uric acid   Objective:   Blood pressure 129/72, pulse 62, temperature 98 F (36.7 C), temperature source Oral, height 6' (1.829 m), weight 252 lb (114.3 kg), SpO2 98 %. Body mass index is 34.18 kg/m.  General: Cooperative, alert, well developed, in no acute distress. HEENT: Conjunctivae and lids unremarkable. Cardiovascular: Regular rhythm.  Lungs: Normal work of breathing. Neurologic: No focal deficits.   Lab Results  Component Value Date   CREATININE 1.03 08/25/2020   BUN 21 08/25/2020   NA 143 08/25/2020   K 3.9 08/25/2020   CL 103 08/25/2020   CO2 22 08/25/2020   Lab Results  Component Value Date   ALT 48 (H) 08/25/2020   AST 35 08/25/2020   ALKPHOS 98 08/25/2020   BILITOT 0.7 08/25/2020   Lab Results  Component Value Date   HGBA1C 4.4 Repeated and verified X2. (L) 03/30/2020   HGBA1C 4.6 08/26/2018   HGBA1C 4.7 07/12/2015   HGBA1C 4.9 02/22/2015   Lab Results  Component Value Date   INSULIN 17.7 03/30/2020   Lab Results  Component Value Date   TSH 1.32 03/30/2020   Lab Results  Component Value Date   CHOL 199 08/25/2020   HDL 43 08/25/2020   LDLCALC 141 (H) 08/25/2020   TRIG 82 08/25/2020   CHOLHDL 4.6 08/25/2020   Lab Results  Component Value Date   WBC 4.1 03/30/2020   HGB 14.7 03/30/2020   HCT 41.7 03/30/2020   MCV 83.4 03/30/2020   PLT 191.0 03/30/2020   Attestation Statements:   Reviewed by clinician on day of visit: allergies, medications, problem list, medical history, surgical history, family history, social history, and previous encounter notes.  I, Water quality scientist, CMA, am acting as transcriptionist for Briscoe Deutscher, DO  I have reviewed the above documentation for accuracy and completeness, and I agree with the above. Briscoe Deutscher, DO

## 2020-08-30 ENCOUNTER — Other Ambulatory Visit (HOSPITAL_COMMUNITY): Payer: Self-pay

## 2020-08-31 DIAGNOSIS — F411 Generalized anxiety disorder: Secondary | ICD-10-CM | POA: Diagnosis not present

## 2020-09-07 ENCOUNTER — Other Ambulatory Visit (HOSPITAL_COMMUNITY): Payer: Self-pay

## 2020-09-07 MED FILL — Methylphenidate HCl Cap ER 20 MG (CD): ORAL | 30 days supply | Qty: 30 | Fill #0 | Status: AC

## 2020-09-21 DIAGNOSIS — F411 Generalized anxiety disorder: Secondary | ICD-10-CM | POA: Diagnosis not present

## 2020-09-26 DIAGNOSIS — G4733 Obstructive sleep apnea (adult) (pediatric): Secondary | ICD-10-CM | POA: Diagnosis not present

## 2020-09-27 DIAGNOSIS — G4733 Obstructive sleep apnea (adult) (pediatric): Secondary | ICD-10-CM | POA: Diagnosis not present

## 2020-10-06 ENCOUNTER — Other Ambulatory Visit (HOSPITAL_COMMUNITY): Payer: Self-pay

## 2020-10-06 ENCOUNTER — Other Ambulatory Visit: Payer: Self-pay

## 2020-10-06 ENCOUNTER — Ambulatory Visit (INDEPENDENT_AMBULATORY_CARE_PROVIDER_SITE_OTHER): Payer: 59 | Admitting: Family Medicine

## 2020-10-06 ENCOUNTER — Encounter (INDEPENDENT_AMBULATORY_CARE_PROVIDER_SITE_OTHER): Payer: Self-pay | Admitting: Family Medicine

## 2020-10-06 VITALS — BP 126/72 | HR 60 | Temp 98.0°F | Ht 72.0 in | Wt 257.0 lb

## 2020-10-06 DIAGNOSIS — R7301 Impaired fasting glucose: Secondary | ICD-10-CM

## 2020-10-06 DIAGNOSIS — F908 Attention-deficit hyperactivity disorder, other type: Secondary | ICD-10-CM | POA: Diagnosis not present

## 2020-10-06 DIAGNOSIS — K760 Fatty (change of) liver, not elsewhere classified: Secondary | ICD-10-CM

## 2020-10-06 DIAGNOSIS — E7800 Pure hypercholesterolemia, unspecified: Secondary | ICD-10-CM | POA: Diagnosis not present

## 2020-10-06 DIAGNOSIS — Z6835 Body mass index (BMI) 35.0-35.9, adult: Secondary | ICD-10-CM

## 2020-10-06 DIAGNOSIS — G478 Other sleep disorders: Secondary | ICD-10-CM | POA: Diagnosis not present

## 2020-10-06 DIAGNOSIS — Z9189 Other specified personal risk factors, not elsewhere classified: Secondary | ICD-10-CM

## 2020-10-06 MED ORDER — OZEMPIC (0.25 OR 0.5 MG/DOSE) 2 MG/1.5ML ~~LOC~~ SOPN
PEN_INJECTOR | SUBCUTANEOUS | 0 refills | Status: DC
  Filled 2020-10-06: qty 1.5, 30d supply, fill #0

## 2020-10-06 NOTE — Progress Notes (Signed)
Chief Complaint:   Andrew Gray is here to discuss his progress with his Andrew treatment plan along with follow-up of his Andrew related diagnoses. See Medical Weight Management Flowsheet for bioelectrical impedance results.  Today's visit was #: 6 Starting weight: 260 lbs Starting date: 05/19/2020 Today's weight: 257 lbs Today's date: 10/06/2020 Weight change since last visit: +5 lbs Total lbs lost to date: 3 lbs Body mass index is 34.86 kg/m.  Total weight loss percentage to date: -1.15%  Interim History: Andrew Gray says he is now eating a protein shake and bar for lunch.  He reports that he has been eating more at night.  Labs from last visit show improvement.  Nutrition Plan: keeping a food journal and adhering to recommended goals of 2000-2500 calories and 125 grams of protein for 70% of the time. Activity: Walking/cardio/hiking for 30-60 minutes 4-5 times per week.  Assessment/Plan:   1. Impaired fasting glucose Will start Ozempic 0.25 mg subcutaneously weekly.  He will increase to 0.5 mg subcutaneously weekly after 2 weeks.  - Start Semaglutide,0.25 or 0.5MG /DOS, (OZEMPIC, 0.25 OR 0.5 MG/DOSE,) 2 MG/1.5ML SOPN; Inject 0.25 mg into the skin once a week for 14 days, THEN 0.5 mg once a week.  Dispense: 1.5 mL; Refill: 0  2. Attention deficit hyperactivity disorder (ADHD), other type Andrew Gray is taking methylphenidate 30 mg daily for ADHD. Plan:  The current medical regimen is effective;  continue present plan and medications.  3. Nocturnal sleep-related eating disorder Currently taking Wellbutrin 150 mg daily.  He reports eating more at night.  Plan:  Continue present plan and medications.  He will also be starting Ozempic 0.25 mg subcutaneously weekly.   Night Eating Syndrome (NES) is currently included in the "Other Specified Feeding or Eating Disorder" category of the DSM-5.  Counseling Night Eating Syndrome is characterized by eating after awakening from sleep or by  excessive food consumption (usually > 25% of daily calories) after the evening meal. It is "relapsing and remitting," meaning that it comes and goes. It usually worsens during stressful times in your life. The person is aware and recalls the eating. Night Eating Syndrome usually starts in early adulthood. Men and women are both affected, but women tend to have more severe symptoms.  Interesting statistics: The prevalence of NES is 1.5% of the general population, but found in 8.9% of patients that enroll in an Andrew medicine program. Approximately 15-20% ALSO have Binge Eating Disorder.  Treatment for Night Eating Syndrome include: CBT, SSRIs, Topiramate, Progressive Muscle Relaxation, Phototherapy, and Normalized Eating.  4. Pure hypercholesterolemia Course: Not at goal. Lipid-lowering medications: None.   Plan: Dietary changes: Increase soluble fiber, decrease simple carbohydrates, decrease saturated fat. Exercise changes: Moderate to vigorous-intensity aerobic activity 150 minutes per week or as tolerated. We will continue to monitor along with PCP/specialists as it pertains to his weight loss journey.  Lab Results  Component Value Date   CHOL 199 08/25/2020   HDL 43 08/25/2020   LDLCALC 141 (H) 08/25/2020   TRIG 82 08/25/2020   CHOLHDL 4.6 08/25/2020   Lab Results  Component Value Date   ALT 48 (H) 08/25/2020   AST 35 08/25/2020   ALKPHOS 98 08/25/2020   BILITOT 0.7 08/25/2020   The 10-year ASCVD risk score Andrew Bussing DC Jr., et al., 2013) is: 1.6%   Values used to calculate the score:     Age: 40 years     Sex: Male     Is Non-Hispanic African American: No  Diabetic: No     Tobacco smoker: No     Systolic Blood Pressure: 545 mmHg     Is BP treated: Yes     HDL Cholesterol: 43 mg/dL     Total Cholesterol: 199 mg/dL  5. Fatty liver NAFLD is an umbrella term that encompasses a disease spectrum that includes steatosis (fat) without inflammation, steatohepatitis (NASH; fat +  inflammation in a characteristic pattern), and cirrhosis. Bland steatosis is felt to be a benign condition, with extremely low to no risk of progression to cirrhosis, whereas NASH can progress to cirrhosis. The mainstay of treatment of NAFLD includes lifestyle modification to achieve weight loss, at least 7% of current body weight. Low carbohydrate diets can be beneficial in improving NAFLD liver histology. Additionally, exercise, even the absence of weight loss can have beneficial effects on the patient's metabolic profile and liver health.   6. Andrew, current BMI 34.9  Course: Andrew Gray is currently in the action stage of change. As such, his goal is to continue with weight loss efforts.   Nutrition goals: He has agreed to keeping a food journal and adhering to recommended goals of 2000-2500 calories and 125 grams of protein.   Exercise goals: For substantial health benefits, adults should do at least 150 minutes (2 hours and 30 minutes) a week of moderate-intensity, or 75 minutes (1 hour and 15 minutes) a week of vigorous-intensity aerobic physical activity, or an equivalent combination of moderate- and vigorous-intensity aerobic activity. Aerobic activity should be performed in episodes of at least 10 minutes, and preferably, it should be spread throughout the week.  Behavioral modification strategies: increasing lean protein intake, decreasing simple carbohydrates, increasing vegetables and increasing water intake.  Andrew Gray has agreed to follow-up with our clinic in 3 weeks. He was informed of the importance of frequent follow-up visits to maximize his success with intensive lifestyle modifications for his multiple health conditions.   Objective:   Blood pressure 126/72, pulse 60, temperature 98 F (36.7 C), temperature source Oral, height 6' (1.829 m), weight 257 lb (116.6 kg), SpO2 98 %. Body mass index is 34.86 kg/m.  General: Cooperative, alert, well developed, in no acute distress. HEENT:  Conjunctivae and lids unremarkable. Cardiovascular: Regular rhythm.  Lungs: Normal work of breathing. Neurologic: No focal deficits.   Lab Results  Component Value Date   CREATININE 1.03 08/25/2020   BUN 21 08/25/2020   NA 143 08/25/2020   K 3.9 08/25/2020   CL 103 08/25/2020   CO2 22 08/25/2020   Lab Results  Component Value Date   ALT 48 (H) 08/25/2020   AST 35 08/25/2020   ALKPHOS 98 08/25/2020   BILITOT 0.7 08/25/2020   Lab Results  Component Value Date   HGBA1C 4.4 Repeated and verified X2. (L) 03/30/2020   HGBA1C 4.6 08/26/2018   HGBA1C 4.7 07/12/2015   HGBA1C 4.9 02/22/2015   Lab Results  Component Value Date   INSULIN 17.7 03/30/2020   Lab Results  Component Value Date   TSH 1.32 03/30/2020   Lab Results  Component Value Date   CHOL 199 08/25/2020   HDL 43 08/25/2020   LDLCALC 141 (H) 08/25/2020   TRIG 82 08/25/2020   CHOLHDL 4.6 08/25/2020   Lab Results  Component Value Date   WBC 4.1 03/30/2020   HGB 14.7 03/30/2020   HCT 41.7 03/30/2020   MCV 83.4 03/30/2020   PLT 191.0 03/30/2020   Attestation Statements:   Reviewed by clinician on day of visit: allergies, medications, problem  list, medical history, surgical history, family history, social history, and previous encounter notes.  I, Water quality scientist, CMA, am acting as transcriptionist for Briscoe Deutscher, DO  I have reviewed the above documentation for accuracy and completeness, and I agree with the above. Briscoe Deutscher, DO

## 2020-10-07 ENCOUNTER — Other Ambulatory Visit (HOSPITAL_COMMUNITY): Payer: Self-pay

## 2020-10-07 DIAGNOSIS — F902 Attention-deficit hyperactivity disorder, combined type: Secondary | ICD-10-CM | POA: Diagnosis not present

## 2020-10-07 DIAGNOSIS — Z79899 Other long term (current) drug therapy: Secondary | ICD-10-CM | POA: Diagnosis not present

## 2020-10-07 MED ORDER — METHYLPHENIDATE HCL ER (CD) 30 MG PO CPCR
ORAL_CAPSULE | ORAL | 0 refills | Status: DC
  Filled 2020-10-07 – 2020-10-17 (×2): qty 30, 30d supply, fill #0

## 2020-10-11 DIAGNOSIS — F902 Attention-deficit hyperactivity disorder, combined type: Secondary | ICD-10-CM | POA: Diagnosis not present

## 2020-10-11 DIAGNOSIS — Z79899 Other long term (current) drug therapy: Secondary | ICD-10-CM | POA: Diagnosis not present

## 2020-10-12 DIAGNOSIS — F411 Generalized anxiety disorder: Secondary | ICD-10-CM | POA: Diagnosis not present

## 2020-10-14 ENCOUNTER — Other Ambulatory Visit (HOSPITAL_COMMUNITY): Payer: Self-pay

## 2020-10-14 ENCOUNTER — Other Ambulatory Visit: Payer: Self-pay | Admitting: Sports Medicine

## 2020-10-14 DIAGNOSIS — I10 Essential (primary) hypertension: Secondary | ICD-10-CM

## 2020-10-14 MED ORDER — HYDROCHLOROTHIAZIDE 25 MG PO TABS
ORAL_TABLET | Freq: Every day | ORAL | 3 refills | Status: DC
  Filled 2020-10-14: qty 90, 90d supply, fill #0

## 2020-10-14 MED ORDER — AMLODIPINE BESYLATE 10 MG PO TABS
ORAL_TABLET | Freq: Every day | ORAL | 3 refills | Status: DC
  Filled 2020-10-14: qty 90, 90d supply, fill #0

## 2020-10-17 ENCOUNTER — Other Ambulatory Visit (HOSPITAL_COMMUNITY): Payer: Self-pay

## 2020-10-26 ENCOUNTER — Other Ambulatory Visit: Payer: Self-pay

## 2020-10-26 ENCOUNTER — Ambulatory Visit (INDEPENDENT_AMBULATORY_CARE_PROVIDER_SITE_OTHER): Payer: 59 | Admitting: Family Medicine

## 2020-10-26 ENCOUNTER — Other Ambulatory Visit (HOSPITAL_COMMUNITY): Payer: Self-pay

## 2020-10-26 ENCOUNTER — Encounter (INDEPENDENT_AMBULATORY_CARE_PROVIDER_SITE_OTHER): Payer: Self-pay | Admitting: Family Medicine

## 2020-10-26 VITALS — BP 116/72 | HR 63 | Temp 97.8°F | Ht 72.0 in | Wt 254.0 lb

## 2020-10-26 DIAGNOSIS — G478 Other sleep disorders: Secondary | ICD-10-CM

## 2020-10-26 DIAGNOSIS — K760 Fatty (change of) liver, not elsewhere classified: Secondary | ICD-10-CM

## 2020-10-26 DIAGNOSIS — Z6835 Body mass index (BMI) 35.0-35.9, adult: Secondary | ICD-10-CM | POA: Diagnosis not present

## 2020-10-26 DIAGNOSIS — R7301 Impaired fasting glucose: Secondary | ICD-10-CM | POA: Diagnosis not present

## 2020-10-26 DIAGNOSIS — E66812 Obesity, class 2: Secondary | ICD-10-CM

## 2020-10-26 MED ORDER — OZEMPIC (0.25 OR 0.5 MG/DOSE) 2 MG/1.5ML ~~LOC~~ SOPN
0.5000 mg | PEN_INJECTOR | SUBCUTANEOUS | 0 refills | Status: AC
  Filled 2020-10-26: qty 1.5, 28d supply, fill #0

## 2020-10-26 MED ORDER — INSULIN PEN NEEDLE 32G X 4 MM MISC
1.0000 | 0 refills | Status: DC
  Filled 2020-10-26: qty 100, fill #0

## 2020-10-26 NOTE — Progress Notes (Signed)
Chief Complaint:   OBESITY Andrew Gray is here to discuss his progress with his obesity treatment plan along with follow-up of his obesity related diagnoses. See Medical Weight Management Flowsheet for complete bioelectrical impedance results.  Today's visit was #: 7 Starting weight: 260 lbs Starting date: 05/19/2020 Today's weight: 254 lbs Today's date: 10/26/2020 Weight change since last visit: 3 lbs Total lbs lost to date: 6 lbs Body mass index is 34.45 kg/m.  Total weight loss percentage to date: -2.31%  Interim History: Andrew Gray says that he is tolerating the Ozempic.  He endorses less snacking. Nutrition Plan: keeping a food journal and adhering to recommended goals of 2000-2500 calories and 125 grams of protein for 90% of the time. Activity:  Walking/cardio for 30-60 minutes 5-7 times per week. Anti-obesity medications: Ozempic 0.5 mg subcutaneously weekly. Reported side effects: None.  Assessment/Plan:   1. Impaired fasting glucose Continue Ozempic 0.5 mg subcutaneously weekly, as per below.  - Refill Semaglutide,0.25 or 0.5MG /DOS, (OZEMPIC, 0.25 OR 0.5 MG/DOSE,) 2 MG/1.5ML SOPN; Inject 0.5 mg into the skin once a week for 14 days.  Dispense: 1.5 mL; Refill: 0 - Insulin Pen Needle 32G X 4 MM MISC; Use 1 needle once per week.  Dispense: 10 each; Refill: 0  2. Nocturnal sleep-related eating disorder Topiramate is still helpful and has the added benefit of decreased migraine/aura incidence. Plan:  continue topiramate.  Night Eating Syndrome (NES) is currently included in the "Other Specified Feeding or Eating Disorder" category of the DSM-5.  Counseling Night Eating Syndrome is characterized by eating after awakening from sleep or by excessive food consumption (usually > 25% of daily calories) after the evening meal. It is "relapsing and remitting," meaning that it comes and goes. It usually worsens during stressful times in your life. The person is aware and recalls the eating.  Night Eating Syndrome usually starts in early adulthood. Men and women are both affected, but women tend to have more severe symptoms.  Interesting statistics: The prevalence of NES is 1.5% of the general population, but found in 8.9% of patients that enroll in an obesity medicine program. Approximately 15-20% ALSO have Binge Eating Disorder.  Treatment for Night Eating Syndrome include: CBT, SSRIs, Topiramate, Progressive Muscle Relaxation, Phototherapy, and Normalized Eating.  3. Hepatic steatosis NAFLD is an umbrella term that encompasses a disease spectrum that includes steatosis (fat) without inflammation, steatohepatitis (NASH; fat + inflammation in a characteristic pattern), and cirrhosis. Bland steatosis is felt to be a benign condition, with extremely low to no risk of progression to cirrhosis, whereas NASH can progress to cirrhosis. The mainstay of treatment of NAFLD includes lifestyle modification to achieve weight loss, at least 7% of current body weight. Low carbohydrate diets can be beneficial in improving NAFLD liver histology. Additionally, exercise, even the absence of weight loss can have beneficial effects on the patient's metabolic profile and liver health.   4. Obesity, current BMI 34.5  Course: Andrew Gray is currently in the action stage of change. As such, his goal is to continue with weight loss efforts.   Nutrition goals: He has agreed to keeping a food journal and adhering to recommended goals of 2000-2500 calories and 125 grams of protein.   Exercise goals:  As is.  Behavioral modification strategies: better snacking choices and emotional eating strategies.  Andrew Gray has agreed to follow-up with our clinic in 4 weeks. He was informed of the importance of frequent follow-up visits to maximize his success with intensive lifestyle modifications for his  multiple health conditions.   Objective:   Blood pressure 116/72, pulse 63, temperature 97.8 F (36.6 C), temperature source  Oral, height 6' (1.829 m), weight 254 lb (115.2 kg), SpO2 98 %. Body mass index is 34.45 kg/m.  General: Cooperative, alert, well developed, in no acute distress. HEENT: Conjunctivae and lids unremarkable. Cardiovascular: Regular rhythm.  Lungs: Normal work of breathing. Neurologic: No focal deficits.   Lab Results  Component Value Date   CREATININE 1.03 08/25/2020   BUN 21 08/25/2020   NA 143 08/25/2020   K 3.9 08/25/2020   CL 103 08/25/2020   CO2 22 08/25/2020   Lab Results  Component Value Date   ALT 48 (H) 08/25/2020   AST 35 08/25/2020   ALKPHOS 98 08/25/2020   BILITOT 0.7 08/25/2020   Lab Results  Component Value Date   HGBA1C 4.4 Repeated and verified X2. (L) 03/30/2020   HGBA1C 4.6 08/26/2018   HGBA1C 4.7 07/12/2015   HGBA1C 4.9 02/22/2015   Lab Results  Component Value Date   INSULIN 17.7 03/30/2020   Lab Results  Component Value Date   TSH 1.32 03/30/2020   Lab Results  Component Value Date   CHOL 199 08/25/2020   HDL 43 08/25/2020   LDLCALC 141 (H) 08/25/2020   TRIG 82 08/25/2020   CHOLHDL 4.6 08/25/2020   Lab Results  Component Value Date   WBC 4.1 03/30/2020   HGB 14.7 03/30/2020   HCT 41.7 03/30/2020   MCV 83.4 03/30/2020   PLT 191.0 03/30/2020   Attestation Statements:   Reviewed by clinician on day of visit: allergies, medications, problem list, medical history, surgical history, family history, social history, and previous encounter notes.  I, Water quality scientist, CMA, am acting as transcriptionist for Briscoe Deutscher, DO  I have reviewed the above documentation for accuracy and completeness, and I agree with the above. Briscoe Deutscher, DO

## 2020-10-28 ENCOUNTER — Other Ambulatory Visit (HOSPITAL_COMMUNITY): Payer: Self-pay

## 2020-11-11 ENCOUNTER — Other Ambulatory Visit: Payer: Self-pay | Admitting: Internal Medicine

## 2020-11-12 ENCOUNTER — Other Ambulatory Visit (HOSPITAL_COMMUNITY): Payer: Self-pay

## 2020-11-12 ENCOUNTER — Other Ambulatory Visit: Payer: Self-pay | Admitting: Internal Medicine

## 2020-11-12 DIAGNOSIS — M1A0790 Idiopathic chronic gout, unspecified ankle and foot, without tophus (tophi): Secondary | ICD-10-CM

## 2020-11-14 ENCOUNTER — Other Ambulatory Visit (HOSPITAL_COMMUNITY): Payer: Self-pay

## 2020-11-14 MED ORDER — PANTOPRAZOLE SODIUM 40 MG PO TBEC
DELAYED_RELEASE_TABLET | Freq: Every day | ORAL | 3 refills | Status: DC
  Filled 2020-11-14: qty 90, 90d supply, fill #0
  Filled 2021-02-12: qty 90, 90d supply, fill #1
  Filled 2021-05-13: qty 90, 90d supply, fill #2
  Filled 2021-08-09: qty 90, 90d supply, fill #3

## 2020-11-14 MED FILL — Febuxostat Tab 80 MG: ORAL | 90 days supply | Qty: 90 | Fill #0 | Status: AC

## 2020-11-15 ENCOUNTER — Other Ambulatory Visit (HOSPITAL_COMMUNITY): Payer: Self-pay

## 2020-11-16 ENCOUNTER — Other Ambulatory Visit (HOSPITAL_COMMUNITY): Payer: Self-pay

## 2020-11-16 DIAGNOSIS — F411 Generalized anxiety disorder: Secondary | ICD-10-CM | POA: Diagnosis not present

## 2020-11-18 ENCOUNTER — Other Ambulatory Visit: Payer: Self-pay

## 2020-11-18 ENCOUNTER — Encounter: Payer: Self-pay | Admitting: Pulmonary Disease

## 2020-11-18 ENCOUNTER — Ambulatory Visit: Payer: 59 | Admitting: Pulmonary Disease

## 2020-11-18 VITALS — BP 132/78 | HR 75 | Temp 97.9°F | Ht 72.0 in | Wt 257.6 lb

## 2020-11-18 DIAGNOSIS — G4733 Obstructive sleep apnea (adult) (pediatric): Secondary | ICD-10-CM

## 2020-11-18 NOTE — Telephone Encounter (Signed)
Instructed pt to call office to set up OV. 6 month f/u OV with Dr. Halford Chessman is overdue. Nothing further needed at this time.

## 2020-11-18 NOTE — Progress Notes (Signed)
[facility]  Chief Complaint  Patient presents with   Follow-up    OSA  follow up,     Constitutional:  BP 132/78 (BP Location: Left Arm, Patient Position: Sitting, Cuff Size: Large)   Pulse 75   Temp 97.9 F (36.6 C) (Oral)   Ht 6' (1.829 m)   Wt 257 lb 9.6 oz (116.8 kg)   SpO2 98%   BMI 34.94 kg/m   Past Medical History:  HTN, Gout, HLD, ADHD, Fatty liver, GERD, Lactose intolerance, Allergies  Past Surgical History:  He  has a past surgical history that includes Vasectomy; Elbow surgery (Left); and Knee surgery.  Brief Summary:  Andrew Gray is a 40 y.o. male with obstructive sleep apnea.      Subjective:   He is doing well with CPAP.  Has nasal cushion mask.  No issues with mask leak, sinus congestion, or dry mouth.  Sleeping well and feels rested. Working with [facility] Weight loss program.  Physical Exam:   Appearance - well kempt   ENMT - no sinus tenderness, no oral exudate, no LAN, Mallampati 4 airway, no stridor, enlarged tongue  Respiratory - equal breath sounds bilaterally, no wheezing or rales  CV - s1s2 regular rate and rhythm, no murmurs  Ext - no clubbing, no edema  Skin - no rashes  Psych - normal mood and affect   Sleep Tests:  HST 12/27/19 >> AHI 22.2, SpO2 low 83% Auto CPAP 10/19/20 to 11/17/20 >> used on 30 of 30 nights with average 7 hrs 32 min.  Average AHI 0.7 with median CPAP 6 and 95 th percentile CPAP 8 cm H2O  Social History:  He  reports that he has never smoked. He has never used smokeless tobacco. He reports current alcohol use. He reports that he does not use drugs.  Family History:  His family history includes Anxiety disorder in his mother.     Assessment/Plan:   Obstructive sleep apnea. - he is compliant with CPAP and reports benefit from therapy - uses [employer] for his DME - continue auto CPAP 6 to 20 cm H2O  Time Spent Involved in Patient Care on Day of Examination:  16  minutes  Follow up:   Patient Instructions  Follow up in 1 year  Medication List:   Allergies as of 11/18/2020       Reactions   Sulfa Antibiotics Nausea And Vomiting        Medication List        Accurate as of November 18, 2020  3:46 PM. If you have any questions, ask your nurse or doctor.          amLODipine 10 MG tablet Commonly known as: NORVASC TAKE 1 TABLET (10 MG TOTAL) BY MOUTH DAILY.   Azelaic Acid 15 % gel Apply 1 application topically 2 (two) times daily.   buPROPion 150 MG 24 hr tablet Commonly known as: WELLBUTRIN XL TAKE 1 TABLET BY MOUTH ONCE A DAY   cetirizine 10 MG tablet Commonly known as: ZYRTEC Take 10 mg by mouth daily.   Febuxostat 80 MG Tabs TAKE 1 TABLET (80 MG TOTAL) BY MOUTH DAILY.   gabapentin 300 MG capsule Commonly known as: NEURONTIN Take 1 capsule (300 mg total) by mouth at bedtime.   hydrochlorothiazide 25 MG tablet Commonly known as: HYDRODIURIL TAKE 1 TABLET (25 MG TOTAL) BY MOUTH DAILY.   Insulin Pen Needle 32G X 4 MM Misc Use 1 needle  once per week.   methylphenidate 30 MG CR capsule Commonly known as: METADATE CD Take 1 capsule by mouth daily. (1 capsule by mouth daily)   Ozempic (0.25 or 0.5 MG/DOSE) 2 MG/1.5ML Sopn Generic drug: Semaglutide(0.25 or 0.5MG /DOS) Inject 0.5 mg into the skin once a week for 14 days.   pantoprazole 40 MG tablet Commonly known as: PROTONIX Take 1 tablet by mouth daily.   tadalafil 20 MG tablet Commonly known as: CIALIS Take 1 tablet (20 mg total) by mouth daily as needed for erectile dysfunction (please use troches).   Trokendi XR 50 MG Cp24 Generic drug: Topiramate ER TAKE 1 CAPSULE BY MOUTH ONCE A DAY        Signature:  Chesley Mires, MD [place] Pager - [phone] 11/18/2020, 3:46 PM

## 2020-11-18 NOTE — Patient Instructions (Signed)
Follow up in 1 year.

## 2020-11-23 ENCOUNTER — Other Ambulatory Visit (HOSPITAL_COMMUNITY): Payer: Self-pay

## 2020-11-23 ENCOUNTER — Encounter (INDEPENDENT_AMBULATORY_CARE_PROVIDER_SITE_OTHER): Payer: Self-pay | Admitting: Family Medicine

## 2020-11-23 ENCOUNTER — Other Ambulatory Visit: Payer: Self-pay

## 2020-11-23 ENCOUNTER — Ambulatory Visit (INDEPENDENT_AMBULATORY_CARE_PROVIDER_SITE_OTHER): Payer: 59 | Admitting: Family Medicine

## 2020-11-23 VITALS — BP 129/75 | HR 68 | Temp 97.6°F | Ht 72.0 in | Wt 250.0 lb

## 2020-11-23 DIAGNOSIS — R632 Polyphagia: Secondary | ICD-10-CM | POA: Diagnosis not present

## 2020-11-23 DIAGNOSIS — Z6835 Body mass index (BMI) 35.0-35.9, adult: Secondary | ICD-10-CM

## 2020-11-23 DIAGNOSIS — Z724 Inappropriate diet and eating habits: Secondary | ICD-10-CM | POA: Diagnosis not present

## 2020-11-23 DIAGNOSIS — K760 Fatty (change of) liver, not elsewhere classified: Secondary | ICD-10-CM | POA: Diagnosis not present

## 2020-11-23 DIAGNOSIS — E7800 Pure hypercholesterolemia, unspecified: Secondary | ICD-10-CM

## 2020-11-23 MED ORDER — INSULIN PEN NEEDLE 32G X 4 MM MISC
1.0000 | 0 refills | Status: DC
  Filled 2020-11-23: qty 100, 90d supply, fill #0

## 2020-11-26 ENCOUNTER — Other Ambulatory Visit (HOSPITAL_COMMUNITY): Payer: Self-pay

## 2020-11-28 ENCOUNTER — Other Ambulatory Visit (HOSPITAL_COMMUNITY): Payer: Self-pay

## 2020-12-02 NOTE — Progress Notes (Signed)
Chief Complaint:   OBESITY Andrew Gray is here to discuss his progress with his obesity treatment plan along with follow-up of his obesity related diagnoses.   Today's visit was #: 8 Starting weight: 260 lbs Starting date: 05/19/2020 Today's weight: 250 lbs Today's date: 11/23/2020 Weight change since last visit: 4 lbs Total lbs lost to date: 10 lbs Body mass index is 33.91 kg/m.  Total weight loss percentage to date: -3.85%  Interim History:  Andrew Gray did well with 0.25 mg dose of Ozempic.  He says he had 2 episodes of severe diarrhea after starting the 0.5 mg dose.  Denies polyphagia.    Current Meal Plan: keeping a food journal and adhering to recommended goals of 2000-2500 calories and 125 grams of protein for 90% of the time.  Current Exercise Plan: Walking/cardio for 30 minutes 5 times per week. Current Anti-Obesity Medications: Ozempic 0.25 mg subcutaneously weekly. Side effects: None.  Assessment/Plan:   1. Hepatic steatosis Lab results reviewed. Counseling: Intensive lifestyle modifications are the first line treatment for this issue. We discussed several lifestyle modifications today and he will continue to work on diet, exercise and weight loss efforts.   2. Pure hypercholesterolemia Course: Not optimized. Lipid-lowering medications: None.   Plan: Dietary changes: Increase soluble fiber, decrease simple carbohydrates, decrease saturated fat. Exercise changes: Moderate to vigorous-intensity aerobic activity 150 minutes per week or as tolerated. We will continue to monitor along with PCP/specialists as it pertains to his weight loss journey.  Lab Results  Component Value Date   CHOL 199 08/25/2020   HDL 43 08/25/2020   LDLCALC 141 (H) 08/25/2020   TRIG 82 08/25/2020   CHOLHDL 4.6 08/25/2020   Lab Results  Component Value Date   ALT 48 (H) 08/25/2020   AST 35 08/25/2020   ALKPHOS 98 08/25/2020   BILITOT 0.7 08/25/2020   The 10-year ASCVD risk score Mikey Bussing DC Jr., et  al., 2013) is: 1.6%   Values used to calculate the score:     Age: 40 years     Sex: Male     Is Non-Hispanic African American: No     Diabetic: No     Tobacco smoker: No     Systolic Blood Pressure: [id number] mmHg     Is BP treated: Yes     HDL Cholesterol: 43 mg/dL     Total Cholesterol: 199 mg/dL  3. 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  Counseling Night Eating Syndrome is characterized by eating after awakening from sleep or by excessive food consumption (usually > 25% of daily calories) after the evening meal. It is "relapsing and remitting," meaning that it comes and goes. It usually worsens during stressful times in your life. The person is aware and recalls the eating. Night Eating Syndrome usually starts in early adulthood. Men and women are both affected, but women tend to have more severe symptoms.  Interesting statistics: The prevalence of NES is 1.5% of the general population, but found in 8.9% of patients that enroll in an obesity medicine program. Approximately 15-20% ALSO have Binge Eating Disorder.  Treatment for Night Eating Syndrome include: CBT, SSRIs, Topiramate, Progressive Muscle Relaxation, Phototherapy, and Normalized Eating.  4. Polyphagia Improved with Ozempic. Current treatment: Ozempic 0.25 mg subcutaneously weekly. Polyphagia refers to excessive feelings of hunger.   Plan:  May try 0.25 mg dose of Ozempic for  several weeks.  Also okay to take 0.25 mg subcutaneously every 4 days.  He will continue to focus on protein-rich, low simple carbohydrate foods. We reviewed the importance of hydration, regular exercise for stress reduction, and restorative sleep.  - Refill Insulin Pen Needle 32G X 4 MM MISC; Use 1 pen needle once weekly as directed.  Dispense: 100 each; Refill: 0  5. Obesity with current BMI of  34.0  Course: Andrew Gray is currently in the action stage of change. As such, his goal is to continue with weight loss efforts.   Nutrition goals: He has agreed to keeping a food journal and adhering to recommended goals of 2000-2500 calories and 125 grams of protein.   Exercise goals:  As is.  Behavioral modification strategies: increasing lean protein intake, decreasing simple carbohydrates, increasing vegetables, increasing water intake, and decreasing liquid calories.  Andrew Gray has agreed to follow-up with our clinic in 4 weeks. He was informed of the importance of frequent follow-up visits to maximize his success with intensive lifestyle modifications for his multiple health conditions.   Objective:   Blood pressure 129/75, pulse 68, temperature 97.6 F (36.4 C), temperature source Oral, height 6' (1.829 m), weight 250 lb (113.4 kg), SpO2 98 %. Body mass index is 33.91 kg/m.  General: Cooperative, alert, well developed, in no acute distress. HEENT: Conjunctivae and lids unremarkable. Cardiovascular: Regular rhythm.  Lungs: Normal work of breathing. Neurologic: No focal deficits.   Lab Results  Component Value Date   CREATININE 1.03 08/25/2020   BUN 21 08/25/2020   NA 143 08/25/2020   K 3.9 08/25/2020   CL 103 08/25/2020   CO2 22 08/25/2020   Lab Results  Component Value Date   ALT 48 (H) 08/25/2020   AST 35 08/25/2020   ALKPHOS 98 08/25/2020   BILITOT 0.7 08/25/2020   Lab Results  Component Value Date   HGBA1C 4.4 Repeated and verified X2. (L) 03/30/2020   HGBA1C 4.6 08/26/2018   HGBA1C 4.7 07/12/2015   HGBA1C 4.9 02/22/2015   Lab Results  Component Value Date   INSULIN 17.7 03/30/2020   Lab Results  Component Value Date   TSH 1.32 03/30/2020   Lab Results  Component Value Date   CHOL 199 08/25/2020   HDL 43 08/25/2020   LDLCALC 141 (H) 08/25/2020   TRIG 82 08/25/2020   CHOLHDL 4.6 08/25/2020   Lab Results  Component Value Date   VD25OH 40 08/26/2018    VD25OH 37 08/07/2016   VD25OH 57 07/12/2015   Lab Results  Component Value Date   WBC 4.1 03/30/2020   HGB 14.7 03/30/2020   HCT 41.7 03/30/2020   MCV 83.4 03/30/2020   PLT 191.0 03/30/2020   Attestation Statements:   Reviewed by clinician on day of visit: allergies, medications, problem list, medical history, surgical history, family history, social history, and previous encounter notes.  I, Water quality scientist, CMA, am acting as transcriptionist for Briscoe Deutscher, DO  I have reviewed the above documentation for accuracy and completeness, and I agree with the above. Briscoe Deutscher, DO

## 2020-12-07 DIAGNOSIS — F411 Generalized anxiety disorder: Secondary | ICD-10-CM | POA: Diagnosis not present

## 2020-12-08 ENCOUNTER — Encounter (INDEPENDENT_AMBULATORY_CARE_PROVIDER_SITE_OTHER): Payer: Self-pay | Admitting: Family Medicine

## 2020-12-08 DIAGNOSIS — R7301 Impaired fasting glucose: Secondary | ICD-10-CM

## 2020-12-08 NOTE — Telephone Encounter (Signed)
Last OV with Dr Wallace 

## 2020-12-12 ENCOUNTER — Other Ambulatory Visit (HOSPITAL_COMMUNITY): Payer: Self-pay

## 2020-12-12 MED ORDER — METHYLPHENIDATE HCL ER (CD) 30 MG PO CPCR
ORAL_CAPSULE | ORAL | 0 refills | Status: DC
  Filled 2020-12-12: qty 30, 30d supply, fill #0

## 2020-12-12 MED ORDER — TIRZEPATIDE 2.5 MG/0.5ML ~~LOC~~ SOAJ
2.5000 mg | SUBCUTANEOUS | 0 refills | Status: DC
Start: 2020-12-12 — End: 2021-01-12
  Filled 2020-12-12: qty 2, 28d supply, fill #0

## 2020-12-13 ENCOUNTER — Other Ambulatory Visit (HOSPITAL_COMMUNITY): Payer: Self-pay

## 2020-12-16 ENCOUNTER — Ambulatory Visit: Payer: 59 | Admitting: Adult Health

## 2020-12-20 DIAGNOSIS — F411 Generalized anxiety disorder: Secondary | ICD-10-CM | POA: Diagnosis not present

## 2020-12-28 ENCOUNTER — Ambulatory Visit (INDEPENDENT_AMBULATORY_CARE_PROVIDER_SITE_OTHER): Payer: 59 | Admitting: Family Medicine

## 2020-12-28 ENCOUNTER — Encounter (INDEPENDENT_AMBULATORY_CARE_PROVIDER_SITE_OTHER): Payer: Self-pay | Admitting: Family Medicine

## 2020-12-28 ENCOUNTER — Other Ambulatory Visit: Payer: Self-pay

## 2020-12-28 VITALS — BP 118/72 | HR 64 | Temp 98.1°F | Ht 72.0 in | Wt 258.0 lb

## 2020-12-28 DIAGNOSIS — Z724 Inappropriate diet and eating habits: Secondary | ICD-10-CM | POA: Diagnosis not present

## 2020-12-28 DIAGNOSIS — G4733 Obstructive sleep apnea (adult) (pediatric): Secondary | ICD-10-CM | POA: Diagnosis not present

## 2020-12-28 DIAGNOSIS — Z6835 Body mass index (BMI) 35.0-35.9, adult: Secondary | ICD-10-CM | POA: Diagnosis not present

## 2020-12-28 DIAGNOSIS — R7301 Impaired fasting glucose: Secondary | ICD-10-CM

## 2020-12-28 DIAGNOSIS — K760 Fatty (change of) liver, not elsewhere classified: Secondary | ICD-10-CM | POA: Diagnosis not present

## 2020-12-28 NOTE — Progress Notes (Signed)
Chief Complaint:   OBESITY [name] is here to discuss his progress with his obesity treatment plan along with follow-up of his obesity related diagnoses. See Medical Weight Management Flowsheet for complete bioelectrical impedance results.  Today's visit was #: 9 Starting weight: 260 lbs Starting date: 05/19/2020 Weight change since last visit: 2 lbs Total lbs lost to date: 2 lbs Total weight loss percentage to date: -0.77%  Nutrition Plan: Keeping a food journal and adhering to recommended goals of 2000-2500 calories and 125 grams of protein daily for 80% of the time. Activity: Walking/cardio for 30-60 minutes 4-6 times per week. Anti-obesity medications: Mounjaro 2.5 subcutaneously weekly. Reported side effects: None.  Interim History: Andrew Gray says that Mounjaro caused diarrhea (took again last night).  He says he will try a probiotic.  He says he held Wellbutrin due to sexual side effects.  He felt that it helped with cravings and will consider combining with SSRI going forward if needed.  Assessment/Plan:   1. Impaired fasting glucose, with polyphagia Continue Mounjaro 2.5 mg subcutaneously weekly and add probiotic to regimen.  2. Hepatic steatosis Counseling: Intensive lifestyle modifications are the first line treatment for this issue. We discussed several lifestyle modifications today and he will continue to work on diet, exercise and weight loss efforts.   3. 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  Counseling Night Eating Syndrome is characterized by eating after awakening from sleep or by excessive food consumption (usually > 25% of daily calories) after the evening meal. It is "relapsing and remitting," meaning that it comes and goes. It usually worsens during stressful times in your life. The person is aware and recalls  the eating. Night Eating Syndrome usually starts in early adulthood. Men and women are both affected, but women tend to have more severe symptoms.  Interesting statistics: The prevalence of NES is 1.5% of the general population, but found in 8.9% of patients that enroll in an obesity medicine program. Approximately 15-20% ALSO have Binge Eating Disorder.  Treatment for Night Eating Syndrome include: CBT, SSRIs, Topiramate, Progressive Muscle Relaxation, Phototherapy, and Normalized Eating.  4. Obesity with current BMI of 35.1  Course: Andrew Gray is currently in the action stage of change. As such, his goal is to continue with weight loss efforts.   Nutrition goals: He has agreed to keeping a food journal and adhering to recommended goals of 2000-2500 calories and 125 grams of protein.   Exercise goals:  As is.  Behavioral modification strategies: increasing lean protein intake, decreasing simple carbohydrates, increasing vegetables, and increasing water intake.  Andrew Gray has agreed to follow-up with our clinic in 2 weeks. He was informed of the importance of frequent follow-up visits to maximize his success with intensive lifestyle modifications for his multiple health conditions.   Objective:   Blood pressure 118/72, pulse 64, temperature 98.1 F (36.7 C), temperature source Oral, height 6' (1.829 m), weight 258 lb (117 kg), SpO2 98 %. Body mass index is 34.99 kg/m.  General: Cooperative, alert, well developed, in no acute distress. HEENT: Conjunctivae and lids unremarkable. Cardiovascular: Regular rhythm.  Lungs: Normal work of breathing. Neurologic: No focal deficits.   Lab Results  Component Value Date   CREATININE 1.03 08/25/2020   BUN 21 08/25/2020   NA 143 08/25/2020   K 3.9 08/25/2020   CL 103 08/25/2020   CO2 22 08/25/2020  Lab Results  Component Value Date   ALT 48 (H) 08/25/2020   AST 35 08/25/2020   ALKPHOS 98 08/25/2020   BILITOT 0.7 08/25/2020   Lab Results   Component Value Date   HGBA1C 4.4 Repeated and verified X2. (L) 03/30/2020   HGBA1C 4.6 08/26/2018   HGBA1C 4.7 07/12/2015   HGBA1C 4.9 02/22/2015   Lab Results  Component Value Date   INSULIN 17.7 03/30/2020   Lab Results  Component Value Date   TSH 1.32 03/30/2020   Lab Results  Component Value Date   CHOL 199 08/25/2020   HDL 43 08/25/2020   LDLCALC 141 (H) 08/25/2020   TRIG 82 08/25/2020   CHOLHDL 4.6 08/25/2020   Lab Results  Component Value Date   VD25OH 40 08/26/2018   VD25OH 37 08/07/2016   VD25OH 57 07/12/2015   Lab Results  Component Value Date   WBC 4.1 03/30/2020   HGB 14.7 03/30/2020   HCT 41.7 03/30/2020   MCV 83.4 03/30/2020   PLT 191.0 03/30/2020   Attestation Statements:   Reviewed by clinician on day of visit: allergies, medications, problem list, medical history, surgical history, family history, social history, and previous encounter notes.  I, Water quality scientist, CMA, am acting as transcriptionist for Briscoe Deutscher, DO  I have reviewed the above documentation for accuracy and completeness, and I agree with the above. Briscoe Deutscher, DO

## 2021-01-10 DIAGNOSIS — F411 Generalized anxiety disorder: Secondary | ICD-10-CM | POA: Diagnosis not present

## 2021-01-12 ENCOUNTER — Encounter (INDEPENDENT_AMBULATORY_CARE_PROVIDER_SITE_OTHER): Payer: Self-pay | Admitting: Family Medicine

## 2021-01-12 ENCOUNTER — Other Ambulatory Visit: Payer: Self-pay

## 2021-01-12 ENCOUNTER — Other Ambulatory Visit (HOSPITAL_COMMUNITY): Payer: Self-pay

## 2021-01-12 ENCOUNTER — Ambulatory Visit (INDEPENDENT_AMBULATORY_CARE_PROVIDER_SITE_OTHER): Payer: 59 | Admitting: Family Medicine

## 2021-01-12 VITALS — BP 121/74 | HR 67 | Temp 98.1°F | Ht 72.0 in | Wt 254.0 lb

## 2021-01-12 DIAGNOSIS — E8881 Metabolic syndrome: Secondary | ICD-10-CM

## 2021-01-12 DIAGNOSIS — Z6835 Body mass index (BMI) 35.0-35.9, adult: Secondary | ICD-10-CM

## 2021-01-12 MED ORDER — TIRZEPATIDE 5 MG/0.5ML ~~LOC~~ SOAJ
5.0000 mg | SUBCUTANEOUS | 0 refills | Status: DC
Start: 2021-01-12 — End: 2021-02-09
  Filled 2021-01-12: qty 2, 28d supply, fill #0

## 2021-01-13 ENCOUNTER — Encounter: Payer: Self-pay | Admitting: Internal Medicine

## 2021-01-13 ENCOUNTER — Other Ambulatory Visit (HOSPITAL_COMMUNITY): Payer: Self-pay

## 2021-01-13 DIAGNOSIS — I10 Essential (primary) hypertension: Secondary | ICD-10-CM

## 2021-01-13 MED ORDER — AMLODIPINE BESYLATE 10 MG PO TABS
ORAL_TABLET | Freq: Every day | ORAL | 3 refills | Status: DC
  Filled 2021-01-13: qty 90, 90d supply, fill #0
  Filled 2021-04-08: qty 90, 90d supply, fill #1
  Filled 2021-07-10: qty 90, 90d supply, fill #2
  Filled 2021-10-13: qty 90, 90d supply, fill #3

## 2021-01-13 MED ORDER — HYDROCHLOROTHIAZIDE 25 MG PO TABS
ORAL_TABLET | Freq: Every day | ORAL | 3 refills | Status: DC
  Filled 2021-01-13: qty 90, 90d supply, fill #0
  Filled 2021-04-08: qty 90, 90d supply, fill #1
  Filled 2021-07-10: qty 90, 90d supply, fill #2
  Filled 2021-10-06: qty 90, 90d supply, fill #3

## 2021-01-16 NOTE — Progress Notes (Signed)
Chief Complaint:   OBESITY Andrew Gray is here to discuss his progress with his obesity treatment plan along with follow-up of his obesity related diagnoses.   Today's visit was #: 10 Starting weight: 260 lbs Starting date: 05/19/2020 Today's weight: 254 lbs Today's date: 01/12/2021 Weight change since last visit: 4 lbs Total lbs lost to date: 6 lbs Body mass index is 34.45 kg/m.  Total weight loss percentage to date: -2.31%  Current Meal Plan: keeping a food journal and adhering to recommended goals of 2000-2500 calories and 120 grams of protein for 90% of the time.  Current Exercise Plan: Cardio for 30-60 minutes 5 times per week. Current Anti-Obesity Medications: Mounjaro 2.5 mg subcutaneously weekly. Side effects: None.  Interim History:  Andrew Gray says he has increased his protein at breakfast, which has helped set up his day.  He is tolerating Mounjaro.  Assessment/Plan:   1. Insulin resistance, with polyphagia Not at goal. Goal is HgbA1c < 5.7, fasting insulin closer to 5.  Medication: Mounjaro 2.5 mg subcutaneously weekly.    Plan:  Increase Mounjaro to 5 mg subcutaneously weekly.  He will continue to focus on protein-rich, low simple carbohydrate foods. We reviewed the importance of hydration, regular exercise for stress reduction, and restorative sleep.   Lab Results  Component Value Date   HGBA1C 4.4 Repeated and verified X2. (L) 03/30/2020   Lab Results  Component Value Date   INSULIN 17.7 03/30/2020   - Increase and refill tirzepatide (MOUNJARO) 5 MG/0.5ML Pen; Inject 5 mg into the skin once a week.  Dispense: 2 mL; Refill: 0  2. Obesity with current BMI of 34.5  Course: Andrew Gray is currently in the action stage of change. As such, his goal is to continue with weight loss efforts.   Nutrition goals: He has agreed to keeping a food journal and adhering to recommended goals of 2000-2500 calories and 125 grams of protein.   Exercise goals:  As is.  Behavioral  modification strategies: increasing lean protein intake, decreasing simple carbohydrates, increasing vegetables, and increasing water intake.  Andrew Gray has agreed to follow-up with our clinic in 3 weeks. He was informed of the importance of frequent follow-up visits to maximize his success with intensive lifestyle modifications for his multiple health conditions.   Objective:   Blood pressure 121/74, pulse 67, temperature 98.1 F (36.7 C), temperature source Oral, height 6' (1.829 m), weight 254 lb (115.2 kg), SpO2 98 %. Body mass index is 34.45 kg/m.  General: Cooperative, alert, well developed, in no acute distress. HEENT: Conjunctivae and lids unremarkable. Cardiovascular: Regular rhythm.  Lungs: Normal work of breathing. Neurologic: No focal deficits.   Lab Results  Component Value Date   CREATININE 1.03 08/25/2020   BUN 21 08/25/2020   NA 143 08/25/2020   K 3.9 08/25/2020   CL 103 08/25/2020   CO2 22 08/25/2020   Lab Results  Component Value Date   ALT 48 (H) 08/25/2020   AST 35 08/25/2020   ALKPHOS 98 08/25/2020   BILITOT 0.7 08/25/2020   Lab Results  Component Value Date   HGBA1C 4.4 Repeated and verified X2. (L) 03/30/2020   HGBA1C 4.6 08/26/2018   HGBA1C 4.7 07/12/2015   HGBA1C 4.9 02/22/2015   Lab Results  Component Value Date   INSULIN 17.7 03/30/2020   Lab Results  Component Value Date   TSH 1.32 03/30/2020   Lab Results  Component Value Date   CHOL 199 08/25/2020   HDL 43 08/25/2020  LDLCALC 141 (H) 08/25/2020   TRIG 82 08/25/2020   CHOLHDL 4.6 08/25/2020   Lab Results  Component Value Date   VD25OH 40 08/26/2018   VD25OH 37 08/07/2016   VD25OH 57 07/12/2015   Lab Results  Component Value Date   WBC 4.1 03/30/2020   HGB 14.7 03/30/2020   HCT 41.7 03/30/2020   MCV 83.4 03/30/2020   PLT 191.0 03/30/2020   Attestation Statements:   Reviewed by clinician on day of visit: allergies, medications, problem list, medical history, surgical  history, family history, social history, and previous encounter notes.  I, Water quality scientist, CMA, am acting as transcriptionist for Briscoe Deutscher, DO  I have reviewed the above documentation for accuracy and completeness, and I agree with the above. Briscoe Deutscher, DO

## 2021-01-23 ENCOUNTER — Other Ambulatory Visit (HOSPITAL_COMMUNITY): Payer: Self-pay

## 2021-01-23 MED ORDER — METHYLPHENIDATE HCL ER (CD) 30 MG PO CPCR
ORAL_CAPSULE | ORAL | 0 refills | Status: DC
  Filled 2021-01-23: qty 30, 30d supply, fill #0

## 2021-01-24 ENCOUNTER — Ambulatory Visit (INDEPENDENT_AMBULATORY_CARE_PROVIDER_SITE_OTHER): Payer: 59 | Admitting: Family Medicine

## 2021-01-24 ENCOUNTER — Other Ambulatory Visit: Payer: Self-pay

## 2021-01-24 ENCOUNTER — Encounter (INDEPENDENT_AMBULATORY_CARE_PROVIDER_SITE_OTHER): Payer: Self-pay | Admitting: Family Medicine

## 2021-01-24 VITALS — BP 127/74 | HR 74 | Temp 98.1°F | Ht 72.0 in | Wt 252.0 lb

## 2021-01-24 DIAGNOSIS — K760 Fatty (change of) liver, not elsewhere classified: Secondary | ICD-10-CM

## 2021-01-24 DIAGNOSIS — E88819 Insulin resistance, unspecified: Secondary | ICD-10-CM

## 2021-01-24 DIAGNOSIS — Z6835 Body mass index (BMI) 35.0-35.9, adult: Secondary | ICD-10-CM

## 2021-01-24 DIAGNOSIS — M1A0790 Idiopathic chronic gout, unspecified ankle and foot, without tophus (tophi): Secondary | ICD-10-CM | POA: Diagnosis not present

## 2021-01-24 DIAGNOSIS — E8881 Metabolic syndrome: Secondary | ICD-10-CM

## 2021-01-24 DIAGNOSIS — E7800 Pure hypercholesterolemia, unspecified: Secondary | ICD-10-CM | POA: Diagnosis not present

## 2021-01-24 DIAGNOSIS — E66812 Obesity, class 2: Secondary | ICD-10-CM

## 2021-01-24 NOTE — Progress Notes (Signed)
Chief Complaint:   OBESITY Andrew Gray is here to discuss his progress with his obesity treatment plan along with follow-up of his obesity related diagnoses.   Today's visit was #: 11 Starting weight: 260 lbs Starting date: 05/19/2020 Today's weight: 252 lbs Today's date: 01/24/2021 Weight change since last visit: 2 lbs Total lbs lost to date: 8 lbs Body mass index is 34.18 kg/m.  Total weight loss percentage to date: -3.08%  Current Meal Plan: keeping a food journal and adhering to recommended goals of 2000-2500 calories and 125 grams of protein for 90% of the time.  Current Exercise Plan: Walking/cardio for 30-60 minutes 5 times per week. Current Anti-Obesity Medications: Mounjaro 5 mg subcutaneously weekly. Side effects: None.  Interim History:  Andrew Gray is doing well.  He endorses mild constipation.  Assessment/Plan:   1. Insulin resistance, with polyphagia Not at goal. Goal is HgbA1c < 5.7, fasting insulin closer to 5.  Medication: Mounjaro 5 mg subcutaneously weekly.    Plan:  He will continue to focus on protein-rich, low simple carbohydrate foods. We reviewed the importance of hydration, regular exercise for stress reduction, and restorative sleep.  Will check labs today, as per below.  Continue Mounjaro.  Lab Results  Component Value Date   HGBA1C 4.4 Repeated and verified X2. (L) 03/30/2020   Lab Results  Component Value Date   INSULIN 17.7 03/30/2020   - Comprehensive metabolic panel; Future - Insulin, random; Future  2. Pure hypercholesterolemia Course: Not optimized. Lipid-lowering medications: None.   Plan: Dietary changes: Increase soluble fiber, decrease simple carbohydrates, decrease saturated fat. Exercise changes: Moderate to vigorous-intensity aerobic activity 150 minutes per week or as tolerated. We will continue to monitor along with PCP/specialists as it pertains to his weight loss journey.  Will check lipid panel today.  Lab Results  Component Value  Date   CHOL 199 08/25/2020   HDL 43 08/25/2020   LDLCALC 141 (H) 08/25/2020   TRIG 82 08/25/2020   CHOLHDL 4.6 08/25/2020   Lab Results  Component Value Date   ALT 48 (H) 08/25/2020   AST 35 08/25/2020   ALKPHOS 98 08/25/2020   BILITOT 0.7 08/25/2020   The 10-year ASCVD risk score (Arnett DK, et al., 2019) is: 1.6%   Values used to calculate the score:     Age: 40 years     Sex: Male     Is Non-Hispanic African American: No     Diabetic: No     Tobacco smoker: No     Systolic Blood Pressure: 921 mmHg     Is BP treated: Yes     HDL Cholesterol: 43 mg/dL     Total Cholesterol: 199 mg/dL  - Lipid Panel With LDL/HDL Ratio; Future  3. Hepatic steatosis Will check labs today.  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    Plan:  Check uric acid level today.  - Uric acid; Future  5. Obesity with current BMI of 34.2  Course: Andrew Gray is currently in the action stage of change. As such, his goal is to continue with weight loss efforts.   Nutrition goals: He has agreed to keeping a food journal and adhering to recommended goals of 2000-2500 calories and 125 grams of protein.   Exercise goals:  As is.  Behavioral modification strategies: increasing lean protein intake, decreasing simple carbohydrates, and increasing vegetables.  Andrew Gray has agreed to follow-up with our  clinic in 2 weeks. He was informed of the importance of frequent follow-up visits to maximize his success with intensive lifestyle modifications for his multiple health conditions.   Andrew Gray was informed we would discuss his lab results at his next visit unless there is a critical issue that needs to be addressed sooner. Andrew Gray agreed to keep his next visit at the agreed upon time to discuss these results.  Objective:   Blood pressure 127/74, pulse 74, temperature 98.1 F (36.7 C), temperature source Oral, height 6' (1.829 m),  weight 252 lb (114.3 kg), SpO2 98 %. Body mass index is 34.18 kg/m.  General: Cooperative, alert, well developed, in no acute distress. HEENT: Conjunctivae and lids unremarkable. Cardiovascular: Regular rhythm.  Lungs: Normal work of breathing. Neurologic: No focal deficits.   Lab Results  Component Value Date   CREATININE 1.03 08/25/2020   BUN 21 08/25/2020   NA 143 08/25/2020   K 3.9 08/25/2020   CL 103 08/25/2020   CO2 22 08/25/2020   Lab Results  Component Value Date   ALT 48 (H) 08/25/2020   AST 35 08/25/2020   ALKPHOS 98 08/25/2020   BILITOT 0.7 08/25/2020   Lab Results  Component Value Date   HGBA1C 4.4 Repeated and verified X2. (L) 03/30/2020   HGBA1C 4.6 08/26/2018   HGBA1C 4.7 07/12/2015   HGBA1C 4.9 02/22/2015   Lab Results  Component Value Date   INSULIN 17.7 03/30/2020   Lab Results  Component Value Date   TSH 1.32 03/30/2020   Lab Results  Component Value Date   CHOL 199 08/25/2020   HDL 43 08/25/2020   LDLCALC 141 (H) 08/25/2020   TRIG 82 08/25/2020   CHOLHDL 4.6 08/25/2020   Lab Results  Component Value Date   VD25OH 40 08/26/2018   VD25OH 37 08/07/2016   VD25OH 57 07/12/2015   Lab Results  Component Value Date   WBC 4.1 03/30/2020   HGB 14.7 03/30/2020   HCT 41.7 03/30/2020   MCV 83.4 03/30/2020   PLT 191.0 03/30/2020   Attestation Statements:   Reviewed by clinician on day of visit: allergies, medications, problem list, medical history, surgical history, family history, social history, and previous encounter notes.  I, Water quality scientist, CMA, am acting as transcriptionist for Briscoe Deutscher, DO  I have reviewed the above documentation for accuracy and completeness, and I agree with the above. Briscoe Deutscher, DO

## 2021-02-01 ENCOUNTER — Encounter: Payer: Self-pay | Admitting: Internal Medicine

## 2021-02-09 ENCOUNTER — Other Ambulatory Visit: Payer: Self-pay

## 2021-02-09 ENCOUNTER — Encounter (INDEPENDENT_AMBULATORY_CARE_PROVIDER_SITE_OTHER): Payer: Self-pay | Admitting: Physician Assistant

## 2021-02-09 ENCOUNTER — Ambulatory Visit (INDEPENDENT_AMBULATORY_CARE_PROVIDER_SITE_OTHER): Payer: 59 | Admitting: Physician Assistant

## 2021-02-09 ENCOUNTER — Other Ambulatory Visit (HOSPITAL_COMMUNITY): Payer: Self-pay

## 2021-02-09 VITALS — BP 116/68 | HR 62 | Temp 97.9°F | Ht 72.0 in | Wt 247.0 lb

## 2021-02-09 DIAGNOSIS — E8881 Metabolic syndrome: Secondary | ICD-10-CM | POA: Diagnosis not present

## 2021-02-09 DIAGNOSIS — Z6835 Body mass index (BMI) 35.0-35.9, adult: Secondary | ICD-10-CM | POA: Diagnosis not present

## 2021-02-09 DIAGNOSIS — Z9189 Other specified personal risk factors, not elsewhere classified: Secondary | ICD-10-CM

## 2021-02-09 MED ORDER — TIRZEPATIDE 5 MG/0.5ML ~~LOC~~ SOAJ
5.0000 mg | SUBCUTANEOUS | 0 refills | Status: DC
  Filled 2021-02-09: qty 2, 28d supply, fill #0

## 2021-02-09 NOTE — Progress Notes (Signed)
Chief Complaint:   OBESITY Andrew Gray is here to discuss his progress with his obesity treatment plan along with follow-up of his obesity related diagnoses. Andrew Gray is on keeping a food journal and adhering to recommended goals of 2000-2500 calories and 125 grams of protein and states he is following his eating plan approximately 95% of the time. Andrew Gray states he is walking for 30 minutes 5 times per week and cardio for 60 minutes 5 times per week.  Today's visit was #: 12 Starting weight: 260 lbs Starting date: 05/19/2020 Today's weight: 247 lbs Today's date: 02/09/2021 Total lbs lost to date: 13 lbs Total lbs lost since last in-office visit: 5 lbs  Interim History: Andrew Gray did very well with weight loss. He notes eating at least 60 grams of protein for breakfast and often using protein shakes and bars for lunch. Andrew Gray states his weekends tend to be the biggest struggle due to "grazing" and availability of food. He continues to have some night time cravings as well.   Subjective:   1. Insulin resistance, with polyphagia Andrew Gray stated Andrew Gray will work well to decrease appetite. He does not want to increase dose at this time. His last Insulin was 17.  2. At risk for side effect of medication Andrew Gray is at risk for side effect of medication due to diarrhea with Mounjaro.  Assessment/Plan:   1. Insulin resistance, with polyphagia Andrew Gray will continue to work on weight loss, exercise, and decreasing simple carbohydrates to help decrease the risk of diabetes. We will refill Mounjaro for 1 month with no refills. Andrew Gray agreed to follow-up with [place] as directed to closely monitor his progress.  - tirzepatide [employer]) 5 MG/0.5ML Pen; Inject 5 mg into the skin once a week.  Dispense: 2 mL; Refill: 0  2. At risk for side effect of medication Andrew Gray was given approximately 15 minutes of drug side effect counseling today.  We discussed side effect possibility and risk versus benefits. Andrew Gray agreed to the medication  and will contact this office if these side effects are intolerable.  Repetitive spaced learning was employed today to elicit superior memory formation and behavioral change.   3. Obesity with current BMI of 33.49 Andrew Gray is currently in the action stage of change. As such, his goal is to continue with weight loss efforts. He has agreed to keeping a food journal and adhering to recommended goals of 2000-2500 calories and 125 grams of protein daily.  Exercise goals:  As is.  Behavioral modification strategies: increasing lean protein intake and planning for success.  Andrew Gray has agreed to follow-up with our clinic in 2 weeks. He was informed of the importance of frequent follow-up visits to maximize his success with intensive lifestyle modifications for his multiple health conditions.   Objective:   Blood pressure 116/68, pulse 62, temperature 97.9 F (36.6 C), height 6' (1.829 m), weight 247 lb (112 kg), SpO2 96 %. Body mass index is 33.5 kg/m.  General: Cooperative, alert, well developed, in no acute distress. HEENT: Conjunctivae and lids unremarkable. Cardiovascular: Regular rhythm.  Lungs: Normal work of breathing. Neurologic: No focal deficits.   Lab Results  Component Value Date   CREATININE 1.03 08/25/2020   BUN 21 08/25/2020   NA 143 08/25/2020   K 3.9 08/25/2020   CL 103 08/25/2020   CO2 22 08/25/2020   Lab Results  Component Value Date   ALT 48 (H) 08/25/2020   AST 35 08/25/2020   ALKPHOS 98 08/25/2020   BILITOT 0.7  08/25/2020   Lab Results  Component Value Date   HGBA1C 4.4 Repeated and verified X2. (L) 03/30/2020   HGBA1C 4.6 08/26/2018   HGBA1C 4.7 07/12/2015   HGBA1C 4.9 02/22/2015   Lab Results  Component Value Date   INSULIN 17.7 03/30/2020   Lab Results  Component Value Date   TSH 1.32 03/30/2020   Lab Results  Component Value Date   CHOL 199 08/25/2020   HDL 43 08/25/2020   LDLCALC 141 (H) 08/25/2020   TRIG 82 08/25/2020   CHOLHDL 4.6  08/25/2020   Lab Results  Component Value Date   VD25OH 40 08/26/2018   VD25OH 37 08/07/2016   VD25OH 57 07/12/2015   Lab Results  Component Value Date   WBC 4.1 03/30/2020   HGB 14.7 03/30/2020   HCT 41.7 03/30/2020   MCV 83.4 03/30/2020   PLT 191.0 03/30/2020   No results found for: IRON, TIBC, FERRITIN  Attestation Statements:   Reviewed by clinician on day of visit: allergies, medications, problem list, medical history, surgical history, family history, social history, and previous encounter notes.   I, Tonye Pearson, am acting as Location manager for [employer], PA-C.   I have reviewed the above documentation for accuracy and completeness, and I agree with the above. Abby Potash, PA-C

## 2021-02-10 ENCOUNTER — Other Ambulatory Visit (HOSPITAL_COMMUNITY): Payer: Self-pay

## 2021-02-10 DIAGNOSIS — F902 Attention-deficit hyperactivity disorder, combined type: Secondary | ICD-10-CM | POA: Diagnosis not present

## 2021-02-10 DIAGNOSIS — Z79899 Other long term (current) drug therapy: Secondary | ICD-10-CM | POA: Diagnosis not present

## 2021-02-10 MED ORDER — TRAZODONE HCL 50 MG PO TABS
ORAL_TABLET | ORAL | 0 refills | Status: DC
  Filled 2021-02-10: qty 60, 30d supply, fill #0

## 2021-02-12 MED FILL — Febuxostat Tab 80 MG: ORAL | 90 days supply | Qty: 90 | Fill #1 | Status: AC

## 2021-02-13 ENCOUNTER — Other Ambulatory Visit (HOSPITAL_COMMUNITY): Payer: Self-pay

## 2021-02-14 ENCOUNTER — Other Ambulatory Visit (HOSPITAL_COMMUNITY): Payer: Self-pay

## 2021-02-15 ENCOUNTER — Other Ambulatory Visit (HOSPITAL_COMMUNITY): Payer: Self-pay

## 2021-02-17 DIAGNOSIS — Z0100 Encounter for examination of eyes and vision without abnormal findings: Secondary | ICD-10-CM | POA: Diagnosis not present

## 2021-02-20 ENCOUNTER — Encounter: Payer: Self-pay | Admitting: Internal Medicine

## 2021-02-23 ENCOUNTER — Other Ambulatory Visit: Payer: Self-pay

## 2021-02-23 ENCOUNTER — Ambulatory Visit (INDEPENDENT_AMBULATORY_CARE_PROVIDER_SITE_OTHER): Payer: 59 | Admitting: Family Medicine

## 2021-02-23 ENCOUNTER — Encounter (INDEPENDENT_AMBULATORY_CARE_PROVIDER_SITE_OTHER): Payer: Self-pay | Admitting: Family Medicine

## 2021-02-23 VITALS — BP 121/70 | HR 67 | Temp 98.2°F | Ht 72.0 in | Wt 246.0 lb

## 2021-02-23 DIAGNOSIS — E8881 Metabolic syndrome: Secondary | ICD-10-CM | POA: Diagnosis not present

## 2021-02-23 DIAGNOSIS — Z6835 Body mass index (BMI) 35.0-35.9, adult: Secondary | ICD-10-CM

## 2021-02-23 DIAGNOSIS — K760 Fatty (change of) liver, not elsewhere classified: Secondary | ICD-10-CM

## 2021-02-23 DIAGNOSIS — E88819 Insulin resistance, unspecified: Secondary | ICD-10-CM

## 2021-02-27 NOTE — Progress Notes (Signed)
Chief Complaint:   OBESITY Andrew Gray is here to discuss his progress with his obesity treatment plan along with follow-up of his obesity related diagnoses. See Medical Weight Management Flowsheet for complete bioelectrical impedance results.  Today's visit was #: 4 Starting weight: 260 lbs Starting date: 05/19/2020 Weight change since last visit: 1 lb Total lbs lost to date: 14 lbs Total weight loss percentage to date: -5.38%  Nutrition Plan: Keeping a food journal and adhering to recommended goals of 2000-2500 calories and 125 grams of protein daily for 90-95% of the time. Activity: Walking/cardio for 20-30+ minutes 5 times per week.  Anti-obesity medications: Mounjaro 5 mg subcutaneously weekly. Reported side effects: None.  Interim History: Andrew Gray says he is down a pant size.  He is happy with his progress.  Assessment/Plan:   1. Insulin resistance, with polyphagia Not at goal. Goal is HgbA1c < 5.7, fasting insulin closer to 5.  Medication: Mounjaro 5 mg subcutaneously weekly.    Plan:  Continue Mounjaro 5 mg subcutaneously weekly.  He will continue to focus on protein-rich, low simple carbohydrate foods. We reviewed the importance of hydration, regular exercise for stress reduction, and restorative sleep.   Lab Results  Component Value Date   INSULIN 17.7 03/30/2020   2. Hepatic steatosis The mainstay of treatment of NAFLD includes lifestyle modification to achieve weight loss, at least 7% of current body weight. Low carbohydrate diets can be beneficial in improving NAFLD liver histology. Additionally, exercise, even the absence of weight loss can have beneficial effects on the patient's metabolic profile and liver health.  3. Obesity with current BMI of 33.4  Course: Andrew Gray is currently in the action stage of change. As such, his goal is to continue with weight loss efforts.   Nutrition goals: He has agreed to keeping a food journal and adhering to recommended goals of  2000-2500 calories and 125 grams of protein.   Exercise goals:  As is.  Behavioral modification strategies: increasing lean protein intake and decreasing simple carbohydrates.  Andrew Gray has agreed to follow-up with our clinic in 2 weeks. He was informed of the importance of frequent follow-up visits to maximize his success with intensive lifestyle modifications for his multiple health conditions.   Objective:   Blood pressure 121/70, pulse 67, temperature 98.2 F (36.8 C), temperature source Oral, height 6' (1.829 m), weight 246 lb (111.6 kg), SpO2 99 %. Body mass index is 33.36 kg/m.  General: Cooperative, alert, well developed, in no acute distress. HEENT: Conjunctivae and lids unremarkable. Cardiovascular: Regular rhythm.  Lungs: Normal work of breathing. Neurologic: No focal deficits.   Lab Results  Component Value Date   CREATININE 1.03 08/25/2020   BUN 21 08/25/2020   NA 143 08/25/2020   K 3.9 08/25/2020   CL 103 08/25/2020   CO2 22 08/25/2020   Lab Results  Component Value Date   ALT 48 (H) 08/25/2020   AST 35 08/25/2020   ALKPHOS 98 08/25/2020   BILITOT 0.7 08/25/2020   Lab Results  Component Value Date   HGBA1C 4.4 Repeated and verified X2. (L) 03/30/2020   HGBA1C 4.6 08/26/2018   HGBA1C 4.7 07/12/2015   HGBA1C 4.9 02/22/2015   Lab Results  Component Value Date   INSULIN 17.7 03/30/2020   Lab Results  Component Value Date   TSH 1.32 03/30/2020   Lab Results  Component Value Date   CHOL 199 08/25/2020   HDL 43 08/25/2020   LDLCALC 141 (H) 08/25/2020   TRIG 82  08/25/2020   CHOLHDL 4.6 08/25/2020   Lab Results  Component Value Date   VD25OH 40 08/26/2018   VD25OH 37 08/07/2016   VD25OH 57 07/12/2015   Lab Results  Component Value Date   WBC 4.1 03/30/2020   HGB 14.7 03/30/2020   HCT 41.7 03/30/2020   MCV 83.4 03/30/2020   PLT 191.0 03/30/2020   Attestation Statements:   Reviewed by clinician on day of visit: allergies, medications, problem  list, medical history, surgical history, family history, social history, and previous encounter notes.  I, Water quality scientist, CMA, am acting as transcriptionist for Briscoe Deutscher, DO  I have reviewed the above documentation for accuracy and completeness, and I agree with the above. -  Briscoe Deutscher, DO, MS, FAAFP, DABOM - Family and Bariatric Medicine.

## 2021-03-03 ENCOUNTER — Other Ambulatory Visit (HOSPITAL_COMMUNITY): Payer: Self-pay

## 2021-03-03 MED ORDER — METHYLPHENIDATE HCL ER (CD) 30 MG PO CPCR
ORAL_CAPSULE | ORAL | 0 refills | Status: DC
  Filled 2021-03-03: qty 30, 30d supply, fill #0

## 2021-03-09 ENCOUNTER — Other Ambulatory Visit (HOSPITAL_COMMUNITY): Payer: Self-pay

## 2021-03-09 ENCOUNTER — Encounter (INDEPENDENT_AMBULATORY_CARE_PROVIDER_SITE_OTHER): Payer: Self-pay | Admitting: Family Medicine

## 2021-03-09 ENCOUNTER — Other Ambulatory Visit: Payer: Self-pay

## 2021-03-09 ENCOUNTER — Ambulatory Visit (INDEPENDENT_AMBULATORY_CARE_PROVIDER_SITE_OTHER): Payer: 59 | Admitting: Family Medicine

## 2021-03-09 VITALS — BP 128/75 | HR 61 | Temp 97.9°F | Ht 72.0 in | Wt 243.0 lb

## 2021-03-09 DIAGNOSIS — E8881 Metabolic syndrome: Secondary | ICD-10-CM

## 2021-03-09 DIAGNOSIS — E66812 Obesity, class 2: Secondary | ICD-10-CM

## 2021-03-09 DIAGNOSIS — E88819 Insulin resistance, unspecified: Secondary | ICD-10-CM

## 2021-03-09 DIAGNOSIS — Z6835 Body mass index (BMI) 35.0-35.9, adult: Secondary | ICD-10-CM | POA: Diagnosis not present

## 2021-03-09 MED ORDER — TIRZEPATIDE 5 MG/0.5ML ~~LOC~~ SOAJ
5.0000 mg | SUBCUTANEOUS | 0 refills | Status: DC
  Filled 2021-03-09: qty 6, 84d supply, fill #0

## 2021-03-09 NOTE — Progress Notes (Signed)
Chief Complaint:   OBESITY Andrew Gray is here to discuss his progress with his obesity treatment plan along with follow-up of his obesity related diagnoses. See Medical Weight Management Flowsheet for complete bioelectrical impedance results.  Today's visit was #: 14 Starting weight: 260 lbs Starting date: 05/19/2020 Weight change since last visit: 3 lbs Total lbs lost to date: 17 lbs Total weight loss percentage to date: -6.54%  Nutrition Plan: Keeping a food journal and adhering to recommended goals of 2000-2500 calories and 125 grams of protein daily for 90-95% of the time. Activity: Walking/cardio for 30-60 minutes 5 times per week.  Anti-obesity medications: Mounjaro 5 mg subcutaneously weekly. Reported side effects: None.  Assessment/Plan:   1. Insulin resistance, with polyphagia Not at goal. Goal is HgbA1c < 5.7, fasting insulin closer to 5.  Medication: Mounjaro 5 mg subcutaneously weekly.    Plan:  Continue Mounjaro 5 mg subcutaneously weekly.  Will refill today, as per below.  He will continue to focus on protein-rich, low simple carbohydrate foods. We reviewed the importance of hydration, regular exercise for stress reduction, and restorative sleep.   Lab Results  Component Value Date   HGBA1C 4.4 Repeated and verified X2. (L) 03/30/2020   Lab Results  Component Value Date   INSULIN 17.7 03/30/2020   - Refill tirzepatide (MOUNJARO) 5 MG/0.5ML Pen; Inject 5 mg (1 pen) into the skin once a week.  Dispense: 6 mL; Refill: 0  2. Class 2 severe obesity with serious comorbidity and body mass index (BMI) of 35.0 to 35.9 in adult, unspecified obesity type ([place])  Course: Andrew Gray is currently in the action stage of change. As such, his goal is to continue with weight loss efforts.   Nutrition goals: He has agreed to keeping a food journal and adhering to recommended goals of 2000-2500 calories and 125 grams of protein.   Exercise goals:  As is.  Behavioral modification  strategies: increasing lean protein intake, decreasing simple carbohydrates, increasing vegetables, and increasing water intake.  Andrew Gray has agreed to follow-up with our clinic in 4 weeks. He was informed of the importance of frequent follow-up visits to maximize his success with intensive lifestyle modifications for his multiple health conditions.   Objective:   Blood pressure 128/75, pulse 61, temperature 97.9 F (36.6 C), temperature source Oral, height 6' (1.829 m), weight 243 lb (110.2 kg), SpO2 98 %. Body mass index is 32.96 kg/m.  General: Cooperative, alert, well developed, in no acute distress. HEENT: Conjunctivae and lids unremarkable. Cardiovascular: Regular rhythm.  Lungs: Normal work of breathing. Neurologic: No focal deficits.   Lab Results  Component Value Date   CREATININE 1.03 08/25/2020   BUN 21 08/25/2020   NA 143 08/25/2020   K 3.9 08/25/2020   CL 103 08/25/2020   CO2 22 08/25/2020   Lab Results  Component Value Date   ALT 48 (H) 08/25/2020   AST 35 08/25/2020   ALKPHOS 98 08/25/2020   BILITOT 0.7 08/25/2020   Lab Results  Component Value Date   HGBA1C 4.4 Repeated and verified X2. (L) 03/30/2020   HGBA1C 4.6 08/26/2018   HGBA1C 4.7 07/12/2015   HGBA1C 4.9 02/22/2015   Lab Results  Component Value Date   INSULIN 17.7 03/30/2020   Lab Results  Component Value Date   TSH 1.32 03/30/2020   Lab Results  Component Value Date   CHOL 199 08/25/2020   HDL 43 08/25/2020   LDLCALC 141 (H) 08/25/2020   TRIG 82 08/25/2020  CHOLHDL 4.6 08/25/2020   Lab Results  Component Value Date   VD25OH 40 08/26/2018   VD25OH 37 08/07/2016   VD25OH 57 07/12/2015   Lab Results  Component Value Date   WBC 4.1 03/30/2020   HGB 14.7 03/30/2020   HCT 41.7 03/30/2020   MCV 83.4 03/30/2020   PLT 191.0 03/30/2020   Attestation Statements:   Reviewed by clinician on day of visit: allergies, medications, problem list, medical history, surgical history, family  history, social history, and previous encounter notes.  I, Water quality scientist, CMA, am acting as transcriptionist for Briscoe Deutscher, DO  I have reviewed the above documentation for accuracy and completeness, and I agree with the above. -  Briscoe Deutscher, DO, MS, FAAFP, DABOM - Family and Bariatric Medicine.

## 2021-03-13 ENCOUNTER — Other Ambulatory Visit (HOSPITAL_COMMUNITY): Payer: Self-pay

## 2021-03-14 ENCOUNTER — Other Ambulatory Visit (HOSPITAL_COMMUNITY): Payer: Self-pay

## 2021-03-23 ENCOUNTER — Ambulatory Visit (INDEPENDENT_AMBULATORY_CARE_PROVIDER_SITE_OTHER): Payer: 59 | Admitting: Family Medicine

## 2021-03-26 ENCOUNTER — Other Ambulatory Visit (HOSPITAL_COMMUNITY): Payer: Self-pay

## 2021-03-27 ENCOUNTER — Other Ambulatory Visit (HOSPITAL_COMMUNITY): Payer: Self-pay

## 2021-04-03 DIAGNOSIS — G4733 Obstructive sleep apnea (adult) (pediatric): Secondary | ICD-10-CM | POA: Diagnosis not present

## 2021-04-08 ENCOUNTER — Other Ambulatory Visit (HOSPITAL_COMMUNITY): Payer: Self-pay

## 2021-04-10 ENCOUNTER — Other Ambulatory Visit (HOSPITAL_COMMUNITY): Payer: Self-pay

## 2021-04-11 ENCOUNTER — Encounter (INDEPENDENT_AMBULATORY_CARE_PROVIDER_SITE_OTHER): Payer: Self-pay | Admitting: Family Medicine

## 2021-04-11 ENCOUNTER — Other Ambulatory Visit: Payer: Self-pay

## 2021-04-11 ENCOUNTER — Ambulatory Visit (INDEPENDENT_AMBULATORY_CARE_PROVIDER_SITE_OTHER): Payer: 59 | Admitting: Family Medicine

## 2021-04-11 VITALS — BP 114/69 | HR 72 | Temp 97.7°F | Ht 72.0 in | Wt 238.0 lb

## 2021-04-11 DIAGNOSIS — E88819 Insulin resistance, unspecified: Secondary | ICD-10-CM

## 2021-04-11 DIAGNOSIS — E8881 Metabolic syndrome: Secondary | ICD-10-CM | POA: Diagnosis not present

## 2021-04-11 DIAGNOSIS — Z724 Inappropriate diet and eating habits: Secondary | ICD-10-CM

## 2021-04-11 DIAGNOSIS — K760 Fatty (change of) liver, not elsewhere classified: Secondary | ICD-10-CM | POA: Diagnosis not present

## 2021-04-11 DIAGNOSIS — Z6835 Body mass index (BMI) 35.0-35.9, adult: Secondary | ICD-10-CM | POA: Diagnosis not present

## 2021-04-11 NOTE — Progress Notes (Signed)
Chief Complaint:   OBESITY Andrew Gray is here to discuss his progress with his obesity treatment plan along with follow-up of his obesity related diagnoses. See Medical Weight Management Flowsheet for complete bioelectrical impedance results.  Today's visit was #: 15 Starting weight: 260 lbs Starting date: 05/19/2020 Weight change since last visit: 5 lbs Total lbs lost to date: 22 lbs Total weight loss percentage to date: -8.46%  Nutrition Plan: Keeping a food journal and adhering to recommended goals of 2000-2500 calories and 125 grams of grams of protein daily for 90+% of the time. Activity: Walking/cardio for 30 minutes 3 times per week. Anti-obesity medications: Mounjaro 5 mg subcutaneously weekly. Reported side effects: None.  Interim History: Andrew Gray is doing well.  He had some mild diarrhea for 2 days after injection.  He says he is going to [place] for Christmas.    Assessment/Plan:   1. Insulin resistance, with polyphagia Not at goal. Goal is HgbA1c < 5.7, fasting insulin closer to 5.  Medication: Mounjaro 5 mg subcutaneously weekly.    Plan:  Continue Mounjaro 5 mg subcutaneously weekly.  He will continue to focus on protein-rich, low simple carbohydrate foods. We reviewed the importance of hydration, regular exercise for stress reduction, and restorative sleep.   Lab Results  Component Value Date   HGBA1C 4.4 Repeated and verified X2. (L) 03/30/2020   Lab Results  Component Value Date   INSULIN 17.7 03/30/2020   2. Hepatic steatosis Intensive lifestyle modifications are the first line treatment for this issue. We discussed several lifestyle modifications today and he will continue to work on diet, exercise and weight loss efforts.   3. 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  Counseling Night Eating Syndrome is  characterized by eating after awakening from sleep or by excessive food consumption (usually > 25% of daily calories) after the evening meal. It is "relapsing and remitting," meaning that it comes and goes. It usually worsens during stressful times in your life. The person is aware and recalls the eating. Night Eating Syndrome usually starts in early adulthood. Men and women are both affected, but women tend to have more severe symptoms.  Interesting statistics: The prevalence of NES is 1.5% of the general population, but found in 8.9% of patients that enroll in an obesity medicine program. Approximately 15-20% ALSO have Binge Eating Disorder.  Treatment for Night Eating Syndrome include: CBT, SSRIs, Topiramate, Progressive Muscle Relaxation, Phototherapy, and Normalized Eating.  4. Obesity, current BMI 32.4  Course: Andrew Gray is currently in the action stage of change. As such, his goal is to continue with weight loss efforts.   Nutrition goals: He has agreed to keeping a food journal and adhering to recommended goals of 2000-2500 calories and 125 grams of protein.   Exercise goals:  As is.  Behavioral modification strategies: increasing lean protein intake, decreasing simple carbohydrates, increasing vegetables, and increasing water intake.  Andrew Gray has agreed to follow-up with our clinic in 4 weeks. He was informed of the importance of frequent follow-up visits to maximize his success with intensive lifestyle modifications for his multiple health conditions.   Objective:   Blood pressure 114/69, pulse 72, temperature 97.7 F (36.5 C), temperature source Oral, height 6' (1.829 m), weight 238 lb (108 kg), SpO2 99 %. Body mass index is 32.28 kg/m.  General: Cooperative, alert, well developed, in no acute distress.  HEENT: Conjunctivae and lids unremarkable. Cardiovascular: Regular rhythm.  Lungs: Normal work of breathing. Neurologic: No focal deficits.   Lab Results  Component Value Date    CREATININE 1.03 08/25/2020   BUN 21 08/25/2020   NA 143 08/25/2020   K 3.9 08/25/2020   CL 103 08/25/2020   CO2 22 08/25/2020   Lab Results  Component Value Date   ALT 48 (H) 08/25/2020   AST 35 08/25/2020   ALKPHOS 98 08/25/2020   BILITOT 0.7 08/25/2020   Lab Results  Component Value Date   HGBA1C 4.4 Repeated and verified X2. (L) 03/30/2020   HGBA1C 4.6 08/26/2018   HGBA1C 4.7 07/12/2015   HGBA1C 4.9 02/22/2015   Lab Results  Component Value Date   INSULIN 17.7 03/30/2020   Lab Results  Component Value Date   TSH 1.32 03/30/2020   Lab Results  Component Value Date   CHOL 199 08/25/2020   HDL 43 08/25/2020   LDLCALC 141 (H) 08/25/2020   TRIG 82 08/25/2020   CHOLHDL 4.6 08/25/2020   Lab Results  Component Value Date   VD25OH 40 08/26/2018   VD25OH 37 08/07/2016   VD25OH 57 07/12/2015   Lab Results  Component Value Date   WBC 4.1 03/30/2020   HGB 14.7 03/30/2020   HCT 41.7 03/30/2020   MCV 83.4 03/30/2020   PLT 191.0 03/30/2020   Attestation Statements:   Reviewed by clinician on day of visit: allergies, medications, problem list, medical history, surgical history, family history, social history, and previous encounter notes.  I, Water quality scientist, CMA, am acting as transcriptionist for Briscoe Deutscher, DO  I have reviewed the above documentation for accuracy and completeness, and I agree with the above. -  Briscoe Deutscher, DO, MS, FAAFP, DABOM - Family and Bariatric Medicine.

## 2021-04-20 ENCOUNTER — Other Ambulatory Visit: Payer: Self-pay | Admitting: Family Medicine

## 2021-04-20 ENCOUNTER — Other Ambulatory Visit: Payer: 59

## 2021-04-20 ENCOUNTER — Other Ambulatory Visit (HOSPITAL_COMMUNITY): Payer: Self-pay

## 2021-04-20 ENCOUNTER — Other Ambulatory Visit: Payer: Self-pay

## 2021-04-20 DIAGNOSIS — M1A0790 Idiopathic chronic gout, unspecified ankle and foot, without tophus (tophi): Secondary | ICD-10-CM

## 2021-04-20 DIAGNOSIS — K760 Fatty (change of) liver, not elsewhere classified: Secondary | ICD-10-CM

## 2021-04-20 DIAGNOSIS — E7800 Pure hypercholesterolemia, unspecified: Secondary | ICD-10-CM | POA: Diagnosis not present

## 2021-04-20 DIAGNOSIS — E8881 Metabolic syndrome: Secondary | ICD-10-CM

## 2021-04-20 MED ORDER — DOXYCYCLINE HYCLATE 100 MG PO TABS
100.0000 mg | ORAL_TABLET | Freq: Two times a day (BID) | ORAL | 0 refills | Status: AC
  Filled 2021-04-20: qty 14, 7d supply, fill #0

## 2021-04-20 NOTE — Progress Notes (Signed)
Folliculitis;  Sent in doxy

## 2021-04-21 LAB — LIPID PANEL WITH LDL/HDL RATIO
Cholesterol, Total: 180 mg/dL (ref 100–199)
HDL: 40 mg/dL (ref >39)
LDL Chol Calc (NIH): 122 mg/dL — ABNORMAL HIGH (ref 0–99)
LDL/HDL Ratio: 3.1 ratio (ref 0.0–3.6)
Triglycerides: 97 mg/dL (ref 0–149)
VLDL Cholesterol Cal: 18 mg/dL (ref 5–40)

## 2021-04-21 LAB — COMPREHENSIVE METABOLIC PANEL
ALT: 39 IU/L (ref 0–44)
AST: 27 IU/L (ref 0–40)
Albumin/Globulin Ratio: 2.1 (ref 1.2–2.2)
Albumin: 5.2 g/dL — ABNORMAL HIGH (ref 4.0–5.0)
Alkaline Phosphatase: 69 IU/L (ref 44–121)
BUN/Creatinine Ratio: 20 (ref 9–20)
BUN: 21 mg/dL (ref 6–24)
Bilirubin Total: 0.9 mg/dL (ref 0.0–1.2)
CO2: 24 mmol/L (ref 20–29)
Calcium: 9.8 mg/dL (ref 8.7–10.2)
Chloride: 99 mmol/L (ref 96–106)
Creatinine, Ser: 1.05 mg/dL (ref 0.76–1.27)
Globulin, Total: 2.5 g/dL (ref 1.5–4.5)
Glucose: 80 mg/dL (ref 70–99)
Potassium: 3.7 mmol/L (ref 3.5–5.2)
Sodium: 139 mmol/L (ref 134–144)
Total Protein: 7.7 g/dL (ref 6.0–8.5)
eGFR: 92 mL/min/{1.73_m2} (ref >59)

## 2021-04-21 LAB — INSULIN, RANDOM: INSULIN: 11.8 u[IU]/mL (ref 2.6–24.9)

## 2021-04-21 LAB — URIC ACID: Uric Acid: 4.9 mg/dL (ref 3.8–8.4)

## 2021-04-28 ENCOUNTER — Other Ambulatory Visit (HOSPITAL_COMMUNITY): Payer: Self-pay

## 2021-04-28 MED ORDER — METHYLPHENIDATE HCL ER (CD) 30 MG PO CPCR
ORAL_CAPSULE | ORAL | 0 refills | Status: DC
Start: 2021-04-28 — End: 2021-06-15
  Filled 2021-04-28: qty 30, 30d supply, fill #0

## 2021-05-09 ENCOUNTER — Other Ambulatory Visit (HOSPITAL_COMMUNITY): Payer: Self-pay

## 2021-05-11 ENCOUNTER — Other Ambulatory Visit: Payer: Self-pay

## 2021-05-11 ENCOUNTER — Ambulatory Visit (INDEPENDENT_AMBULATORY_CARE_PROVIDER_SITE_OTHER): Payer: 59 | Admitting: Family Medicine

## 2021-05-11 ENCOUNTER — Encounter (INDEPENDENT_AMBULATORY_CARE_PROVIDER_SITE_OTHER): Payer: Self-pay | Admitting: Family Medicine

## 2021-05-11 ENCOUNTER — Other Ambulatory Visit (HOSPITAL_COMMUNITY): Payer: Self-pay

## 2021-05-11 VITALS — BP 109/71 | HR 66 | Temp 97.5°F | Ht 72.0 in | Wt 233.0 lb

## 2021-05-11 DIAGNOSIS — Z6835 Body mass index (BMI) 35.0-35.9, adult: Secondary | ICD-10-CM | POA: Diagnosis not present

## 2021-05-11 DIAGNOSIS — E8881 Metabolic syndrome: Secondary | ICD-10-CM

## 2021-05-11 DIAGNOSIS — K760 Fatty (change of) liver, not elsewhere classified: Secondary | ICD-10-CM | POA: Diagnosis not present

## 2021-05-11 DIAGNOSIS — Z724 Inappropriate diet and eating habits: Secondary | ICD-10-CM

## 2021-05-11 DIAGNOSIS — E88819 Insulin resistance, unspecified: Secondary | ICD-10-CM

## 2021-05-11 DIAGNOSIS — E7800 Pure hypercholesterolemia, unspecified: Secondary | ICD-10-CM | POA: Diagnosis not present

## 2021-05-11 MED ORDER — BUPROPION HCL ER (XL) 150 MG PO TB24
ORAL_TABLET | Freq: Every day | ORAL | 2 refills | Status: DC
  Filled 2021-05-11: qty 90, fill #0
  Filled 2021-06-23: qty 90, 90d supply, fill #0
  Filled 2021-09-22: qty 90, 90d supply, fill #1

## 2021-05-11 MED ORDER — TOPIRAMATE ER 50 MG PO CAP24
1.0000 | ORAL_CAPSULE | Freq: Every day | ORAL | 2 refills | Status: DC
  Filled 2021-05-11 – 2021-06-10 (×2): qty 90, 90d supply, fill #0
  Filled 2021-09-08: qty 90, 90d supply, fill #1
  Filled 2021-12-09: qty 90, 90d supply, fill #2

## 2021-05-11 MED ORDER — TIRZEPATIDE 5 MG/0.5ML ~~LOC~~ SOAJ
5.0000 mg | SUBCUTANEOUS | 0 refills | Status: DC
  Filled 2021-05-11 – 2021-06-05 (×2): qty 2, 28d supply, fill #0

## 2021-05-11 NOTE — Progress Notes (Signed)
Chief Complaint:   OBESITY Andrew Gray is here to discuss his progress with his obesity treatment plan along with follow-up of his obesity related diagnoses. See Medical Weight Management Flowsheet for complete bioelectrical impedance results.  Today's visit was #: 1 Starting weight: 260 lbs Starting date: 05/19/2020 Weight change since last visit: 5 lbs Total lbs lost to date: 27 lbs Total weight loss percentage to date: -10.38%  Nutrition Plan: Keeping a food journal and adhering to recommended goals of 2000-2500 calories and 125 grams of protein daily for 80-90% of the time. Activity: Cardio for 20-30 minutes 2-3 times per week.  Anti-obesity medications: Mounjaro 5 mg subcutaneously weekly. Reported side effects: None.  Interim History: Andrew Gray says he had a great vacation in [place].  He is happy with his medications and weight loss.  He still has some diarrhea during the first few days after the injection.  Assessment/Plan:   1. Insulin resistance, with polyphagia Improving, but not optimized. Goal is HgbA1c < 5.7, fasting insulin closer to 5.  Medication: Mounjaro 5 mg subcutaneously weekly.    Plan:  Continue Mounjaro 5 mg subcutaneously weekly. He will continue to focus on protein-rich, low simple carbohydrate foods. We reviewed the importance of hydration, regular exercise for stress reduction, and restorative sleep.   - Refill tirzepatide (MOUNJARO) 5 MG/0.5ML Pen; Inject 5 mg (1 pen) into the skin once a week.  Dispense: 6 mL; Refill: 0  2. Hepatic steatosis Intensive lifestyle modifications are the first line treatment for this issue. We discussed several lifestyle modifications today and he will continue to work on diet, exercise and weight loss efforts.   3. Pure hypercholesterolemia Course: Improving. Lipid-lowering medications: None.   Plan: Dietary changes: Increase soluble fiber, decrease simple carbohydrates, decrease saturated fat. Exercise changes: Moderate to  vigorous-intensity aerobic activity 150 minutes per week or as tolerated.    Lab Results  Component Value Date   CHOL 180 04/20/2021   HDL 40 04/20/2021   LDLCALC 122 (H) 04/20/2021   TRIG 97 04/20/2021   CHOLHDL 4.6 08/25/2020   Lab Results  Component Value Date   ALT 39 04/20/2021   AST 27 04/20/2021   ALKPHOS 69 04/20/2021   BILITOT 0.9 04/20/2021   The 10-year ASCVD risk score (Arnett DK, et al., 2019) is: 1.1%   Values used to calculate the score:     Age: 41 years     Sex: Male     Is Non-Hispanic African American: No     Diabetic: No     Tobacco smoker: No     Systolic Blood Pressure: 852 mmHg     Is BP treated: Yes     HDL Cholesterol: 40 mg/dL     Total Cholesterol: 180 mg/dL  4. Inappropriate diet or eating habits, excess snacking, night-time eating Improved with current treatment.   - Refill Topiramate ER (TROKENDI XR) 50 MG CP24; TAKE 1 CAPSULE BY MOUTH ONCE A DAY  Dispense: 90 capsule; Refill: 2 - Refill buPROPion (WELLBUTRIN XL) 150 MG 24 hr tablet; TAKE 1 TABLET BY MOUTH ONCE A DAY  Dispense: 90 tablet; Refill: 2  5. Obesity, current BMI 31.7  Course: Andrew Gray is currently in the action stage of change. As such, his goal is to continue with weight loss efforts.   Nutrition goals: He has agreed to keeping a food journal and adhering to recommended goals of 2000-2500 calories and 125 grams of protein.   Exercise goals:  As is.  Behavioral modification  strategies: increasing lean protein intake, decreasing simple carbohydrates, increasing vegetables, and increasing water intake.  [name] has agreed to follow-up with our clinic in 4 weeks. He was informed of the importance of frequent follow-up visits to maximize his success with intensive lifestyle modifications for his multiple health conditions.   Objective:   Blood pressure 109/71, pulse 66, temperature (!) 97.5 F (36.4 C), temperature source Oral, height 6' (1.829 m), weight 233 lb (105.7 kg), SpO2 100  %. Body mass index is 31.6 kg/m.  General: Cooperative, alert, well developed, in no acute distress. HEENT: Conjunctivae and lids unremarkable. Cardiovascular: Regular rhythm.  Lungs: Normal work of breathing. Neurologic: No focal deficits.   Lab Results  Component Value Date   CREATININE 1.05 04/20/2021   BUN 21 04/20/2021   NA 139 04/20/2021   K 3.7 04/20/2021   CL 99 04/20/2021   CO2 24 04/20/2021   Lab Results  Component Value Date   ALT 39 04/20/2021   AST 27 04/20/2021   ALKPHOS 69 04/20/2021   BILITOT 0.9 04/20/2021   Lab Results  Component Value Date   HGBA1C 4.4 Repeated and verified X2. (L) 03/30/2020   HGBA1C 4.6 08/26/2018   HGBA1C 4.7 07/12/2015   HGBA1C 4.9 02/22/2015   Lab Results  Component Value Date   INSULIN 11.8 04/20/2021   INSULIN 17.7 03/30/2020   Lab Results  Component Value Date   TSH 1.32 03/30/2020   Lab Results  Component Value Date   CHOL 180 04/20/2021   HDL 40 04/20/2021   LDLCALC 122 (H) 04/20/2021   TRIG 97 04/20/2021   CHOLHDL 4.6 08/25/2020   Lab Results  Component Value Date   VD25OH 40 08/26/2018   VD25OH 37 08/07/2016   VD25OH 57 07/12/2015   Lab Results  Component Value Date   WBC 4.1 03/30/2020   HGB 14.7 03/30/2020   HCT 41.7 03/30/2020   MCV 83.4 03/30/2020   PLT 191.0 03/30/2020   Attestation Statements:   Reviewed by clinician on day of visit: allergies, medications, problem list, medical history, surgical history, family history, social history, and previous encounter notes.  I, Water quality scientist, CMA, am acting as transcriptionist for Briscoe Deutscher, DO  I have reviewed the above documentation for accuracy and completeness, and I agree with the above. -  Briscoe Deutscher, DO, MS, FAAFP, DABOM - Family and Bariatric Medicine.

## 2021-05-12 ENCOUNTER — Other Ambulatory Visit (HOSPITAL_COMMUNITY): Payer: Self-pay

## 2021-05-12 DIAGNOSIS — Z79899 Other long term (current) drug therapy: Secondary | ICD-10-CM | POA: Diagnosis not present

## 2021-05-12 DIAGNOSIS — F902 Attention-deficit hyperactivity disorder, combined type: Secondary | ICD-10-CM | POA: Diagnosis not present

## 2021-05-12 MED ORDER — METHYLPHENIDATE HCL ER (CD) 20 MG PO CPCR
ORAL_CAPSULE | ORAL | 0 refills | Status: DC
  Filled 2021-05-12: qty 30, 30d supply, fill #0

## 2021-05-13 ENCOUNTER — Other Ambulatory Visit (HOSPITAL_COMMUNITY): Payer: Self-pay

## 2021-05-13 MED FILL — Febuxostat Tab 80 MG: ORAL | 90 days supply | Qty: 90 | Fill #2 | Status: AC

## 2021-05-16 ENCOUNTER — Other Ambulatory Visit (HOSPITAL_COMMUNITY): Payer: Self-pay

## 2021-06-06 ENCOUNTER — Other Ambulatory Visit (HOSPITAL_COMMUNITY): Payer: Self-pay

## 2021-06-07 ENCOUNTER — Other Ambulatory Visit (HOSPITAL_COMMUNITY): Payer: Self-pay

## 2021-06-09 ENCOUNTER — Other Ambulatory Visit (HOSPITAL_COMMUNITY): Payer: Self-pay

## 2021-06-10 ENCOUNTER — Other Ambulatory Visit (HOSPITAL_COMMUNITY): Payer: Self-pay

## 2021-06-14 ENCOUNTER — Other Ambulatory Visit (HOSPITAL_COMMUNITY): Payer: Self-pay

## 2021-06-15 ENCOUNTER — Ambulatory Visit (INDEPENDENT_AMBULATORY_CARE_PROVIDER_SITE_OTHER): Payer: 59 | Admitting: Family Medicine

## 2021-06-15 ENCOUNTER — Encounter (INDEPENDENT_AMBULATORY_CARE_PROVIDER_SITE_OTHER): Payer: Self-pay | Admitting: Family Medicine

## 2021-06-15 ENCOUNTER — Other Ambulatory Visit (HOSPITAL_COMMUNITY): Payer: Self-pay

## 2021-06-15 ENCOUNTER — Other Ambulatory Visit: Payer: Self-pay

## 2021-06-15 VITALS — BP 118/70 | HR 55 | Temp 97.5°F | Ht 72.0 in | Wt 226.0 lb

## 2021-06-15 DIAGNOSIS — Z724 Inappropriate diet and eating habits: Secondary | ICD-10-CM

## 2021-06-15 DIAGNOSIS — Z6833 Body mass index (BMI) 33.0-33.9, adult: Secondary | ICD-10-CM | POA: Diagnosis not present

## 2021-06-15 DIAGNOSIS — E669 Obesity, unspecified: Secondary | ICD-10-CM

## 2021-06-15 DIAGNOSIS — E7849 Other hyperlipidemia: Secondary | ICD-10-CM

## 2021-06-15 DIAGNOSIS — E88819 Insulin resistance, unspecified: Secondary | ICD-10-CM

## 2021-06-15 DIAGNOSIS — E8881 Metabolic syndrome: Secondary | ICD-10-CM | POA: Diagnosis not present

## 2021-06-15 DIAGNOSIS — R632 Polyphagia: Secondary | ICD-10-CM

## 2021-06-15 DIAGNOSIS — E66812 Obesity, class 2: Secondary | ICD-10-CM

## 2021-06-15 MED ORDER — TIRZEPATIDE 5 MG/0.5ML ~~LOC~~ SOAJ
5.0000 mg | SUBCUTANEOUS | 0 refills | Status: DC
  Filled 2021-06-15 – 2021-07-10 (×2): qty 6, 84d supply, fill #0

## 2021-06-15 NOTE — Progress Notes (Signed)
Chief Complaint:   OBESITY Andrew Gray is here to discuss his progress with his obesity treatment plan along with follow-up of his obesity related diagnoses. See Medical Weight Management Flowsheet for complete bioelectrical impedance results.  Today's visit was #: 4 Starting weight: 260 lbs Starting date: 05/19/2020 Weight change since last visit: 7 lbs Total lbs lost to date: 34 lbs Total weight loss percentage to date: -13.08%  Nutrition Plan: Keeping a food journal and adhering to recommended goals of 2000-2500 calories and 125 grams of protein daily for 90% of the time. Activity: Walking/cardio for 30 minutes 3-4 times per week.  Anti-obesity medications: Mounjaro 5 mg subcutaneously weekly. Reported side effects: None.  Interim History: Andrew Gray says he is happy with his medication and plan.  Assessment/Plan:   1. Polyphagia Controlled. Current treatment: Mounjaro 5 mg subcutaneously weekly.    Plan:  Continue Mounjaro 5 mg subcutaneously weekly.  Will refill today. He will continue to focus on protein-rich, low simple carbohydrate foods. We reviewed the importance of hydration, regular exercise for stress reduction, and restorative sleep.  In 2 weeks, will call in 2.5 mg dose for 90 days.  - Refill tirzepatide (MOUNJARO) 5 MG/0.5ML Pen; Inject 5 mg (1 pen) into the skin once a week.  Dispense: 6 mL; Refill: 0  2. Other hyperlipidemia Course: Improving. Lipid-lowering medications: None.   Plan: Dietary changes: Increase soluble fiber, decrease simple carbohydrates, decrease saturated fat. Exercise changes: Moderate to vigorous-intensity aerobic activity 150 minutes per week or as tolerated. We will continue to monitor along with PCP/specialists as it pertains to his weight loss journey.  Lab Results  Component Value Date   CHOL 180 04/20/2021   HDL 40 04/20/2021   LDLCALC 122 (H) 04/20/2021   TRIG 97 04/20/2021   CHOLHDL 4.6 08/25/2020   Lab Results  Component Value Date    ALT 39 04/20/2021   AST 27 04/20/2021   ALKPHOS 69 04/20/2021   BILITOT 0.9 04/20/2021   The 10-year ASCVD risk score (Arnett DK, et al., 2019) is: 1.3%   Values used to calculate the score:     Age: 41 years     Sex: Male     Is Non-Hispanic African American: No     Diabetic: No     Tobacco smoker: No     Systolic Blood Pressure: 229 mmHg     Is BP treated: Yes     HDL Cholesterol: 40 mg/dL     Total Cholesterol: 180 mg/dL  3. Insulin resistance Improving. Goal is HgbA1c < 5.7, fasting insulin closer to 5.  Medication: Ozempic 5 mg subcutaneously weekly.    Plan:  He will continue to focus on protein-rich, low simple carbohydrate foods. We reviewed the importance of hydration, regular exercise for stress reduction, and restorative sleep.   Lab Results  Component Value Date   HGBA1C 4.4 Repeated and verified X2. (L) 03/30/2020   Lab Results  Component Value Date   INSULIN 11.8 04/20/2021   INSULIN 17.7 03/30/2020   4. Inappropriate diet or eating habits, excess snacking, night-time eating The current medical regimen is effective;  continue present plan and medications.  5. Obesity, current BMI 30.7  Course: Andrew Gray is currently in the action stage of change. As such, his goal is to continue with weight loss efforts.   Nutrition goals: He has agreed to keeping a food journal and adhering to recommended goals of 2000-2500 calories and 125 grams of protein.   Exercise goals:  As  is.  Behavioral modification strategies: increasing lean protein intake, decreasing simple carbohydrates, and increasing vegetables.  [name] has agreed to follow-up with our clinic in 4 weeks. He was informed of the importance of frequent follow-up visits to maximize his success with intensive lifestyle modifications for his multiple health conditions.   Objective:   Blood pressure 118/70, pulse (!) 55, temperature (!) 97.5 F (36.4 C), temperature source Oral, height 6' (1.829 m), weight 226 lb  (102.5 kg), SpO2 99 %. Body mass index is 30.65 kg/m.  General: Cooperative, alert, well developed, in no acute distress. HEENT: Conjunctivae and lids unremarkable. Cardiovascular: Regular rhythm.  Lungs: Normal work of breathing. Neurologic: No focal deficits.   Lab Results  Component Value Date   CREATININE 1.05 04/20/2021   BUN 21 04/20/2021   NA 139 04/20/2021   K 3.7 04/20/2021   CL 99 04/20/2021   CO2 24 04/20/2021   Lab Results  Component Value Date   ALT 39 04/20/2021   AST 27 04/20/2021   ALKPHOS 69 04/20/2021   BILITOT 0.9 04/20/2021   Lab Results  Component Value Date   HGBA1C 4.4 Repeated and verified X2. (L) 03/30/2020   HGBA1C 4.6 08/26/2018   HGBA1C 4.7 07/12/2015   HGBA1C 4.9 02/22/2015   Lab Results  Component Value Date   INSULIN 11.8 04/20/2021   INSULIN 17.7 03/30/2020   Lab Results  Component Value Date   TSH 1.32 03/30/2020   Lab Results  Component Value Date   CHOL 180 04/20/2021   HDL 40 04/20/2021   LDLCALC 122 (H) 04/20/2021   TRIG 97 04/20/2021   CHOLHDL 4.6 08/25/2020   Lab Results  Component Value Date   VD25OH 40 08/26/2018   VD25OH 37 08/07/2016   VD25OH 57 07/12/2015   Lab Results  Component Value Date   WBC 4.1 03/30/2020   HGB 14.7 03/30/2020   HCT 41.7 03/30/2020   MCV 83.4 03/30/2020   PLT 191.0 03/30/2020   Attestation Statements:   Reviewed by clinician on day of visit: allergies, medications, problem list, medical history, surgical history, family history, social history, and previous encounter notes.  I, Water quality scientist, CMA, am acting as transcriptionist for Briscoe Deutscher, DO  I have reviewed the above documentation for accuracy and completeness, and I agree with the above. -  Briscoe Deutscher, DO, MS, FAAFP, DABOM - Family and Bariatric Medicine.

## 2021-06-23 ENCOUNTER — Other Ambulatory Visit (HOSPITAL_COMMUNITY): Payer: Self-pay

## 2021-06-23 MED ORDER — METHYLPHENIDATE HCL ER (CD) 20 MG PO CPCR
20.0000 mg | ORAL_CAPSULE | Freq: Every day | ORAL | 0 refills | Status: DC
  Filled 2021-06-23: qty 30, 30d supply, fill #0

## 2021-07-04 DIAGNOSIS — G4733 Obstructive sleep apnea (adult) (pediatric): Secondary | ICD-10-CM | POA: Diagnosis not present

## 2021-07-10 ENCOUNTER — Other Ambulatory Visit (HOSPITAL_COMMUNITY): Payer: Self-pay

## 2021-07-13 ENCOUNTER — Ambulatory Visit (INDEPENDENT_AMBULATORY_CARE_PROVIDER_SITE_OTHER): Payer: 59 | Admitting: Family Medicine

## 2021-08-07 ENCOUNTER — Ambulatory Visit (INDEPENDENT_AMBULATORY_CARE_PROVIDER_SITE_OTHER): Payer: 59 | Admitting: Family Medicine

## 2021-08-07 ENCOUNTER — Encounter (INDEPENDENT_AMBULATORY_CARE_PROVIDER_SITE_OTHER): Payer: Self-pay | Admitting: Family Medicine

## 2021-08-07 VITALS — BP 111/70 | HR 58 | Temp 97.5°F | Ht 72.0 in | Wt 224.0 lb

## 2021-08-07 DIAGNOSIS — Z6830 Body mass index (BMI) 30.0-30.9, adult: Secondary | ICD-10-CM

## 2021-08-07 DIAGNOSIS — E669 Obesity, unspecified: Secondary | ICD-10-CM | POA: Diagnosis not present

## 2021-08-07 DIAGNOSIS — Z724 Inappropriate diet and eating habits: Secondary | ICD-10-CM | POA: Diagnosis not present

## 2021-08-07 DIAGNOSIS — E7800 Pure hypercholesterolemia, unspecified: Secondary | ICD-10-CM

## 2021-08-07 DIAGNOSIS — K760 Fatty (change of) liver, not elsewhere classified: Secondary | ICD-10-CM

## 2021-08-07 NOTE — Progress Notes (Signed)
Chief Complaint:   OBESITY Andrew Gray is here to discuss his progress with his obesity treatment plan along with follow-up of his obesity related diagnoses. See Medical Weight Management Flowsheet for complete bioelectrical impedance results.  Today's visit was #: 18 Starting weight: 260 lbs Starting date: 05/19/2020 Weight change since last visit: -2 lbs Total lbs lost to date: 36 lbs Total weight loss percentage to date: -13.85%  Nutrition Plan: Keeping a food journal and adhering to recommended goals of 2000-2500 calories and 125 grams of protein daily for 80% of the time. Activity: Walking/cardio for 20 minutes 3-4 times per week Anti-obesity medications: Mounjaro 5 mg subcutaneously weekly. Reported side effects: None.  Assessment/Plan:   1. Hepatic steatosis Intensive lifestyle modifications are the first line treatment for this issue. Andrew Gray has been successful, losing > 13% total body weight. LFTs have improved. Lipid panel improving.   Plan: The current medical regimen is effective;  continue present plan and medications.  Lab Results  Component Value Date   ALT 39 04/20/2021   AST 27 04/20/2021   ALKPHOS 69 04/20/2021   BILITOT 0.9 04/20/2021   2. Pure hypercholesterolemia Course: Improving, but not optimized. Lipid-lowering medications: None.   Plan: Dietary changes: Increase soluble fiber, decrease simple carbohydrates, decrease saturated fat. Exercise changes: Moderate to vigorous-intensity aerobic activity 150 minutes per week or as tolerated. We will continue to monitor along with PCP as it pertains to his weight loss journey.  Lab Results  Component Value Date   CHOL 180 04/20/2021   HDL 40 04/20/2021   LDLCALC 122 (H) 04/20/2021   TRIG 97 04/20/2021   CHOLHDL 4.6 08/25/2020   Lab Results  Component Value Date   ALT 39 04/20/2021   AST 27 04/20/2021   ALKPHOS 69 04/20/2021   BILITOT 0.9 04/20/2021   The 10-year ASCVD risk score (Arnett DK, et al.,  2019) is: 1.3%   Values used to calculate the score:     Age: 41 years     Sex: Male     Is Non-Hispanic African American: No     Diabetic: No     Tobacco smoker: No     Systolic Blood Pressure: 128 mmHg     Is BP treated: Yes     HDL Cholesterol: 40 mg/dL     Total Cholesterol: 180 mg/dL  3. Inappropriate diet or eating habits, excess snacking, night-time eating Improved. The current medical regimen is effective;  continue present plan and medications.  4. Obesity, current BMI 30.5 Course: Andrew Gray is currently in the action stage of change. As such, his goal is to continue with weight loss efforts.   Nutrition goals: He has agreed to keeping a food journal and adhering to recommended goals of 2000-2500 calories and 125 grams of protein.   Exercise goals: As is.  Behavioral modification strategies: increasing lean protein intake, decreasing simple carbohydrates, increasing vegetables, and increasing water intake.  Andrew Gray has agreed to follow-up with our clinic in 6 weeks. He was informed of the importance of frequent follow-up visits to maximize his success with intensive lifestyle modifications for his multiple health conditions.   Objective:   There were no vitals taken for this visit. There is no height or weight on file to calculate BMI.  General: Cooperative, alert, well developed, in no acute distress. HEENT: Conjunctivae and lids unremarkable. Cardiovascular: Regular rhythm.  Lungs: Normal work of breathing. Neurologic: No focal deficits.   Lab Results  Component Value Date   CREATININE  1.05 04/20/2021   BUN 21 04/20/2021   NA 139 04/20/2021   K 3.7 04/20/2021   CL 99 04/20/2021   CO2 24 04/20/2021   Lab Results  Component Value Date   ALT 39 04/20/2021   AST 27 04/20/2021   ALKPHOS 69 04/20/2021   BILITOT 0.9 04/20/2021   Lab Results  Component Value Date   HGBA1C 4.4 Repeated and verified X2. (L) 03/30/2020   HGBA1C 4.6 08/26/2018   HGBA1C 4.7 07/12/2015    HGBA1C 4.9 02/22/2015   Lab Results  Component Value Date   INSULIN 11.8 04/20/2021   INSULIN 17.7 03/30/2020   Lab Results  Component Value Date   TSH 1.32 03/30/2020   Lab Results  Component Value Date   CHOL 180 04/20/2021   HDL 40 04/20/2021   LDLCALC 122 (H) 04/20/2021   TRIG 97 04/20/2021   CHOLHDL 4.6 08/25/2020   Lab Results  Component Value Date   VD25OH 40 08/26/2018   VD25OH 37 08/07/2016   VD25OH 57 07/12/2015   Lab Results  Component Value Date   WBC 4.1 03/30/2020   HGB 14.7 03/30/2020   HCT 41.7 03/30/2020   MCV 83.4 03/30/2020   PLT 191.0 03/30/2020   Attestation Statements:   Reviewed by clinician on day of visit: allergies, medications, problem list, medical history, surgical history, family history, social history, and previous encounter notes.   Leodis Binet Friedenbach, CMA, am acting as Location manager for [employer], DO.  I have reviewed the above documentation for accuracy and completeness, and I agree with the above. -  Briscoe Deutscher, DO, MS, FAAFP, DABOM - Family and Bariatric Medicine.

## 2021-08-09 ENCOUNTER — Other Ambulatory Visit (HOSPITAL_COMMUNITY): Payer: Self-pay

## 2021-08-09 MED FILL — Febuxostat Tab 80 MG: ORAL | 90 days supply | Qty: 90 | Fill #3 | Status: AC

## 2021-08-10 ENCOUNTER — Other Ambulatory Visit (HOSPITAL_COMMUNITY): Payer: Self-pay

## 2021-08-10 MED ORDER — METHYLPHENIDATE HCL ER (CD) 20 MG PO CPCR
20.0000 mg | ORAL_CAPSULE | Freq: Every day | ORAL | 0 refills | Status: DC
  Filled 2021-08-10: qty 30, 30d supply, fill #0

## 2021-08-18 DIAGNOSIS — Z79899 Other long term (current) drug therapy: Secondary | ICD-10-CM | POA: Diagnosis not present

## 2021-08-18 DIAGNOSIS — F902 Attention-deficit hyperactivity disorder, combined type: Secondary | ICD-10-CM | POA: Diagnosis not present

## 2021-09-08 ENCOUNTER — Other Ambulatory Visit (HOSPITAL_COMMUNITY): Payer: Self-pay

## 2021-09-11 ENCOUNTER — Other Ambulatory Visit (HOSPITAL_COMMUNITY): Payer: Self-pay

## 2021-09-22 ENCOUNTER — Other Ambulatory Visit (HOSPITAL_COMMUNITY): Payer: Self-pay

## 2021-10-03 DIAGNOSIS — G4733 Obstructive sleep apnea (adult) (pediatric): Secondary | ICD-10-CM | POA: Diagnosis not present

## 2021-10-05 ENCOUNTER — Other Ambulatory Visit (HOSPITAL_COMMUNITY): Payer: Self-pay

## 2021-10-05 MED ORDER — METHYLPHENIDATE HCL ER (CD) 20 MG PO CPCR
20.0000 mg | ORAL_CAPSULE | Freq: Every day | ORAL | 0 refills | Status: DC
  Filled 2021-10-05: qty 30, 30d supply, fill #0

## 2021-10-06 ENCOUNTER — Other Ambulatory Visit (HOSPITAL_COMMUNITY): Payer: Self-pay

## 2021-10-13 ENCOUNTER — Other Ambulatory Visit (HOSPITAL_COMMUNITY): Payer: Self-pay

## 2021-10-26 ENCOUNTER — Encounter: Payer: Self-pay | Admitting: Internal Medicine

## 2021-10-26 NOTE — Progress Notes (Unsigned)
Subjective:    Patient ID: Andrew Gray, male    DOB: June 05, 1980, 41 y.o.   MRN: [id number]     HPI Steed is here for a physical exam.   On uloric ? Dec/ stop hctz   Medications and allergies reviewed with patient and updated if appropriate.  Current Outpatient Medications on File Prior to Visit  Medication Sig Dispense Refill   amLODipine (NORVASC) 10 MG tablet TAKE 1 TABLET (10 MG TOTAL) BY MOUTH DAILY. 90 tablet 3   Azelaic Acid 15 % cream Apply 1 application topically 2 (two) times daily. 30 g 11   buPROPion (WELLBUTRIN XL) 150 MG 24 hr tablet TAKE 1 TABLET BY MOUTH ONCE A DAY 90 tablet 2   cetirizine (ZYRTEC) 10 MG tablet Take 10 mg by mouth daily.     Febuxostat 80 MG TABS TAKE 1 TABLET (80 MG TOTAL) BY MOUTH DAILY. 90 tablet 3   gabapentin (NEURONTIN) 300 MG capsule Take 1 capsule (300 mg total) by mouth at bedtime. 90 capsule 3   hydrochlorothiazide (HYDRODIURIL) 25 MG tablet TAKE 1 TABLET (25 MG TOTAL) BY MOUTH DAILY. 90 tablet 3   methylphenidate (METADATE CD) 20 MG CR capsule Take 1 capsule (20 mg total) by mouth daily. 30 capsule 0   pantoprazole (PROTONIX) 40 MG tablet Take 1 tablet by mouth daily. 90 tablet 3   tadalafil (CIALIS) 20 MG tablet Take 1 tablet (20 mg total) by mouth daily as needed for erectile dysfunction (please use troches). 30 tablet 11   tirzepatide (MOUNJARO) 5 MG/0.5ML Pen Inject 5 mg (1 pen) into the skin once a week. 6 mL 0   Topiramate ER (TROKENDI XR) 50 MG CP24 TAKE 1 CAPSULE BY MOUTH ONCE A DAY 90 capsule 2   traZODone (DESYREL) 50 MG tablet Take 1 to 2 tablets by mouth at bedtime. 60 tablet 0   No current facility-administered medications on file prior to visit.    Review of Systems     Objective:  There were no vitals filed for this visit. There were no vitals filed for this visit. There is no height or weight on file to calculate BMI.  BP Readings from Last 3 Encounters:  08/07/21 111/70  06/15/21 118/70  05/11/21 109/71     Wt Readings from Last 3 Encounters:  08/07/21 224 lb (101.6 kg)  06/15/21 226 lb (102.5 kg)  05/11/21 233 lb (105.7 kg)      Physical Exam Constitutional: He appears well-developed and well-nourished. No distress.  HENT:  Head: Normocephalic and atraumatic.  Right Ear: External ear normal.  Left Ear: External ear normal.  Mouth/Throat: Oropharynx is clear and moist.  Normal ear canals and TM b/l  Eyes: Conjunctivae and EOM are normal.  Neck: Neck supple. No tracheal deviation present. No thyromegaly present.  No carotid bruit  Cardiovascular: Normal rate, regular rhythm, normal heart sounds and intact distal pulses.   No murmur heard. Pulmonary/Chest: Effort normal and breath sounds normal. No respiratory distress. He has no wheezes. He has no rales.  Abdominal: Soft. He exhibits no distension. There is no tenderness.  Genitourinary: deferred  Musculoskeletal: He exhibits no edema.  Lymphadenopathy:   He has no cervical adenopathy.  Skin: Skin is warm and dry. He is not diaphoretic.  Psychiatric: He has a normal mood and affect. His behavior is normal.         Assessment & Plan:   Physical exam: Screening blood work  ordered Exercise  Weight   Substance abuse   none   Reviewed recommended immunizations.   Health Maintenance  Topic Date Due   Hepatitis C Screening  Never done   INFLUENZA VACCINE  12/05/2021   TETANUS/TDAP  08/17/2027   COVID-19 Vaccine  Completed   HPV VACCINES  Aged Out   HIV Screening  Discontinued     See Problem List for Assessment and Plan of chronic medical problems.

## 2021-10-27 ENCOUNTER — Encounter: Payer: 59 | Admitting: Internal Medicine

## 2021-10-27 ENCOUNTER — Ambulatory Visit (INDEPENDENT_AMBULATORY_CARE_PROVIDER_SITE_OTHER): Payer: 59 | Admitting: Internal Medicine

## 2021-10-27 VITALS — BP 122/82 | HR 58 | Temp 97.7°F | Ht 72.0 in | Wt 235.0 lb

## 2021-10-27 DIAGNOSIS — M1A0790 Idiopathic chronic gout, unspecified ankle and foot, without tophus (tophi): Secondary | ICD-10-CM

## 2021-10-27 DIAGNOSIS — G4733 Obstructive sleep apnea (adult) (pediatric): Secondary | ICD-10-CM

## 2021-10-27 DIAGNOSIS — I10 Essential (primary) hypertension: Secondary | ICD-10-CM

## 2021-10-27 DIAGNOSIS — E782 Mixed hyperlipidemia: Secondary | ICD-10-CM | POA: Diagnosis not present

## 2021-10-27 DIAGNOSIS — Z0000 Encounter for general adult medical examination without abnormal findings: Secondary | ICD-10-CM | POA: Diagnosis not present

## 2021-10-27 DIAGNOSIS — Z1159 Encounter for screening for other viral diseases: Secondary | ICD-10-CM | POA: Diagnosis not present

## 2021-10-27 DIAGNOSIS — E669 Obesity, unspecified: Secondary | ICD-10-CM | POA: Diagnosis not present

## 2021-10-28 NOTE — Assessment & Plan Note (Signed)
Chronic No gout symptoms Last uric acid level well controlled Continue Uloric 80 mg daily

## 2021-10-28 NOTE — Assessment & Plan Note (Signed)
Chronic Regular exercise and healthy diet encouraged Check lipid panel  Continue lifestyle control 

## 2021-10-29 IMAGING — DX DG CHEST 2V
3 series · 3 of 3 positions shown · non-contrast
Comparison: Radiographs of the left ribs and chest 07/14/2020.

CLINICAL DATA: Rib pain. Chest pain. Additional provided: Follow-up
pleural effusion.

EXAM:
CHEST - 2 VIEW

[chest pa (1 of 2)]
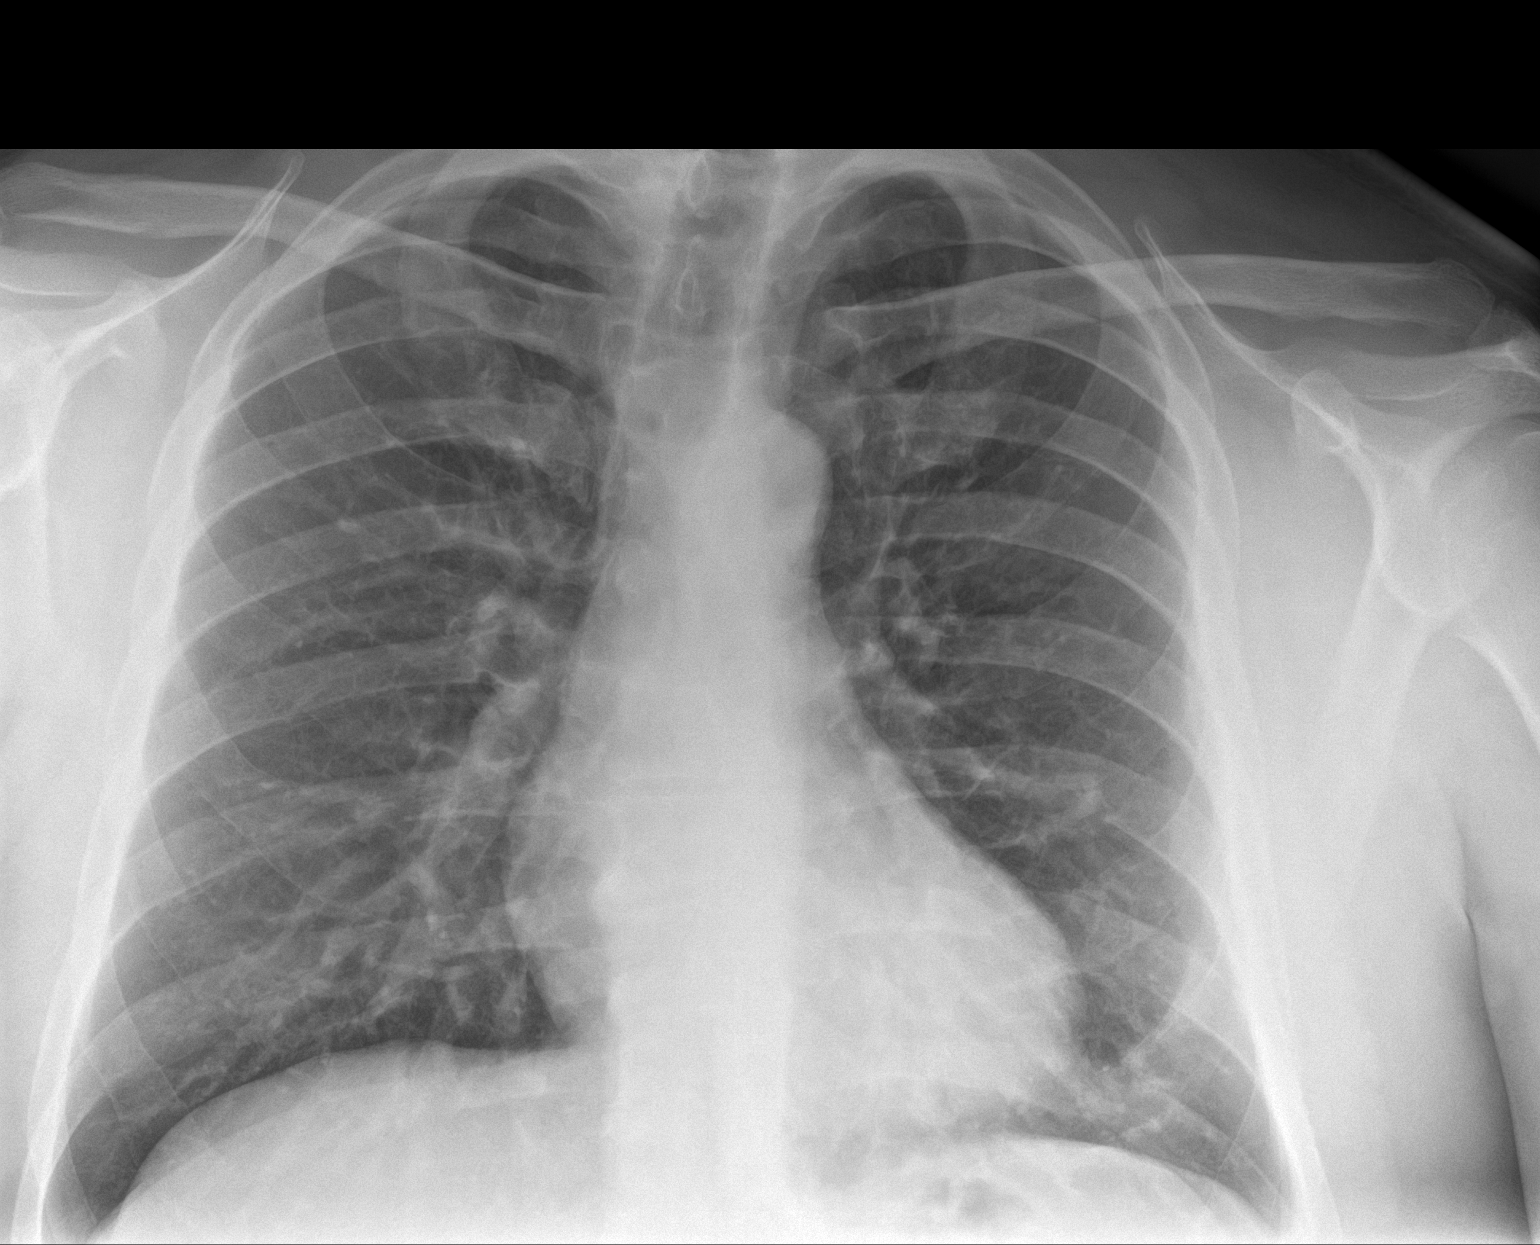

[chest lat]
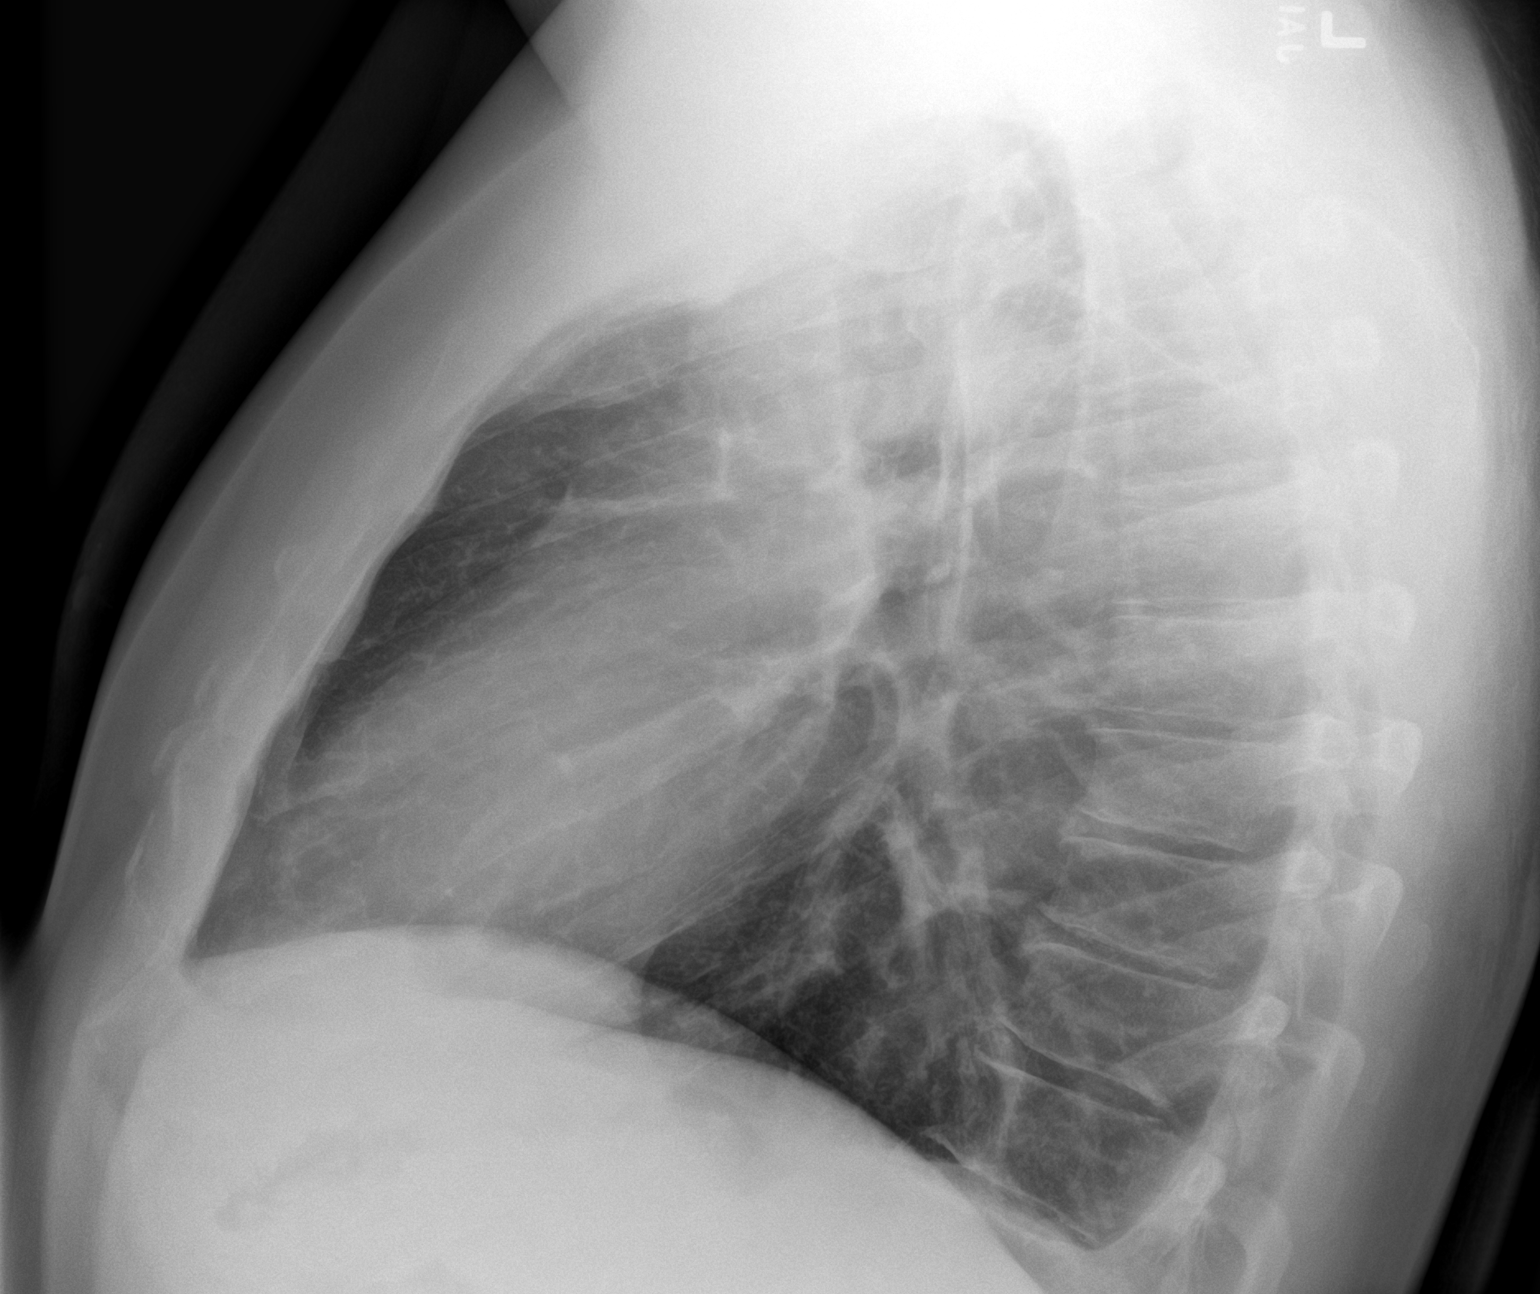

[chest pa (2 of 2)]
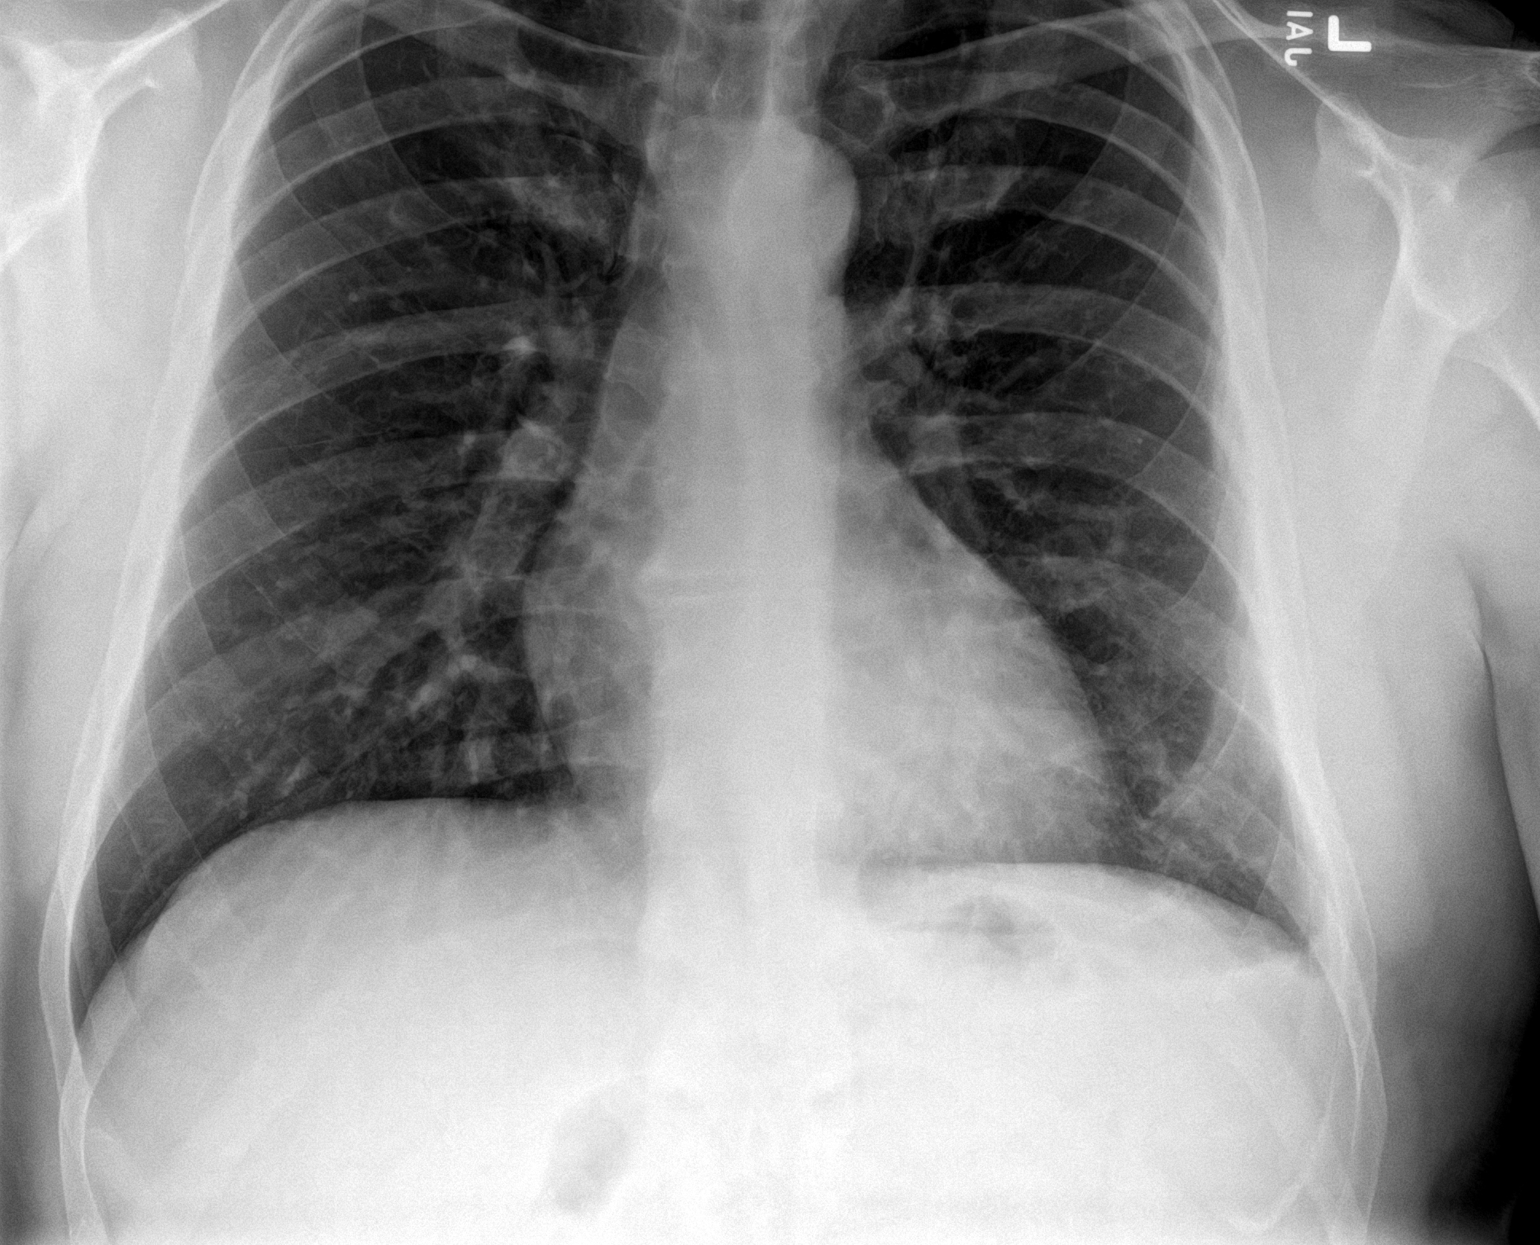

[3 of 3 positions shown; findings below may reference images not displayed]

FINDINGS: Heart size within normal limits. No appreciable airspace
consolidation. Persistent small left pleural effusion. No evidence
of pneumothorax. Acute fractures of the lateral left fifth and sixth
ribs were better appreciated on the prior examination of 07/14/2020.
IMPRESSION: Unchanged small left pleural effusion.

Known acute fractures of the lateral left fifth and sixth ribs were
better appreciated on the prior examination of 07/14/2020.

## 2021-11-01 ENCOUNTER — Other Ambulatory Visit (HOSPITAL_COMMUNITY): Payer: Self-pay

## 2021-11-01 MED ORDER — MOUNJARO 5 MG/0.5ML ~~LOC~~ SOAJ
SUBCUTANEOUS | 0 refills | Status: DC
  Filled 2021-11-01: qty 2, 28d supply, fill #0

## 2021-11-03 ENCOUNTER — Other Ambulatory Visit (HOSPITAL_COMMUNITY): Payer: Self-pay

## 2021-11-03 ENCOUNTER — Other Ambulatory Visit: Payer: Self-pay | Admitting: Internal Medicine

## 2021-11-03 DIAGNOSIS — M1A0790 Idiopathic chronic gout, unspecified ankle and foot, without tophus (tophi): Secondary | ICD-10-CM

## 2021-11-03 MED ORDER — PANTOPRAZOLE SODIUM 40 MG PO TBEC
DELAYED_RELEASE_TABLET | Freq: Every day | ORAL | 3 refills | Status: DC
  Filled 2021-11-03: qty 90, 90d supply, fill #0
  Filled 2022-02-10: qty 90, 90d supply, fill #1
  Filled 2022-05-12: qty 90, 90d supply, fill #2
  Filled 2022-08-12: qty 90, 90d supply, fill #3

## 2021-11-03 MED ORDER — FEBUXOSTAT 80 MG PO TABS
1.0000 | ORAL_TABLET | Freq: Every day | ORAL | 3 refills | Status: DC
  Filled 2021-11-03: qty 90, 90d supply, fill #0
  Filled 2022-02-10: qty 90, 90d supply, fill #1
  Filled 2022-05-12: qty 90, 90d supply, fill #2
  Filled 2022-08-12: qty 90, 90d supply, fill #3

## 2021-11-06 ENCOUNTER — Other Ambulatory Visit (HOSPITAL_COMMUNITY): Payer: Self-pay

## 2021-11-10 ENCOUNTER — Other Ambulatory Visit (HOSPITAL_COMMUNITY): Payer: Self-pay

## 2021-11-11 ENCOUNTER — Other Ambulatory Visit (HOSPITAL_COMMUNITY): Payer: Self-pay

## 2021-11-14 ENCOUNTER — Other Ambulatory Visit (HOSPITAL_COMMUNITY): Payer: Self-pay

## 2021-11-16 ENCOUNTER — Other Ambulatory Visit (HOSPITAL_COMMUNITY): Payer: Self-pay

## 2021-11-16 DIAGNOSIS — I10 Essential (primary) hypertension: Secondary | ICD-10-CM | POA: Diagnosis not present

## 2021-11-16 DIAGNOSIS — M109 Gout, unspecified: Secondary | ICD-10-CM | POA: Diagnosis not present

## 2021-11-16 DIAGNOSIS — E669 Obesity, unspecified: Secondary | ICD-10-CM | POA: Diagnosis not present

## 2021-11-16 DIAGNOSIS — R632 Polyphagia: Secondary | ICD-10-CM | POA: Diagnosis not present

## 2021-11-16 DIAGNOSIS — F909 Attention-deficit hyperactivity disorder, unspecified type: Secondary | ICD-10-CM | POA: Diagnosis not present

## 2021-11-16 DIAGNOSIS — K219 Gastro-esophageal reflux disease without esophagitis: Secondary | ICD-10-CM | POA: Diagnosis not present

## 2021-11-16 DIAGNOSIS — G43909 Migraine, unspecified, not intractable, without status migrainosus: Secondary | ICD-10-CM | POA: Diagnosis not present

## 2021-11-16 MED ORDER — MOUNJARO 7.5 MG/0.5ML ~~LOC~~ SOAJ
7.5000 mg | SUBCUTANEOUS | 0 refills | Status: DC
  Filled 2021-11-16: qty 2, 28d supply, fill #0

## 2021-11-17 ENCOUNTER — Other Ambulatory Visit (HOSPITAL_COMMUNITY): Payer: Self-pay

## 2021-11-17 DIAGNOSIS — F902 Attention-deficit hyperactivity disorder, combined type: Secondary | ICD-10-CM | POA: Diagnosis not present

## 2021-11-17 DIAGNOSIS — Z79899 Other long term (current) drug therapy: Secondary | ICD-10-CM | POA: Diagnosis not present

## 2021-11-17 MED ORDER — METHYLPHENIDATE HCL ER (CD) 20 MG PO CPCR
20.0000 mg | ORAL_CAPSULE | Freq: Every day | ORAL | 0 refills | Status: DC
  Filled 2021-11-17: qty 30, 30d supply, fill #0

## 2021-11-18 ENCOUNTER — Other Ambulatory Visit (HOSPITAL_COMMUNITY): Payer: Self-pay

## 2021-12-01 ENCOUNTER — Ambulatory Visit: Payer: 59 | Admitting: Adult Health

## 2021-12-01 ENCOUNTER — Ambulatory Visit: Payer: 59 | Admitting: Nurse Practitioner

## 2021-12-01 ENCOUNTER — Encounter: Payer: Self-pay | Admitting: Nurse Practitioner

## 2021-12-01 DIAGNOSIS — G4733 Obstructive sleep apnea (adult) (pediatric): Secondary | ICD-10-CM

## 2021-12-01 NOTE — Patient Instructions (Signed)
Continue to use CPAP every night, minimum of 4-6 hours a night.  Change equipment every 30 days or as directed by DME. Wash your tubing with warm soap and water daily, hang to dry. Wash humidifier portion weekly.  Be aware of reduced alertness and do not drive or operate heavy machinery if experiencing this or drowsiness.  Exercise encouraged, as tolerated. Healthy weight management discussed.  Avoid or decrease alcohol consumption and medications that make you more sleepy, if possible. Notify if persistent daytime sleepiness occurs even with consistent use of CPAP.  Follow up in one year with Dr. Halford Chessman. If symptoms do not improve or worsen, please contact office for sooner follow up or seek emergency care.

## 2021-12-01 NOTE — Progress Notes (Signed)
$'@Patient'H$  ID: Andrew Gray, male    DOB: 26-May-1980, 41 y.o.   MRN: [id number]  Chief Complaint  Patient presents with   Follow-up    Pt states he is doing about the same.    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 He is a patient of Dr. Juanetta Gray and last seen in office 11/18/2020. Past medical history significant for HTN, gout, HLD, ADHD, fatty liver, GERD, lactose intolerance, allergies.   TEST/EVENTS:  12/27/2019 HST: AHI 22.2, SpO2 low 83%  11/18/2020: OV with Dr. Halford Gray. Doing well on CPAP. Working with [facility]. No changes to regimen.   12/01/2021: Today - follow up Patient presents today for follow up. He has been doing great on CPAP therapy and has no concerns or complaints today. He wears it nightly without fail. Snoring has resolved and wakes in the morning feeling well rested. Denies drowsy driving or morning headaches. No significant leaks or problems with pressures. He has been working on weight loss and has lost around 15 pounds since his initial study in 2021.  10/30/2021-11/28/2021:  CPAP 6-20 cmH2O Download 30/30 days used; 100% >4 hr; av use 6 hr 57 min Pressure median 6.5, 95th 7.8 Leaks median 0, 95th 3 AHI 0.8/h  Allergies  Allergen Reactions   Sulfa Antibiotics Nausea And Vomiting    Immunization History  Administered Date(s) Administered   Influenza,inj,Quad PF,6+ Mos 01/29/2018, 02/10/2020, 02/01/2021   Influenza-Unspecified 02/05/2015, 02/04/2017, 01/20/2019   Moderna Covid-19 Vaccine Bivalent Booster 41yr & up 02/18/2021   PFIZER(Purple Top)SARS-COV-2 Vaccination 05/15/2019, 06/27/2019, 02/26/2020   Tdap 05/15/1998, 08/16/2017   Typhoid Live 05/13/2017    Past Medical History:  Diagnosis Date   ADHD    Back pain    Fatty liver    GERD (gastroesophageal reflux disease)    Gout    Hypertension    Joint pain    Lactose intolerance    OSA (obstructive  sleep apnea)    Seasonal allergies     Tobacco History: Social History   Tobacco Use  Smoking Status Never   Passive exposure: Never  Smokeless Tobacco Never   Counseling given: Not Answered   Outpatient Medications Prior to Visit  Medication Sig Dispense Refill   amLODipine (NORVASC) 10 MG tablet TAKE 1 TABLET (10 MG TOTAL) BY MOUTH DAILY. 90 tablet 3   cetirizine (ZYRTEC) 10 MG tablet Take 10 mg by mouth daily.     Febuxostat 80 MG TABS TAKE 1 TABLET BY MOUTH DAILY. 90 tablet 3   gabapentin (NEURONTIN) 300 MG capsule Take 1 capsule (300 mg total) by mouth at bedtime. 90 capsule 3   hydrochlorothiazide (HYDRODIURIL) 25 MG tablet TAKE 1 TABLET (25 MG TOTAL) BY MOUTH DAILY. 90 tablet 3   methylphenidate (METADATE CD) 20 MG CR capsule Take 1 capsule by mouth daily. 30 capsule 0   pantoprazole (PROTONIX) 40 MG tablet Take 1 tablet by mouth daily. 90 tablet 3   tadalafil (CIALIS) 20 MG tablet Take 1 tablet (20 mg total) by mouth daily as needed for erectile dysfunction (please use troches). 30 tablet 11   tirzepatide (MOUNJARO) 7.5 MG/0.5ML Pen Inject 7.5 mg subcutaneously once a week as directed. 2 mL 0   Topiramate ER (TROKENDI XR) 50 MG CP24 TAKE 1 CAPSULE BY MOUTH ONCE A DAY 90 capsule 2   traZODone (DESYREL) 50 MG tablet Take 1 to 2 tablets by mouth  at bedtime. 60 tablet 0   tirzepatide (MOUNJARO) 5 MG/0.5ML Pen Inject 5 mg (1 pen) into the skin once a week. 6 mL 0   tirzepatide (MOUNJARO) 5 MG/0.5ML Pen Inject 5 mg under the skin once a week 6 mL 0   No facility-administered medications prior to visit.     Review of Systems:   Constitutional: No weight loss or gain, night sweats, fevers, chills, fatigue, or lassitude. HEENT: No headaches, difficulty swallowing, tooth/dental problems, or sore throat. No sneezing, itching, ear ache, nasal congestion, or post nasal drip CV:  No chest pain, orthopnea, PND, swelling in lower extremities, anasarca, dizziness, palpitations,  syncope Resp: No shortness of breath with exertion or at rest. No excess mucus or change in color of mucus. No productive or non-productive. No hemoptysis. No wheezing.  No chest wall deformity Neuro: No dizziness or lightheadedness.  Psych: No depression or anxiety. Mood stable.     Physical Exam:  BP 128/82 (BP Location: Right Arm, Patient Position: Sitting, Cuff Size: Large)   Pulse (!) 58   Temp 98.3 F (36.8 C) (Oral)   Ht 6' (1.829 m)   Wt 241 lb 12.8 oz (109.7 kg)   SpO2 99%   BMI 32.79 kg/m   GEN: Pleasant, interactive, well-appearing; obese; in no acute distress. HEENT:  Normocephalic and atraumatic. PERRLA. Sclera white. Nasal turbinates pink, moist and patent bilaterally. No rhinorrhea present. Oropharynx pink and moist, without exudate or edema. No lesions, ulcerations, or postnasal drip.  NECK:  Supple w/ fair ROM. No JVD present. No lymphadenopathy.   CV: RRR, no m/r/g, no peripheral edema. Pulses intact, +2 bilaterally. No cyanosis, pallor or clubbing. PULMONARY:  Unlabored, regular breathing. Clear bilaterally A&P w/o wheezes/rales/rhonchi. No accessory muscle use.  GI: BS present and normoactive. Soft, non-tender to palpation.  Neuro: A/Ox3. No focal deficits noted.   Skin: Warm, no lesions or rashe Psych: Normal affect and behavior. Judgement and thought content appropriate.     Lab Results:  CBC    Component Value Date/Time   WBC 4.1 03/30/2020 0931   RBC 5.00 03/30/2020 0931   HGB 14.7 03/30/2020 0931   HCT 41.7 03/30/2020 0931   PLT 191.0 03/30/2020 0931   MCV 83.4 03/30/2020 0931   MCH 30.4 08/26/2018 0735   MCHC 35.3 03/30/2020 0931   RDW 13.3 03/30/2020 0931   LYMPHSABS 1.9 03/30/2020 0931   MONOABS 0.4 03/30/2020 0931   EOSABS 0.2 03/30/2020 0931   BASOSABS 0.0 03/30/2020 0931    BMET    Component Value Date/Time   NA 139 04/20/2021 0734   K 3.7 04/20/2021 0734   CL 99 04/20/2021 0734   CO2 24 04/20/2021 0734   GLUCOSE 80 04/20/2021  0734   GLUCOSE 87 03/30/2020 0931   BUN 21 04/20/2021 0734   CREATININE 1.05 04/20/2021 0734   CREATININE 0.85 08/26/2018 0735   CALCIUM 9.8 04/20/2021 0734   GFRNONAA 111 08/26/2018 0735   GFRAA 128 08/26/2018 0735    BNP No results found for: "BNP"   Imaging:  No results found.        No data to display          No results found for: "NITRICOXIDE"      Assessment & Plan:   OSA (obstructive sleep apnea) Excellent compliance and continues to receive good benefit. Essentially no breakthrough events; AHI 0.8/h. He is currently set at 6-20 cmH2O but max pressure usage is 8.7 cmH2O. Discussed decreasing his settings but he is  not currently having any issues and is comfortable leaving things as is.   Patient Instructions  Continue to use CPAP every night, minimum of 4-6 hours a night.  Change equipment every 30 days or as directed by DME. Wash your tubing with warm soap and water daily, hang to dry. Wash humidifier portion weekly.  Be aware of reduced alertness and do not drive or operate heavy machinery if experiencing this or drowsiness.  Exercise encouraged, as tolerated. Healthy weight management discussed.  Avoid or decrease alcohol consumption and medications that make you more sleepy, if possible. Notify if persistent daytime sleepiness occurs even with consistent use of CPAP.  Follow up in one year with Dr. Halford Gray. If symptoms do not improve or worsen, please contact office for sooner follow up or seek emergency care.     I spent 25 minutes of dedicated to the care of this patient on the date of this encounter to include pre-visit review of records, face-to-face time with the patient discussing conditions above, post visit ordering of testing, clinical documentation with the electronic health record, making appropriate referrals as documented, and communicating necessary findings to members of the patients care team.  Clayton Bibles, NP 12/01/2021  Pt aware  and understands NP's role.

## 2021-12-01 NOTE — Assessment & Plan Note (Signed)
Excellent compliance and continues to receive good benefit. Essentially no breakthrough events; AHI 0.8/h. He is currently set at 6-20 cmH2O but max pressure usage is 8.7 cmH2O. Discussed decreasing his settings but he is not currently having any issues and is comfortable leaving things as is.   Patient Instructions  Continue to use CPAP every night, minimum of 4-6 hours a night.  Change equipment every 30 days or as directed by DME. Wash your tubing with warm soap and water daily, hang to dry. Wash humidifier portion weekly.  Be aware of reduced alertness and do not drive or operate heavy machinery if experiencing this or drowsiness.  Exercise encouraged, as tolerated. Healthy weight management discussed.  Avoid or decrease alcohol consumption and medications that make you more sleepy, if possible. Notify if persistent daytime sleepiness occurs even with consistent use of CPAP.  Follow up in one year with Dr. Halford Chessman. If symptoms do not improve or worsen, please contact office for sooner follow up or seek emergency care.

## 2021-12-07 NOTE — Progress Notes (Signed)
Reviewed and agree with assessment/plan.   Chesley Mires, MD [facility] Pulmonary/Critical Care 12/07/2021, 9:50 AM Pager:  [phone]

## 2021-12-09 ENCOUNTER — Other Ambulatory Visit (HOSPITAL_COMMUNITY): Payer: Self-pay

## 2021-12-12 ENCOUNTER — Other Ambulatory Visit (HOSPITAL_COMMUNITY): Payer: Self-pay

## 2021-12-13 ENCOUNTER — Encounter (INDEPENDENT_AMBULATORY_CARE_PROVIDER_SITE_OTHER): Payer: Self-pay

## 2021-12-14 ENCOUNTER — Other Ambulatory Visit (HOSPITAL_COMMUNITY): Payer: Self-pay

## 2021-12-20 ENCOUNTER — Other Ambulatory Visit (HOSPITAL_COMMUNITY): Payer: Self-pay

## 2021-12-20 DIAGNOSIS — Z6831 Body mass index (BMI) 31.0-31.9, adult: Secondary | ICD-10-CM | POA: Diagnosis not present

## 2021-12-20 DIAGNOSIS — E669 Obesity, unspecified: Secondary | ICD-10-CM | POA: Diagnosis not present

## 2021-12-20 DIAGNOSIS — R632 Polyphagia: Secondary | ICD-10-CM | POA: Diagnosis not present

## 2021-12-20 DIAGNOSIS — I10 Essential (primary) hypertension: Secondary | ICD-10-CM | POA: Diagnosis not present

## 2021-12-20 MED ORDER — SAXENDA 18 MG/3ML ~~LOC~~ SOPN
PEN_INJECTOR | SUBCUTANEOUS | 6 refills | Status: DC
  Filled 2021-12-20: qty 15, 30d supply, fill #0

## 2021-12-26 ENCOUNTER — Other Ambulatory Visit (HOSPITAL_COMMUNITY): Payer: Self-pay

## 2021-12-26 MED ORDER — METHYLPHENIDATE HCL ER (CD) 20 MG PO CPCR
20.0000 mg | ORAL_CAPSULE | Freq: Every day | ORAL | 0 refills | Status: DC
  Filled 2021-12-26: qty 30, 30d supply, fill #0

## 2021-12-27 ENCOUNTER — Other Ambulatory Visit (HOSPITAL_COMMUNITY): Payer: Self-pay

## 2022-01-04 DIAGNOSIS — G4733 Obstructive sleep apnea (adult) (pediatric): Secondary | ICD-10-CM | POA: Diagnosis not present

## 2022-01-13 ENCOUNTER — Other Ambulatory Visit: Payer: Self-pay | Admitting: Internal Medicine

## 2022-01-13 ENCOUNTER — Other Ambulatory Visit (HOSPITAL_COMMUNITY): Payer: Self-pay

## 2022-01-13 DIAGNOSIS — I10 Essential (primary) hypertension: Secondary | ICD-10-CM

## 2022-01-15 ENCOUNTER — Other Ambulatory Visit (HOSPITAL_COMMUNITY): Payer: Self-pay

## 2022-01-15 MED ORDER — HYDROCHLOROTHIAZIDE 25 MG PO TABS
ORAL_TABLET | Freq: Every day | ORAL | 3 refills | Status: DC
  Filled 2022-01-15: qty 90, 90d supply, fill #0
  Filled 2022-04-07: qty 90, 90d supply, fill #1
  Filled 2022-07-15: qty 90, 90d supply, fill #2
  Filled 2022-10-12: qty 90, 90d supply, fill #3

## 2022-01-15 MED ORDER — AMLODIPINE BESYLATE 10 MG PO TABS
ORAL_TABLET | Freq: Every day | ORAL | 3 refills | Status: DC
  Filled 2022-01-15: qty 90, 90d supply, fill #0
  Filled 2022-04-07: qty 90, 90d supply, fill #1
  Filled 2022-07-15: qty 90, 90d supply, fill #2
  Filled 2022-10-12: qty 90, 90d supply, fill #3

## 2022-01-16 ENCOUNTER — Other Ambulatory Visit (HOSPITAL_COMMUNITY): Payer: Self-pay

## 2022-01-16 MED ORDER — TRAZODONE HCL 50 MG PO TABS
50.0000 mg | ORAL_TABLET | Freq: Every day | ORAL | 3 refills | Status: AC
  Filled 2022-01-16: qty 60, 30d supply, fill #0

## 2022-01-17 ENCOUNTER — Other Ambulatory Visit (HOSPITAL_COMMUNITY): Payer: Self-pay

## 2022-01-17 DIAGNOSIS — E669 Obesity, unspecified: Secondary | ICD-10-CM | POA: Diagnosis not present

## 2022-01-17 DIAGNOSIS — H01003 Unspecified blepharitis right eye, unspecified eyelid: Secondary | ICD-10-CM | POA: Diagnosis not present

## 2022-01-17 DIAGNOSIS — I10 Essential (primary) hypertension: Secondary | ICD-10-CM | POA: Diagnosis not present

## 2022-01-17 DIAGNOSIS — R632 Polyphagia: Secondary | ICD-10-CM | POA: Diagnosis not present

## 2022-01-17 DIAGNOSIS — Z6832 Body mass index (BMI) 32.0-32.9, adult: Secondary | ICD-10-CM | POA: Diagnosis not present

## 2022-01-17 MED ORDER — POLYMYXIN B-TRIMETHOPRIM 10000-0.1 UNIT/ML-% OP SOLN
OPHTHALMIC | 0 refills | Status: DC
  Filled 2022-01-17: qty 10, 10d supply, fill #0

## 2022-01-18 ENCOUNTER — Other Ambulatory Visit (HOSPITAL_COMMUNITY): Payer: Self-pay

## 2022-01-18 ENCOUNTER — Encounter: Payer: Self-pay | Admitting: Internal Medicine

## 2022-01-18 ENCOUNTER — Other Ambulatory Visit: Payer: Self-pay | Admitting: Internal Medicine

## 2022-01-18 MED ORDER — OFLOXACIN 0.3 % OP SOLN
1.0000 [drp] | Freq: Four times a day (QID) | OPHTHALMIC | 0 refills | Status: DC
  Filled 2022-01-18: qty 5, 7d supply, fill #0

## 2022-01-20 ENCOUNTER — Other Ambulatory Visit (HOSPITAL_COMMUNITY): Payer: Self-pay

## 2022-01-24 ENCOUNTER — Other Ambulatory Visit (HOSPITAL_COMMUNITY): Payer: Self-pay

## 2022-02-03 ENCOUNTER — Other Ambulatory Visit (HOSPITAL_COMMUNITY): Payer: Self-pay

## 2022-02-10 ENCOUNTER — Other Ambulatory Visit (HOSPITAL_COMMUNITY): Payer: Self-pay

## 2022-02-12 ENCOUNTER — Other Ambulatory Visit (HOSPITAL_COMMUNITY): Payer: Self-pay

## 2022-02-13 ENCOUNTER — Other Ambulatory Visit (HOSPITAL_COMMUNITY): Payer: Self-pay

## 2022-02-14 DIAGNOSIS — E669 Obesity, unspecified: Secondary | ICD-10-CM | POA: Diagnosis not present

## 2022-02-14 DIAGNOSIS — Z6832 Body mass index (BMI) 32.0-32.9, adult: Secondary | ICD-10-CM | POA: Diagnosis not present

## 2022-02-14 DIAGNOSIS — R632 Polyphagia: Secondary | ICD-10-CM | POA: Diagnosis not present

## 2022-02-15 ENCOUNTER — Encounter: Payer: Self-pay | Admitting: Internal Medicine

## 2022-02-19 ENCOUNTER — Other Ambulatory Visit (HOSPITAL_COMMUNITY): Payer: Self-pay

## 2022-02-19 MED ORDER — METHYLPHENIDATE HCL ER (CD) 20 MG PO CPCR
20.0000 mg | ORAL_CAPSULE | Freq: Every day | ORAL | 0 refills | Status: DC
  Filled 2022-02-19: qty 30, 30d supply, fill #0

## 2022-02-23 ENCOUNTER — Other Ambulatory Visit (HOSPITAL_COMMUNITY): Payer: Self-pay

## 2022-02-23 DIAGNOSIS — Z0100 Encounter for examination of eyes and vision without abnormal findings: Secondary | ICD-10-CM | POA: Diagnosis not present

## 2022-02-23 MED ORDER — NEOMYCIN-POLYMYXIN-DEXAMETH 0.1 % OP OINT
1.0000 | TOPICAL_OINTMENT | Freq: Three times a day (TID) | OPHTHALMIC | 0 refills | Status: DC
  Filled 2022-02-23: qty 3.5, 20d supply, fill #0

## 2022-03-08 ENCOUNTER — Other Ambulatory Visit (INDEPENDENT_AMBULATORY_CARE_PROVIDER_SITE_OTHER): Payer: Self-pay

## 2022-03-13 ENCOUNTER — Other Ambulatory Visit (HOSPITAL_COMMUNITY): Payer: Self-pay

## 2022-03-13 MED ORDER — TOPIRAMATE ER 50 MG PO CAP24
1.0000 | ORAL_CAPSULE | Freq: Every day | ORAL | 2 refills | Status: DC
  Filled 2022-03-13: qty 90, 90d supply, fill #0
  Filled 2022-06-14: qty 90, 90d supply, fill #1

## 2022-03-14 ENCOUNTER — Other Ambulatory Visit (HOSPITAL_COMMUNITY): Payer: Self-pay

## 2022-03-21 DIAGNOSIS — Z6833 Body mass index (BMI) 33.0-33.9, adult: Secondary | ICD-10-CM | POA: Diagnosis not present

## 2022-03-21 DIAGNOSIS — R632 Polyphagia: Secondary | ICD-10-CM | POA: Diagnosis not present

## 2022-03-21 DIAGNOSIS — E669 Obesity, unspecified: Secondary | ICD-10-CM | POA: Diagnosis not present

## 2022-03-21 DIAGNOSIS — I10 Essential (primary) hypertension: Secondary | ICD-10-CM | POA: Diagnosis not present

## 2022-03-27 ENCOUNTER — Other Ambulatory Visit (HOSPITAL_COMMUNITY): Payer: Self-pay

## 2022-04-06 DIAGNOSIS — G4733 Obstructive sleep apnea (adult) (pediatric): Secondary | ICD-10-CM | POA: Diagnosis not present

## 2022-04-07 ENCOUNTER — Other Ambulatory Visit (HOSPITAL_COMMUNITY): Payer: Self-pay

## 2022-04-10 ENCOUNTER — Other Ambulatory Visit (HOSPITAL_COMMUNITY): Payer: Self-pay

## 2022-04-10 MED ORDER — METHYLPHENIDATE HCL ER (CD) 20 MG PO CPCR
20.0000 mg | ORAL_CAPSULE | Freq: Every day | ORAL | 0 refills | Status: DC
  Filled 2022-04-10: qty 30, 30d supply, fill #0

## 2022-05-02 ENCOUNTER — Other Ambulatory Visit (HOSPITAL_COMMUNITY): Payer: Self-pay

## 2022-05-02 MED ORDER — ZEPBOUND 5 MG/0.5ML ~~LOC~~ SOAJ
5.0000 mg | SUBCUTANEOUS | 0 refills | Status: DC
  Filled 2022-05-02 – 2022-05-04 (×2): qty 2, 28d supply, fill #0

## 2022-05-03 ENCOUNTER — Other Ambulatory Visit (HOSPITAL_COMMUNITY): Payer: Self-pay

## 2022-05-04 ENCOUNTER — Other Ambulatory Visit (HOSPITAL_COMMUNITY): Payer: Self-pay

## 2022-05-04 ENCOUNTER — Encounter: Payer: Self-pay | Admitting: Internal Medicine

## 2022-05-12 ENCOUNTER — Other Ambulatory Visit (HOSPITAL_COMMUNITY): Payer: Self-pay

## 2022-05-14 ENCOUNTER — Other Ambulatory Visit: Payer: Self-pay

## 2022-05-14 DIAGNOSIS — I10 Essential (primary) hypertension: Secondary | ICD-10-CM | POA: Diagnosis not present

## 2022-05-14 DIAGNOSIS — E6609 Other obesity due to excess calories: Secondary | ICD-10-CM | POA: Diagnosis not present

## 2022-05-14 DIAGNOSIS — K760 Fatty (change of) liver, not elsewhere classified: Secondary | ICD-10-CM | POA: Diagnosis not present

## 2022-05-14 DIAGNOSIS — R632 Polyphagia: Secondary | ICD-10-CM | POA: Diagnosis not present

## 2022-05-14 DIAGNOSIS — Z6834 Body mass index (BMI) 34.0-34.9, adult: Secondary | ICD-10-CM | POA: Diagnosis not present

## 2022-05-18 ENCOUNTER — Other Ambulatory Visit: Payer: Self-pay

## 2022-05-18 ENCOUNTER — Other Ambulatory Visit (HOSPITAL_COMMUNITY): Payer: Self-pay

## 2022-05-18 DIAGNOSIS — F902 Attention-deficit hyperactivity disorder, combined type: Secondary | ICD-10-CM | POA: Diagnosis not present

## 2022-05-18 DIAGNOSIS — Z79899 Other long term (current) drug therapy: Secondary | ICD-10-CM | POA: Diagnosis not present

## 2022-05-18 MED ORDER — METHYLPHENIDATE HCL ER (CD) 30 MG PO CPCR
30.0000 mg | ORAL_CAPSULE | Freq: Every day | ORAL | 0 refills | Status: DC
  Filled 2022-05-18 – 2022-05-30 (×2): qty 30, 30d supply, fill #0

## 2022-05-22 DIAGNOSIS — F902 Attention-deficit hyperactivity disorder, combined type: Secondary | ICD-10-CM | POA: Diagnosis not present

## 2022-05-23 ENCOUNTER — Other Ambulatory Visit: Payer: Self-pay

## 2022-05-23 ENCOUNTER — Other Ambulatory Visit (HOSPITAL_COMMUNITY): Payer: Self-pay

## 2022-05-30 ENCOUNTER — Other Ambulatory Visit (HOSPITAL_COMMUNITY): Payer: Self-pay

## 2022-06-06 ENCOUNTER — Other Ambulatory Visit (HOSPITAL_COMMUNITY): Payer: Self-pay

## 2022-06-06 ENCOUNTER — Other Ambulatory Visit: Payer: Self-pay

## 2022-06-06 MED ORDER — ZEPBOUND 7.5 MG/0.5ML ~~LOC~~ SOAJ
7.5000 mg | SUBCUTANEOUS | 0 refills | Status: DC
  Filled 2022-06-06: qty 2, 28d supply, fill #0
  Filled 2022-06-29: qty 2, 28d supply, fill #1

## 2022-06-14 ENCOUNTER — Other Ambulatory Visit (HOSPITAL_COMMUNITY): Payer: Self-pay

## 2022-06-14 ENCOUNTER — Other Ambulatory Visit: Payer: Self-pay

## 2022-06-15 ENCOUNTER — Other Ambulatory Visit: Payer: Self-pay

## 2022-06-29 ENCOUNTER — Other Ambulatory Visit (HOSPITAL_COMMUNITY): Payer: Self-pay

## 2022-06-29 ENCOUNTER — Other Ambulatory Visit (INDEPENDENT_AMBULATORY_CARE_PROVIDER_SITE_OTHER): Payer: Self-pay

## 2022-07-03 ENCOUNTER — Other Ambulatory Visit (HOSPITAL_BASED_OUTPATIENT_CLINIC_OR_DEPARTMENT_OTHER): Payer: Self-pay

## 2022-07-03 ENCOUNTER — Other Ambulatory Visit (HOSPITAL_COMMUNITY): Payer: Self-pay

## 2022-07-03 MED ORDER — TOPIRAMATE ER 50 MG PO CAP24
50.0000 mg | ORAL_CAPSULE | Freq: Every day | ORAL | 2 refills | Status: DC
  Filled 2022-07-03: qty 90, 90d supply, fill #0

## 2022-07-04 ENCOUNTER — Other Ambulatory Visit (HOSPITAL_COMMUNITY): Payer: Self-pay

## 2022-07-04 DIAGNOSIS — Z6833 Body mass index (BMI) 33.0-33.9, adult: Secondary | ICD-10-CM | POA: Diagnosis not present

## 2022-07-04 DIAGNOSIS — R632 Polyphagia: Secondary | ICD-10-CM | POA: Diagnosis not present

## 2022-07-04 DIAGNOSIS — E669 Obesity, unspecified: Secondary | ICD-10-CM | POA: Diagnosis not present

## 2022-07-04 DIAGNOSIS — E785 Hyperlipidemia, unspecified: Secondary | ICD-10-CM | POA: Diagnosis not present

## 2022-07-04 DIAGNOSIS — K760 Fatty (change of) liver, not elsewhere classified: Secondary | ICD-10-CM | POA: Diagnosis not present

## 2022-07-04 DIAGNOSIS — I10 Essential (primary) hypertension: Secondary | ICD-10-CM | POA: Diagnosis not present

## 2022-07-04 MED ORDER — ZEPBOUND 15 MG/0.5ML ~~LOC~~ SOAJ
15.0000 mg | SUBCUTANEOUS | 2 refills | Status: DC
  Filled 2022-07-04: qty 2, 28d supply, fill #0

## 2022-07-05 ENCOUNTER — Other Ambulatory Visit: Payer: Self-pay

## 2022-07-06 ENCOUNTER — Other Ambulatory Visit: Payer: Self-pay

## 2022-07-06 ENCOUNTER — Other Ambulatory Visit (HOSPITAL_COMMUNITY): Payer: Self-pay

## 2022-07-06 MED ORDER — METHYLPHENIDATE HCL ER (CD) 30 MG PO CPCR
30.0000 mg | ORAL_CAPSULE | Freq: Every day | ORAL | 0 refills | Status: DC
  Filled 2022-07-06 – 2022-07-12 (×2): qty 30, 30d supply, fill #0

## 2022-07-08 DIAGNOSIS — G4733 Obstructive sleep apnea (adult) (pediatric): Secondary | ICD-10-CM | POA: Diagnosis not present

## 2022-07-10 ENCOUNTER — Other Ambulatory Visit (HOSPITAL_COMMUNITY): Payer: Self-pay

## 2022-07-11 ENCOUNTER — Other Ambulatory Visit: Payer: Self-pay

## 2022-07-11 ENCOUNTER — Other Ambulatory Visit (HOSPITAL_COMMUNITY): Payer: Self-pay

## 2022-07-12 ENCOUNTER — Other Ambulatory Visit (HOSPITAL_COMMUNITY): Payer: Self-pay

## 2022-07-16 ENCOUNTER — Other Ambulatory Visit: Payer: Self-pay

## 2022-07-27 ENCOUNTER — Ambulatory Visit: Payer: Commercial Managed Care - PPO | Admitting: Sports Medicine

## 2022-07-27 ENCOUNTER — Other Ambulatory Visit (HOSPITAL_COMMUNITY): Payer: Self-pay

## 2022-07-27 ENCOUNTER — Other Ambulatory Visit: Payer: Self-pay

## 2022-07-27 VITALS — BP 122/80 | HR 58 | Ht 72.0 in | Wt 248.0 lb

## 2022-07-27 DIAGNOSIS — G8929 Other chronic pain: Secondary | ICD-10-CM | POA: Diagnosis not present

## 2022-07-27 DIAGNOSIS — M4317 Spondylolisthesis, lumbosacral region: Secondary | ICD-10-CM | POA: Diagnosis not present

## 2022-07-27 DIAGNOSIS — M5442 Lumbago with sciatica, left side: Secondary | ICD-10-CM | POA: Diagnosis not present

## 2022-07-27 MED ORDER — PREDNISONE 50 MG PO TABS
50.0000 mg | ORAL_TABLET | Freq: Every day | ORAL | 0 refills | Status: DC
  Filled 2022-07-27 (×2): qty 5, 5d supply, fill #0

## 2022-07-27 NOTE — Patient Instructions (Signed)
Good to see you   

## 2022-07-27 NOTE — Progress Notes (Signed)
Benito Mccreedy D.[place] Phone: [phone]   Assessment and Plan:     1. Spondylolisthesis at L5-S1 level 2. Chronic left-sided low back pain with left-sided sciatica  -Chronic with exacerbation, initial sports medicine visit - Consistent with left-sided sciatica of lumbar origin.  Similar to prior flares - No red flag symptoms on physical exam, so no additional imaging at today's visit - May start prednisone 50 mg daily as needed for symptom flare - May continue gabapentin as needed   Pertinent previous records reviewed include none   Follow Up: As needed   Subjective:   I, Pincus Badder, am serving as a Education administrator for Doctor Glennon Mac  Chief Complaint: low back pain   HPI:   07/27/22 Patient is a 42 year old male complaining of low back pain. Patient states that he has a hx of lumbar radiculopathy and he is in flare. Last night he was lifting boxes and is really tight and flared today   Relevant Historical Information: Hypertension, spondylolisthesis L5-S1  Additional pertinent review of systems negative.   Current Outpatient Medications:    predniSONE (DELTASONE) 50 MG tablet, Take 1 tablet (50 mg total) by mouth daily., Disp: 5 tablet, Rfl: 0   amLODipine (NORVASC) 10 MG tablet, TAKE 1 TABLET (10 MG TOTAL) BY MOUTH DAILY., Disp: 90 tablet, Rfl: 3   cetirizine (ZYRTEC) 10 MG tablet, Take 10 mg by mouth daily., Disp: , Rfl:    Febuxostat 80 MG TABS, TAKE 1 TABLET BY MOUTH DAILY., Disp: 90 tablet, Rfl: 3   gabapentin (NEURONTIN) 300 MG capsule, Take 1 capsule (300 mg total) by mouth at bedtime., Disp: 90 capsule, Rfl: 3   hydrochlorothiazide (HYDRODIURIL) 25 MG tablet, TAKE 1 TABLET (25 MG TOTAL) BY MOUTH DAILY., Disp: 90 tablet, Rfl: 3   Liraglutide -Weight Management (SAXENDA) 18 MG/3ML SOPN, Inject 3 mL Subcutaneous daily 30 days, Disp: 15 mL, Rfl: 6   methylphenidate (METADATE CD) 20 MG  CR capsule, Take 1 capsule (20 mg total) by mouth daily., Disp: 30 capsule, Rfl: 0   methylphenidate (METADATE CD) 30 MG CR capsule, Take 1 capsule (30 mg total) by mouth daily., Disp: 30 capsule, Rfl: 0   neomycin-polymyxin-dexameth (MAXITROL) 0.1 % OINT, Place 1 Application into the right eye 3 (three) times daily., Disp: 3.5 g, Rfl: 0   ofloxacin (OCUFLOX) 0.3 % ophthalmic solution, Place 1 drop into both eyes 4 (four) times daily. Use for 5-7 days., Disp: 5 mL, Rfl: 0   pantoprazole (PROTONIX) 40 MG tablet, Take 1 tablet by mouth daily., Disp: 90 tablet, Rfl: 3   tadalafil (CIALIS) 20 MG tablet, Take 1 tablet (20 mg total) by mouth daily as needed for erectile dysfunction (please use troches)., Disp: 30 tablet, Rfl: 11   tirzepatide (MOUNJARO) 7.5 MG/0.5ML Pen, Inject 7.5 mg into the skin once a week., Disp: 2 mL, Rfl: 0   tirzepatide (ZEPBOUND) 15 MG/0.5ML Pen, Inject 15 mg into the skin every 7 (seven) days., Disp: 6 mL, Rfl: 2   tirzepatide (ZEPBOUND) 5 MG/0.5ML Pen, Inject 5 mg into the skin once a week., Disp: 6 mL, Rfl: 0   tirzepatide (ZEPBOUND) 7.5 MG/0.5ML Pen, Inject 7.5 mg into the skin every 7 (seven) days., Disp: 6 mL, Rfl: 0   Topiramate ER (TROKENDI XR) 50 MG CP24, TAKE 1 CAPSULE BY MOUTH ONCE A DAY, Disp: 90 capsule, Rfl: 2   Topiramate ER (TROKENDI XR) 50 MG CP24,  Take 1 capsule by mouth daily., Disp: 90 capsule, Rfl: 2   Topiramate ER (TROKENDI XR) 50 MG CP24, Take 1 capsule (50 mg total) by mouth daily., Disp: 90 capsule, Rfl: 2   traZODone (DESYREL) 50 MG tablet, Take 1 to 2 tablets by mouth at bedtime., Disp: 60 tablet, Rfl: 3   Objective:     Vitals:   07/27/22 1135  BP: 122/80  Pulse: (!) 58  SpO2: 98%  Weight: 248 lb (112.5 kg)  Height: 6' (1.829 m)      Body mass index is 33.63 kg/m.    Physical Exam:    Gen: Appears well, nad, nontoxic and pleasant Psych: Alert and oriented, appropriate mood and affect Neuro: sensation intact, strength is 5/5 in upper  and lower extremities, muscle tone wnl Skin: no susupicious lesions or rashes  Back - Normal skin, Spine with normal alignment and no deformity.   No tenderness to vertebral process palpation.   Paraspinous muscles are not tender and without spasm NTTP gluteal musculature Straight leg raise negative Trendelenberg negative Piriformis Test negative  Equal strength with single-leg weightbearing plantarflexion  Electronically signed by:  Benito Mccreedy D.[facility] 12:00 PM 07/27/22

## 2022-08-13 ENCOUNTER — Other Ambulatory Visit: Payer: Self-pay

## 2022-08-14 ENCOUNTER — Other Ambulatory Visit (HOSPITAL_COMMUNITY): Payer: Self-pay

## 2022-08-14 ENCOUNTER — Other Ambulatory Visit: Payer: Self-pay

## 2022-08-14 DIAGNOSIS — R632 Polyphagia: Secondary | ICD-10-CM | POA: Diagnosis not present

## 2022-08-14 DIAGNOSIS — Z6833 Body mass index (BMI) 33.0-33.9, adult: Secondary | ICD-10-CM | POA: Diagnosis not present

## 2022-08-14 DIAGNOSIS — I10 Essential (primary) hypertension: Secondary | ICD-10-CM | POA: Diagnosis not present

## 2022-08-14 DIAGNOSIS — K760 Fatty (change of) liver, not elsewhere classified: Secondary | ICD-10-CM | POA: Diagnosis not present

## 2022-08-14 MED ORDER — ZEPBOUND 15 MG/0.5ML ~~LOC~~ SOAJ
15.0000 mg | SUBCUTANEOUS | 2 refills | Status: DC
  Filled 2022-08-14: qty 2, 28d supply, fill #0

## 2022-09-04 ENCOUNTER — Other Ambulatory Visit (HOSPITAL_COMMUNITY): Payer: Self-pay

## 2022-09-04 MED ORDER — METHYLPHENIDATE HCL ER (CD) 30 MG PO CPCR
30.0000 mg | ORAL_CAPSULE | Freq: Every day | ORAL | 0 refills | Status: DC
  Filled 2022-09-04: qty 30, 30d supply, fill #0

## 2022-10-10 DIAGNOSIS — I10 Essential (primary) hypertension: Secondary | ICD-10-CM | POA: Diagnosis not present

## 2022-10-10 DIAGNOSIS — E559 Vitamin D deficiency, unspecified: Secondary | ICD-10-CM | POA: Diagnosis not present

## 2022-10-10 DIAGNOSIS — K760 Fatty (change of) liver, not elsewhere classified: Secondary | ICD-10-CM | POA: Diagnosis not present

## 2022-10-10 DIAGNOSIS — R7301 Impaired fasting glucose: Secondary | ICD-10-CM | POA: Diagnosis not present

## 2022-10-10 DIAGNOSIS — R5383 Other fatigue: Secondary | ICD-10-CM | POA: Diagnosis not present

## 2022-10-10 DIAGNOSIS — Z1159 Encounter for screening for other viral diseases: Secondary | ICD-10-CM | POA: Diagnosis not present

## 2022-10-10 DIAGNOSIS — E782 Mixed hyperlipidemia: Secondary | ICD-10-CM | POA: Diagnosis not present

## 2022-10-10 DIAGNOSIS — E538 Deficiency of other specified B group vitamins: Secondary | ICD-10-CM | POA: Diagnosis not present

## 2022-10-10 DIAGNOSIS — Z79899 Other long term (current) drug therapy: Secondary | ICD-10-CM | POA: Diagnosis not present

## 2022-10-10 DIAGNOSIS — Z8739 Personal history of other diseases of the musculoskeletal system and connective tissue: Secondary | ICD-10-CM | POA: Diagnosis not present

## 2022-10-11 LAB — LAB REPORT - SCANNED
A1c: 4.4
EGFR: 114
HM Hepatitis Screen: NEGATIVE

## 2022-10-12 ENCOUNTER — Other Ambulatory Visit (HOSPITAL_COMMUNITY): Payer: Self-pay

## 2022-10-12 ENCOUNTER — Encounter: Payer: Self-pay | Admitting: Internal Medicine

## 2022-10-12 ENCOUNTER — Other Ambulatory Visit: Payer: Self-pay

## 2022-10-12 DIAGNOSIS — G4733 Obstructive sleep apnea (adult) (pediatric): Secondary | ICD-10-CM | POA: Diagnosis not present

## 2022-10-13 ENCOUNTER — Other Ambulatory Visit (HOSPITAL_COMMUNITY): Payer: Self-pay

## 2022-10-13 MED ORDER — METHYLPHENIDATE HCL ER (CD) 30 MG PO CPCR
30.0000 mg | ORAL_CAPSULE | Freq: Every day | ORAL | 0 refills | Status: DC
  Filled 2022-10-13: qty 30, 30d supply, fill #0

## 2022-10-15 ENCOUNTER — Other Ambulatory Visit: Payer: Self-pay

## 2022-10-16 ENCOUNTER — Other Ambulatory Visit (HOSPITAL_COMMUNITY): Payer: Self-pay

## 2022-10-16 MED ORDER — VITAMIN D (ERGOCALCIFEROL) 1.25 MG (50000 UNIT) PO CAPS
50000.0000 [IU] | ORAL_CAPSULE | ORAL | 0 refills | Status: DC
  Filled 2022-10-16: qty 12, 84d supply, fill #0

## 2022-10-17 ENCOUNTER — Other Ambulatory Visit (HOSPITAL_COMMUNITY): Payer: Self-pay

## 2022-10-17 MED ORDER — ROSUVASTATIN CALCIUM 5 MG PO TABS
5.0000 mg | ORAL_TABLET | Freq: Every day | ORAL | 3 refills | Status: DC
  Filled 2022-10-17: qty 30, 30d supply, fill #0
  Filled 2022-11-04 – 2022-11-12 (×3): qty 30, 30d supply, fill #1
  Filled 2022-12-20: qty 30, 30d supply, fill #2
  Filled 2023-01-13: qty 30, 30d supply, fill #3

## 2022-10-18 ENCOUNTER — Other Ambulatory Visit (HOSPITAL_COMMUNITY): Payer: Self-pay

## 2022-11-02 ENCOUNTER — Encounter: Payer: Commercial Managed Care - PPO | Admitting: Internal Medicine

## 2022-11-04 ENCOUNTER — Other Ambulatory Visit: Payer: Self-pay | Admitting: Internal Medicine

## 2022-11-04 DIAGNOSIS — M1A0790 Idiopathic chronic gout, unspecified ankle and foot, without tophus (tophi): Secondary | ICD-10-CM

## 2022-11-05 ENCOUNTER — Other Ambulatory Visit (HOSPITAL_COMMUNITY): Payer: Self-pay

## 2022-11-05 ENCOUNTER — Other Ambulatory Visit: Payer: Self-pay

## 2022-11-05 MED ORDER — FEBUXOSTAT 80 MG PO TABS
1.0000 | ORAL_TABLET | Freq: Every day | ORAL | 3 refills | Status: DC
  Filled 2022-11-05: qty 90, 90d supply, fill #0
  Filled 2023-02-10: qty 90, 90d supply, fill #1
  Filled 2023-05-12: qty 90, 90d supply, fill #2
  Filled 2023-08-11: qty 90, 90d supply, fill #3

## 2022-11-07 DIAGNOSIS — E782 Mixed hyperlipidemia: Secondary | ICD-10-CM | POA: Diagnosis not present

## 2022-11-07 DIAGNOSIS — E559 Vitamin D deficiency, unspecified: Secondary | ICD-10-CM | POA: Diagnosis not present

## 2022-11-07 DIAGNOSIS — E669 Obesity, unspecified: Secondary | ICD-10-CM | POA: Diagnosis not present

## 2022-11-07 DIAGNOSIS — K760 Fatty (change of) liver, not elsewhere classified: Secondary | ICD-10-CM | POA: Diagnosis not present

## 2022-11-07 DIAGNOSIS — E538 Deficiency of other specified B group vitamins: Secondary | ICD-10-CM | POA: Diagnosis not present

## 2022-11-07 DIAGNOSIS — Z6834 Body mass index (BMI) 34.0-34.9, adult: Secondary | ICD-10-CM | POA: Diagnosis not present

## 2022-11-07 DIAGNOSIS — G43119 Migraine with aura, intractable, without status migrainosus: Secondary | ICD-10-CM | POA: Diagnosis not present

## 2022-11-07 DIAGNOSIS — F909 Attention-deficit hyperactivity disorder, unspecified type: Secondary | ICD-10-CM | POA: Diagnosis not present

## 2022-11-09 ENCOUNTER — Other Ambulatory Visit: Payer: Self-pay

## 2022-11-09 ENCOUNTER — Other Ambulatory Visit (HOSPITAL_COMMUNITY): Payer: Self-pay

## 2022-11-09 MED ORDER — QULIPTA 60 MG PO TABS
60.0000 mg | ORAL_TABLET | Freq: Every day | ORAL | 2 refills | Status: DC
  Filled 2022-11-09: qty 90, 90d supply, fill #0

## 2022-11-09 MED ORDER — ZEPBOUND 10 MG/0.5ML ~~LOC~~ SOAJ
10.0000 mg | SUBCUTANEOUS | 6 refills | Status: DC
  Filled 2022-11-09 (×2): qty 2, 28d supply, fill #0
  Filled 2023-01-01: qty 2, 28d supply, fill #1

## 2022-11-11 ENCOUNTER — Encounter: Payer: Self-pay | Admitting: Internal Medicine

## 2022-11-11 DIAGNOSIS — K219 Gastro-esophageal reflux disease without esophagitis: Secondary | ICD-10-CM | POA: Insufficient documentation

## 2022-11-11 NOTE — Patient Instructions (Addendum)
Blood work was ordered.   The lab is on the first floor.    Medications changes include :         Return in about 1 year (around 11/12/2023) for Physical Exam.   Health Maintenance, Male Adopting a healthy lifestyle and getting preventive care are important in promoting health and wellness. Ask your health care provider about: The right schedule for you to have regular tests and exams. Things you can do on your own to prevent diseases and keep yourself healthy. What should I know about diet, weight, and exercise? Eat a healthy diet  Eat a diet that includes plenty of vegetables, fruits, low-fat dairy products, and lean protein. Do not eat a lot of foods that are high in solid fats, added sugars, or sodium. Maintain a healthy weight Body mass index (BMI) is a measurement that can be used to identify possible weight problems. It estimates body fat based on height and weight. Your health care provider can help determine your BMI and help you achieve or maintain a healthy weight. Get regular exercise Get regular exercise. This is one of the most important things you can do for your health. Most adults should: Exercise for at least 150 minutes each week. The exercise should increase your heart rate and make you sweat (moderate-intensity exercise). Do strengthening exercises at least twice a week. This is in addition to the moderate-intensity exercise. Spend less time sitting. Even light physical activity can be beneficial. Watch cholesterol and blood lipids Have your blood tested for lipids and cholesterol at 42 years of age, then have this test every 5 years. You may need to have your cholesterol levels checked more often if: Your lipid or cholesterol levels are high. You are older than 42 years of age. You are at high risk for heart disease. What should I know about cancer screening? Many types of cancers can be detected early and may often be prevented. Depending on your  health history and family history, you may need to have cancer screening at various ages. This may include screening for: Colorectal cancer. Prostate cancer. Skin cancer. Lung cancer. What should I know about heart disease, diabetes, and high blood pressure? Blood pressure and heart disease High blood pressure causes heart disease and increases the risk of stroke. This is more likely to develop in people who have high blood pressure readings or are overweight. Talk with your health care provider about your target blood pressure readings. Have your blood pressure checked: Every 3-5 years if you are 48-65 years of age. Every year if you are 29 years old or older. If you are between the ages of 37 and 62 and are a current or former smoker, ask your health care provider if you should have a one-time screening for abdominal aortic aneurysm (AAA). Diabetes Have regular diabetes screenings. This checks your fasting blood sugar level. Have the screening done: Once every three years after age 32 if you are at a normal weight and have a low risk for diabetes. More often and at a younger age if you are overweight or have a high risk for diabetes. What should I know about preventing infection? Hepatitis B If you have a higher risk for hepatitis B, you should be screened for this virus. Talk with your health care provider to find out if you are at risk for hepatitis B infection. Hepatitis C Blood testing is recommended for: Everyone born from [birth year] through 1965. Anyone with known  risk factors for hepatitis C. Sexually transmitted infections (STIs) You should be screened each year for STIs, including gonorrhea and chlamydia, if: You are sexually active and are younger than 42 years of age. You are older than 42 years of age and your health care provider tells you that you are at risk for this type of infection. Your sexual activity has changed since you were last screened, and you are at increased risk  for chlamydia or gonorrhea. Ask your health care provider if you are at risk. Ask your health care provider about whether you are at high risk for HIV. Your health care provider may recommend a prescription medicine to help prevent HIV infection. If you choose to take medicine to prevent HIV, you should first get tested for HIV. You should then be tested every 3 months for as long as you are taking the medicine. Follow these instructions at home: Alcohol use Do not drink alcohol if your health care provider tells you not to drink. If you drink alcohol: Limit how much you have to 0-2 drinks a day. Know how much alcohol is in your drink. In the [place] one drink equals one 12 oz bottle of beer (355 mL), one 5 oz glass of wine (148 mL), or one 1 oz glass of hard liquor (44 mL). Lifestyle Do not use any products that contain nicotine or tobacco. These products include cigarettes, chewing tobacco, and vaping devices, such as e-cigarettes. If you need help quitting, ask your health care provider. Do not use street drugs. Do not share needles. Ask your health care provider for help if you need support or information about quitting drugs. General instructions Schedule regular health, dental, and eye exams. Stay current with your vaccines. Tell your health care provider if: You often feel depressed. You have ever been abused or do not feel safe at home. Summary Adopting a healthy lifestyle and getting preventive care are important in promoting health and wellness. Follow your health care provider's instructions about healthy diet, exercising, and getting tested or screened for diseases. Follow your health care provider's instructions on monitoring your cholesterol and blood pressure. This information is not intended to replace advice given to you by your health care provider. Make sure you discuss any questions you have with your health care provider. Document Revised: 09/12/2020 Document Reviewed:  09/12/2020 Elsevier Patient Education  2024 [employer].

## 2022-11-11 NOTE — Progress Notes (Unsigned)
Subjective:    Patient ID: Andrew Gray, male    DOB: 06-04-1980, 42 y.o.   MRN: [id number]     HPI Andrew Gray is here for a physical exam and his chronic medical problems.   Doing well overall.  Some anxiety but not anything that needs treatment at this time.   Actively working on weight loss.    Medications and allergies reviewed with patient and updated if appropriate.  Current Outpatient Medications on File Prior to Visit  Medication Sig Dispense Refill   amLODipine (NORVASC) 10 MG tablet TAKE 1 TABLET (10 MG TOTAL) BY MOUTH DAILY. 90 tablet 3   Atogepant (QULIPTA) 60 MG TABS Take 1 tablet (60 mg total) by mouth daily. 90 tablet 2   cetirizine (ZYRTEC) 10 MG tablet Take 10 mg by mouth daily.     Febuxostat 80 MG TABS Take 1 tablet (80 mg total) by mouth daily. 90 tablet 3   gabapentin (NEURONTIN) 300 MG capsule Take 1 capsule (300 mg total) by mouth at bedtime. 90 capsule 3   hydrochlorothiazide (HYDRODIURIL) 25 MG tablet TAKE 1 TABLET (25 MG TOTAL) BY MOUTH DAILY. 90 tablet 3   methylphenidate (METADATE CD) 30 MG CR capsule Take 1 capsule (30 mg total) by mouth daily. 30 capsule 0   rosuvastatin (CRESTOR) 5 MG tablet Take 1 tablet (5 mg total) by mouth daily. 30 tablet 3   tadalafil (CIALIS) 20 MG tablet Take 1 tablet (20 mg total) by mouth daily as needed for erectile dysfunction (please use troches). 30 tablet 11   tirzepatide (ZEPBOUND) 10 MG/0.5ML Pen Inject 10 mg into the skin once a week. 2 mL 6   Topiramate ER (TROKENDI XR) 50 MG CP24 Take 1 capsule by mouth daily. 90 capsule 2   traZODone (DESYREL) 50 MG tablet Take 1 to 2 tablets by mouth at bedtime. 60 tablet 3   Vitamin D, Ergocalciferol, (DRISDOL) 1.25 MG (50000 UNIT) CAPS capsule Take 1 capsule (50,000 Units total) by mouth once a week. 12 capsule 0   pantoprazole (PROTONIX) 40 MG tablet Take 1 tablet by mouth daily. 90 tablet 3   No current facility-administered medications on file prior to visit.    Review of  Systems  Constitutional:  Negative for fever.  Eyes:  Negative for visual disturbance.  Respiratory:  Negative for cough, shortness of breath and wheezing.   Cardiovascular:  Negative for chest pain, palpitations and leg swelling.  Gastrointestinal:  Negative for abdominal pain, blood in stool, constipation and diarrhea.       No gerd  Genitourinary:  Negative for difficulty urinating, dysuria and hematuria.  Musculoskeletal:  Negative for arthralgias and back pain.  Skin:  Positive for rash.  Neurological:  Negative for light-headedness and headaches.  Psychiatric/Behavioral:  Negative for dysphoric mood. The patient is nervous/anxious.        Objective:   Vitals:   11/12/22 0749  BP: 118/82  Pulse: 65  Temp: 98.4 F (36.9 C)  SpO2: 98%   Filed Weights   11/12/22 0749  Weight: 270 lb (122.5 kg)   Body mass index is 36.62 kg/m.  BP Readings from Last 3 Encounters:  11/12/22 118/82  07/27/22 122/80  12/01/21 128/82    Wt Readings from Last 3 Encounters:  11/12/22 270 lb (122.5 kg)  07/27/22 248 lb (112.5 kg)  12/01/21 241 lb 12.8 oz (109.7 kg)      Physical Exam Constitutional: He appears well-developed and well-nourished. No distress.  HENT:  Head: Normocephalic and atraumatic.  Right Ear: External ear normal.  Left Ear: External ear normal.  Normal ear canals and TM b/l  Mouth/Throat: Oropharynx is clear and moist. Eyes: Conjunctivae and EOM are normal.  Neck: Neck supple. No tracheal deviation present. No thyromegaly present.  No carotid bruit  Cardiovascular: Normal rate, regular rhythm, normal heart sounds and intact distal pulses.   No murmur heard.  No lower extremity edema. Pulmonary/Chest: Effort normal and breath sounds normal. No respiratory distress. He has no wheezes. He has no rales.  Abdominal: Soft. He exhibits no distension. There is no tenderness.  Genitourinary: deferred  Lymphadenopathy:   He has no cervical adenopathy.  Skin: Skin is  warm and dry. He is not diaphoretic.  Psychiatric: He has a normal mood and affect. His behavior is normal.         Assessment & Plan:   Physical exam: Screening blood work  ordered Exercise   regular 3-4 times a day Weight  working on weight loss Substance abuse   none   Reviewed recommended immunizations.   Health Maintenance  Topic Date Due   COVID-19 Vaccine (5 - 2023-24 season) 11/27/2022 (Originally 01/05/2022)   INFLUENZA VACCINE  12/06/2022   DTaP/Tdap/Td (3 - Td or Tdap) 08/17/2027   Hepatitis C Screening  Completed   HPV VACCINES  Aged Out   HIV Screening  Discontinued     See Problem List for Assessment and Plan of chronic medical problems.

## 2022-11-12 ENCOUNTER — Encounter: Payer: Commercial Managed Care - PPO | Admitting: Internal Medicine

## 2022-11-12 ENCOUNTER — Other Ambulatory Visit: Payer: Self-pay

## 2022-11-12 ENCOUNTER — Ambulatory Visit (INDEPENDENT_AMBULATORY_CARE_PROVIDER_SITE_OTHER): Payer: Commercial Managed Care - PPO | Admitting: Internal Medicine

## 2022-11-12 ENCOUNTER — Other Ambulatory Visit (HOSPITAL_COMMUNITY): Payer: Self-pay

## 2022-11-12 VITALS — BP 118/82 | HR 65 | Temp 98.4°F | Ht 72.0 in | Wt 270.0 lb

## 2022-11-12 DIAGNOSIS — E782 Mixed hyperlipidemia: Secondary | ICD-10-CM

## 2022-11-12 DIAGNOSIS — E669 Obesity, unspecified: Secondary | ICD-10-CM

## 2022-11-12 DIAGNOSIS — Z0000 Encounter for general adult medical examination without abnormal findings: Secondary | ICD-10-CM | POA: Diagnosis not present

## 2022-11-12 DIAGNOSIS — R21 Rash and other nonspecific skin eruption: Secondary | ICD-10-CM | POA: Diagnosis not present

## 2022-11-12 DIAGNOSIS — M1A0790 Idiopathic chronic gout, unspecified ankle and foot, without tophus (tophi): Secondary | ICD-10-CM | POA: Diagnosis not present

## 2022-11-12 DIAGNOSIS — G4733 Obstructive sleep apnea (adult) (pediatric): Secondary | ICD-10-CM

## 2022-11-12 DIAGNOSIS — I10 Essential (primary) hypertension: Secondary | ICD-10-CM

## 2022-11-12 DIAGNOSIS — K219 Gastro-esophageal reflux disease without esophagitis: Secondary | ICD-10-CM

## 2022-11-12 MED ORDER — TRIAMCINOLONE ACETONIDE 0.1 % EX CREA
1.0000 | TOPICAL_CREAM | Freq: Two times a day (BID) | CUTANEOUS | 0 refills | Status: DC
  Filled 2022-11-12: qty 30, 15d supply, fill #0

## 2022-11-12 NOTE — Assessment & Plan Note (Signed)
Chronic GERD controlled Continue pantoprazole 40 mg daily 

## 2022-11-12 NOTE — Assessment & Plan Note (Signed)
Chronic Blood pressure well controlled CMP Continue amlodipine 10 mg daily, HCTZ 25 mg daily 

## 2022-11-12 NOTE — Assessment & Plan Note (Addendum)
Chronic Going to weight center  - working on weight loss

## 2022-11-12 NOTE — Assessment & Plan Note (Signed)
Acute Possible eczema Triamcinolone cream  Has derm appt

## 2022-11-12 NOTE — Assessment & Plan Note (Signed)
Chronic On uloric daily

## 2022-11-12 NOTE — Assessment & Plan Note (Signed)
Chronic On CPAP

## 2022-11-13 ENCOUNTER — Other Ambulatory Visit (HOSPITAL_COMMUNITY): Payer: Self-pay

## 2022-11-15 ENCOUNTER — Encounter: Payer: Self-pay | Admitting: Internal Medicine

## 2022-11-16 ENCOUNTER — Other Ambulatory Visit (HOSPITAL_COMMUNITY): Payer: Self-pay

## 2022-11-16 DIAGNOSIS — F902 Attention-deficit hyperactivity disorder, combined type: Secondary | ICD-10-CM | POA: Diagnosis not present

## 2022-11-16 DIAGNOSIS — Z79899 Other long term (current) drug therapy: Secondary | ICD-10-CM | POA: Diagnosis not present

## 2022-11-16 MED ORDER — METHYLPHENIDATE HCL ER (CD) 30 MG PO CPCR
30.0000 mg | ORAL_CAPSULE | Freq: Every day | ORAL | 0 refills | Status: DC
  Filled 2022-11-16: qty 30, 30d supply, fill #0

## 2022-11-25 ENCOUNTER — Other Ambulatory Visit: Payer: Self-pay | Admitting: Internal Medicine

## 2022-11-26 ENCOUNTER — Other Ambulatory Visit (HOSPITAL_COMMUNITY): Payer: Self-pay

## 2022-11-26 ENCOUNTER — Other Ambulatory Visit: Payer: Self-pay

## 2022-11-26 MED ORDER — PANTOPRAZOLE SODIUM 40 MG PO TBEC
DELAYED_RELEASE_TABLET | Freq: Every day | ORAL | 3 refills | Status: DC
  Filled 2022-11-26: qty 90, 90d supply, fill #0
  Filled 2023-02-21: qty 90, 90d supply, fill #1
  Filled 2023-05-25: qty 90, 90d supply, fill #2
  Filled 2023-08-25: qty 90, 90d supply, fill #3

## 2022-12-12 DIAGNOSIS — E782 Mixed hyperlipidemia: Secondary | ICD-10-CM | POA: Diagnosis not present

## 2022-12-12 DIAGNOSIS — Z6833 Body mass index (BMI) 33.0-33.9, adult: Secondary | ICD-10-CM | POA: Diagnosis not present

## 2022-12-12 DIAGNOSIS — R632 Polyphagia: Secondary | ICD-10-CM | POA: Diagnosis not present

## 2022-12-12 DIAGNOSIS — E669 Obesity, unspecified: Secondary | ICD-10-CM | POA: Diagnosis not present

## 2022-12-14 ENCOUNTER — Other Ambulatory Visit (HOSPITAL_COMMUNITY): Payer: Self-pay

## 2022-12-20 ENCOUNTER — Other Ambulatory Visit: Payer: Self-pay

## 2022-12-28 DIAGNOSIS — L209 Atopic dermatitis, unspecified: Secondary | ICD-10-CM | POA: Diagnosis not present

## 2022-12-29 ENCOUNTER — Other Ambulatory Visit (HOSPITAL_COMMUNITY): Payer: Self-pay

## 2022-12-29 MED ORDER — DESONIDE 0.05 % EX OINT
1.0000 | TOPICAL_OINTMENT | Freq: Two times a day (BID) | CUTANEOUS | 0 refills | Status: AC
  Filled 2022-12-29: qty 30, 7d supply, fill #0

## 2022-12-29 MED ORDER — BETAMETHASONE VALERATE 0.1 % EX CREA
1.0000 | TOPICAL_CREAM | Freq: Every day | CUTANEOUS | 0 refills | Status: AC
  Filled 2022-12-29: qty 45, 30d supply, fill #0

## 2022-12-30 ENCOUNTER — Other Ambulatory Visit (HOSPITAL_COMMUNITY): Payer: Self-pay

## 2022-12-31 ENCOUNTER — Other Ambulatory Visit: Payer: Self-pay

## 2022-12-31 ENCOUNTER — Other Ambulatory Visit (HOSPITAL_COMMUNITY): Payer: Self-pay

## 2022-12-31 MED ORDER — VITAMIN D (ERGOCALCIFEROL) 1.25 MG (50000 UNIT) PO CAPS
50000.0000 [IU] | ORAL_CAPSULE | ORAL | 0 refills | Status: DC
  Filled 2022-12-31: qty 12, 84d supply, fill #0

## 2023-01-01 ENCOUNTER — Other Ambulatory Visit (HOSPITAL_COMMUNITY): Payer: Self-pay

## 2023-01-02 ENCOUNTER — Other Ambulatory Visit (HOSPITAL_COMMUNITY): Payer: Self-pay

## 2023-01-04 ENCOUNTER — Ambulatory Visit (HOSPITAL_BASED_OUTPATIENT_CLINIC_OR_DEPARTMENT_OTHER): Payer: Commercial Managed Care - PPO | Admitting: Pulmonary Disease

## 2023-01-13 ENCOUNTER — Other Ambulatory Visit: Payer: Self-pay | Admitting: Internal Medicine

## 2023-01-13 DIAGNOSIS — G4733 Obstructive sleep apnea (adult) (pediatric): Secondary | ICD-10-CM | POA: Diagnosis not present

## 2023-01-13 DIAGNOSIS — I10 Essential (primary) hypertension: Secondary | ICD-10-CM

## 2023-01-14 ENCOUNTER — Other Ambulatory Visit (HOSPITAL_COMMUNITY): Payer: Self-pay

## 2023-01-14 ENCOUNTER — Other Ambulatory Visit: Payer: Self-pay

## 2023-01-14 MED ORDER — AMLODIPINE BESYLATE 10 MG PO TABS
10.0000 mg | ORAL_TABLET | Freq: Every day | ORAL | 3 refills | Status: DC
  Filled 2023-01-14: qty 90, 90d supply, fill #0
  Filled 2023-04-12: qty 90, 90d supply, fill #1
  Filled 2023-07-09: qty 90, 90d supply, fill #2
  Filled 2023-10-07: qty 90, 90d supply, fill #3

## 2023-01-14 MED ORDER — HYDROCHLOROTHIAZIDE 25 MG PO TABS
25.0000 mg | ORAL_TABLET | Freq: Every day | ORAL | 3 refills | Status: DC
  Filled 2023-01-14: qty 90, 90d supply, fill #0
  Filled 2023-04-12: qty 90, 90d supply, fill #1
  Filled 2023-07-09: qty 90, 90d supply, fill #2
  Filled 2023-10-07: qty 90, 90d supply, fill #3

## 2023-01-15 ENCOUNTER — Other Ambulatory Visit (HOSPITAL_COMMUNITY): Payer: Self-pay

## 2023-01-15 MED ORDER — METHYLPHENIDATE HCL ER (CD) 30 MG PO CPCR
30.0000 mg | ORAL_CAPSULE | Freq: Every day | ORAL | 0 refills | Status: DC
Start: 2023-01-15 — End: 2023-03-04
  Filled 2023-01-15: qty 30, 30d supply, fill #0

## 2023-01-16 ENCOUNTER — Other Ambulatory Visit (HOSPITAL_COMMUNITY): Payer: Self-pay

## 2023-01-17 DIAGNOSIS — E559 Vitamin D deficiency, unspecified: Secondary | ICD-10-CM | POA: Diagnosis not present

## 2023-01-17 DIAGNOSIS — E782 Mixed hyperlipidemia: Secondary | ICD-10-CM | POA: Diagnosis not present

## 2023-01-17 DIAGNOSIS — K760 Fatty (change of) liver, not elsewhere classified: Secondary | ICD-10-CM | POA: Diagnosis not present

## 2023-01-17 DIAGNOSIS — R632 Polyphagia: Secondary | ICD-10-CM | POA: Diagnosis not present

## 2023-01-17 DIAGNOSIS — E669 Obesity, unspecified: Secondary | ICD-10-CM | POA: Diagnosis not present

## 2023-01-17 DIAGNOSIS — Z6833 Body mass index (BMI) 33.0-33.9, adult: Secondary | ICD-10-CM | POA: Diagnosis not present

## 2023-01-18 ENCOUNTER — Other Ambulatory Visit (HOSPITAL_COMMUNITY): Payer: Self-pay

## 2023-01-21 ENCOUNTER — Encounter: Payer: Self-pay | Admitting: Internal Medicine

## 2023-01-25 ENCOUNTER — Encounter (HOSPITAL_BASED_OUTPATIENT_CLINIC_OR_DEPARTMENT_OTHER): Payer: Self-pay | Admitting: Pulmonary Disease

## 2023-01-25 ENCOUNTER — Ambulatory Visit (HOSPITAL_BASED_OUTPATIENT_CLINIC_OR_DEPARTMENT_OTHER): Payer: Commercial Managed Care - PPO | Admitting: Pulmonary Disease

## 2023-01-25 VITALS — BP 120/72 | HR 65 | Resp 16 | Ht 72.0 in | Wt 251.6 lb

## 2023-01-25 DIAGNOSIS — G4733 Obstructive sleep apnea (adult) (pediatric): Secondary | ICD-10-CM

## 2023-01-25 NOTE — Progress Notes (Signed)
[facility]  Chief Complaint  Patient presents with   Follow-up    Using CPAP nightly. No new issues.     Past Surgical History:  He  has a past surgical history that includes Vasectomy; Elbow surgery (Left); and Knee surgery.  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 male with obstructive sleep apnea.      Subjective:   He uses CPAP nightly.  No issues with mask fit or pressure setting.  Sleeping well.  Feels rested.  Not having sinus congestion or dry mouth.  Physical Exam:   Appearance - well kempt   ENMT - no sinus tenderness, no oral exudate, no LAN, Mallampati 3 airway, no stridor  Respiratory - equal breath sounds bilaterally, no wheezing or rales  CV - s1s2 regular rate and rhythm, no murmurs  Ext - no clubbing, no edema  Skin - no rashes  Psych - normal mood and affect   Sleep Tests:  HST 12/27/19 >> AHI 22.2, SpO2 low 83% Auto CPAP 12/25/22 to 01/23/23 >> used on 30 of 30 nights with average 7 hrs 43 min.  Average AHI 0.7 with median CPAP 6 and 95 th percentile CPAP 8 cm H2O  Social History:  He  reports that he has never smoked. He has never been exposed to tobacco smoke. He has never used smokeless tobacco. He reports current alcohol use. He reports that he does not use drugs.  Family History:  His family history includes Anxiety disorder in his mother.     Assessment/Plan:   Obstructive sleep apnea. - he is compliant with CPAP and reports benefit from therapy - he uses [employer] for his DME - current CPAP ordered September 2021 - continue auto CPAP 6 to 20 cm H2O  Time Spent Involved in Patient Care on Day of Examination:  15 minutes  Follow up:   Patient  Instructions  Follow up in 1 year  Medication List:   Allergies as of 01/25/2023       Reactions   Sulfa Antibiotics Nausea And Vomiting        Medication List        Accurate as of January 25, 2023  2:06 PM. If you have any questions, ask your nurse or doctor.          STOP taking these medications    Qulipta 60 MG Tabs Generic drug: Atogepant Stopped by: Coralyn Helling   Topiramate ER 50 MG Cp24 Commonly known as: Trokendi XR Stopped by: Coralyn Helling       TAKE these medications    amLODipine 10 MG tablet Commonly known as: NORVASC Take 1 tablet (10 mg total) by mouth daily.   betamethasone valerate 0.1 % cream Commonly known as: VALISONE Apply 1 Application topically daily.   cetirizine 10 MG tablet Commonly known as: ZYRTEC Take 10 mg by mouth daily.   desonide 0.05 % ointment Commonly known as: DESOWEN Apply 1 Application topically to eyelids  (two) times daily for 7 days.   Febuxostat 80 MG Tabs Take 1 tablet (80 mg total) by mouth daily.   gabapentin 300 MG capsule Commonly  known as: NEURONTIN Take 1 capsule (300 mg total) by mouth at bedtime.   hydrochlorothiazide 25 MG tablet Commonly known as: HYDRODIURIL Take 1 tablet (25 mg total) by mouth daily.   methylphenidate 30 MG CR capsule Commonly known as: METADATE CD Take 1 capsule (30 mg total) by mouth daily.   pantoprazole 40 MG tablet Commonly known as: PROTONIX Take 1 tablet by mouth daily.   rosuvastatin 5 MG tablet Commonly known as: CRESTOR Take 1 tablet (5 mg total) by mouth daily.   tadalafil 20 MG tablet Commonly known as: CIALIS Take 1 tablet (20 mg total) by mouth daily as needed for erectile dysfunction (please use troches).   traZODone 50 MG tablet Commonly known as: DESYREL Take 1 to 2 tablets by mouth at bedtime.   triamcinolone cream 0.1 % Commonly known as: KENALOG Apply 1 Application topically 2 (two) times daily.   Vitamin D (Ergocalciferol) 1.25 MG  (50000 UNIT) Caps capsule Commonly known as: DRISDOL Take 1 capsule (50,000 Units total) by mouth once a week.   Zepbound 10 MG/0.5ML Pen Generic drug: tirzepatide Inject 10 mg into the skin once a week.        Signature:  Coralyn Helling, MD [facility] Pager - [phone] 01/25/2023, 2:06 PM

## 2023-01-25 NOTE — Patient Instructions (Addendum)
Follow up in 1 year with Dr. Vassie Loll or Dr. Wynona Neat

## 2023-02-01 DIAGNOSIS — L209 Atopic dermatitis, unspecified: Secondary | ICD-10-CM | POA: Diagnosis not present

## 2023-02-10 ENCOUNTER — Other Ambulatory Visit (HOSPITAL_COMMUNITY): Payer: Self-pay

## 2023-02-10 MED ORDER — ROSUVASTATIN CALCIUM 5 MG PO TABS
5.0000 mg | ORAL_TABLET | Freq: Every day | ORAL | 3 refills | Status: DC
  Filled 2023-02-10: qty 90, 90d supply, fill #0
  Filled 2023-08-11: qty 90, 90d supply, fill #1
  Filled 2023-11-16: qty 90, 90d supply, fill #2

## 2023-02-11 ENCOUNTER — Other Ambulatory Visit (HOSPITAL_COMMUNITY): Payer: Self-pay

## 2023-02-11 ENCOUNTER — Other Ambulatory Visit: Payer: Self-pay

## 2023-02-13 ENCOUNTER — Encounter: Payer: Self-pay | Admitting: Internal Medicine

## 2023-02-13 NOTE — Progress Notes (Unsigned)
    Subjective:    Patient ID: Andrew Gray, male    DOB: 29-Dec-1980, 42 y.o.   MRN: [id number]      HPI Andrew Gray is here for No chief complaint on file.    ? Internal hemorrhoid - would like banding    Medications and allergies reviewed with patient and updated if appropriate.  Current Outpatient Medications on File Prior to Visit  Medication Sig Dispense Refill   amLODipine (NORVASC) 10 MG tablet Take 1 tablet (10 mg total) by mouth daily. 90 tablet 3   betamethasone valerate (VALISONE) 0.1 % cream Apply 1 Application topically daily. 45 g 0   cetirizine (ZYRTEC) 10 MG tablet Take 10 mg by mouth daily.     desonide (DESOWEN) 0.05 % ointment Apply 1 Application topically to eyelids  (two) times daily for 7 days. 30 g 0   Febuxostat 80 MG TABS Take 1 tablet (80 mg total) by mouth daily. 90 tablet 3   gabapentin (NEURONTIN) 300 MG capsule Take 1 capsule (300 mg total) by mouth at bedtime. 90 capsule 3   hydrochlorothiazide (HYDRODIURIL) 25 MG tablet Take 1 tablet (25 mg total) by mouth daily. 90 tablet 3   methylphenidate (METADATE CD) 30 MG CR capsule Take 1 capsule (30 mg total) by mouth daily. 30 capsule 0   pantoprazole (PROTONIX) 40 MG tablet Take 1 tablet by mouth daily. 90 tablet 3   rosuvastatin (CRESTOR) 5 MG tablet Take 1 tablet (5 mg total) by mouth daily. 90 tablet 3   tadalafil (CIALIS) 20 MG tablet Take 1 tablet (20 mg total) by mouth daily as needed for erectile dysfunction (please use troches). 30 tablet 11   tirzepatide (ZEPBOUND) 10 MG/0.5ML Pen Inject 10 mg into the skin once a week. 2 mL 6   traZODone (DESYREL) 50 MG tablet Take 1 to 2 tablets by mouth at bedtime. 60 tablet 3   triamcinolone cream (KENALOG) 0.1 % Apply 1 Application topically 2 (two) times daily. 30 g 0   Vitamin D, Ergocalciferol, (DRISDOL) 1.25 MG (50000 UNIT) CAPS capsule Take 1 capsule (50,000 Units total) by mouth once a week. 12 capsule 0   No current facility-administered medications on file  prior to visit.    Review of Systems     Objective:  There were no vitals filed for this visit. BP Readings from Last 3 Encounters:  01/25/23 120/72  11/12/22 118/82  07/27/22 122/80   Wt Readings from Last 3 Encounters:  01/25/23 251 lb 9.6 oz (114.1 kg)  11/12/22 270 lb (122.5 kg)  07/27/22 248 lb (112.5 kg)   There is no height or weight on file to calculate BMI.    Physical Exam         Assessment & Plan:    See Problem List for Assessment and Plan of chronic medical problems.

## 2023-02-13 NOTE — Patient Instructions (Incomplete)
      Blood work was ordered.   The lab is on the first floor.    Medications changes include :       A referral was ordered for GI and someone will call you to schedule an appointment.

## 2023-02-14 ENCOUNTER — Encounter: Payer: Self-pay | Admitting: Gastroenterology

## 2023-02-14 ENCOUNTER — Ambulatory Visit: Payer: Commercial Managed Care - PPO | Admitting: Internal Medicine

## 2023-02-14 VITALS — BP 120/78 | HR 67 | Temp 98.5°F | Ht 72.0 in | Wt 256.0 lb

## 2023-02-14 DIAGNOSIS — K648 Other hemorrhoids: Secondary | ICD-10-CM | POA: Diagnosis not present

## 2023-02-14 NOTE — Assessment & Plan Note (Signed)
Acute Prolapsed hemorrhoid x 2-3 weeks Currently no bleeding or pain Using hydrocortisone cream Not going away Interested in banding Referred to GI

## 2023-02-21 ENCOUNTER — Other Ambulatory Visit (HOSPITAL_COMMUNITY): Payer: Self-pay

## 2023-02-26 ENCOUNTER — Other Ambulatory Visit (HOSPITAL_COMMUNITY): Payer: Self-pay

## 2023-03-04 ENCOUNTER — Other Ambulatory Visit (HOSPITAL_COMMUNITY): Payer: Self-pay

## 2023-03-04 DIAGNOSIS — E559 Vitamin D deficiency, unspecified: Secondary | ICD-10-CM | POA: Diagnosis not present

## 2023-03-04 DIAGNOSIS — K760 Fatty (change of) liver, not elsewhere classified: Secondary | ICD-10-CM | POA: Diagnosis not present

## 2023-03-04 DIAGNOSIS — E782 Mixed hyperlipidemia: Secondary | ICD-10-CM | POA: Diagnosis not present

## 2023-03-04 DIAGNOSIS — Z1589 Genetic susceptibility to other disease: Secondary | ICD-10-CM | POA: Diagnosis not present

## 2023-03-04 MED ORDER — METHYLPHENIDATE HCL ER (CD) 30 MG PO CPCR
30.0000 mg | ORAL_CAPSULE | Freq: Every day | ORAL | 0 refills | Status: DC
  Filled 2023-03-04: qty 30, 30d supply, fill #0

## 2023-03-05 ENCOUNTER — Encounter: Payer: Self-pay | Admitting: Internal Medicine

## 2023-03-06 ENCOUNTER — Other Ambulatory Visit (HOSPITAL_COMMUNITY): Payer: Self-pay

## 2023-04-01 ENCOUNTER — Other Ambulatory Visit: Payer: Self-pay

## 2023-04-01 ENCOUNTER — Other Ambulatory Visit (HOSPITAL_COMMUNITY): Payer: Self-pay

## 2023-04-01 DIAGNOSIS — E782 Mixed hyperlipidemia: Secondary | ICD-10-CM | POA: Diagnosis not present

## 2023-04-01 DIAGNOSIS — Z6834 Body mass index (BMI) 34.0-34.9, adult: Secondary | ICD-10-CM | POA: Diagnosis not present

## 2023-04-01 DIAGNOSIS — E559 Vitamin D deficiency, unspecified: Secondary | ICD-10-CM | POA: Diagnosis not present

## 2023-04-01 DIAGNOSIS — Z1589 Genetic susceptibility to other disease: Secondary | ICD-10-CM | POA: Diagnosis not present

## 2023-04-01 DIAGNOSIS — E66811 Obesity, class 1: Secondary | ICD-10-CM | POA: Diagnosis not present

## 2023-04-01 DIAGNOSIS — K760 Fatty (change of) liver, not elsewhere classified: Secondary | ICD-10-CM | POA: Diagnosis not present

## 2023-04-01 MED ORDER — ROSUVASTATIN CALCIUM 5 MG PO TABS
5.0000 mg | ORAL_TABLET | Freq: Every day | ORAL | 3 refills | Status: DC
  Filled 2023-05-12: qty 90, 90d supply, fill #0

## 2023-04-01 MED ORDER — VITAMIN D (ERGOCALCIFEROL) 1.25 MG (50000 UNIT) PO CAPS
50000.0000 [IU] | ORAL_CAPSULE | ORAL | 1 refills | Status: DC
  Filled 2023-04-01: qty 12, 84d supply, fill #0
  Filled 2023-06-29: qty 12, 84d supply, fill #1

## 2023-04-12 ENCOUNTER — Other Ambulatory Visit (HOSPITAL_COMMUNITY): Payer: Self-pay

## 2023-04-12 MED ORDER — METHYLPHENIDATE HCL ER (CD) 30 MG PO CPCR
30.0000 mg | ORAL_CAPSULE | Freq: Every day | ORAL | 0 refills | Status: DC
  Filled 2023-04-12: qty 30, 30d supply, fill #0

## 2023-04-15 DIAGNOSIS — G4733 Obstructive sleep apnea (adult) (pediatric): Secondary | ICD-10-CM | POA: Diagnosis not present

## 2023-04-16 ENCOUNTER — Other Ambulatory Visit (HOSPITAL_COMMUNITY): Payer: Self-pay

## 2023-05-10 ENCOUNTER — Other Ambulatory Visit (HOSPITAL_COMMUNITY): Payer: Self-pay

## 2023-05-10 DIAGNOSIS — F902 Attention-deficit hyperactivity disorder, combined type: Secondary | ICD-10-CM | POA: Diagnosis not present

## 2023-05-10 DIAGNOSIS — Z79899 Other long term (current) drug therapy: Secondary | ICD-10-CM | POA: Diagnosis not present

## 2023-05-10 MED ORDER — METHYLPHENIDATE HCL ER (CD) 30 MG PO CPCR
30.0000 mg | ORAL_CAPSULE | Freq: Every day | ORAL | 0 refills | Status: DC
  Filled 2023-05-11 – 2023-05-13 (×3): qty 30, 30d supply, fill #0

## 2023-05-11 ENCOUNTER — Other Ambulatory Visit (HOSPITAL_COMMUNITY): Payer: Self-pay

## 2023-05-12 ENCOUNTER — Other Ambulatory Visit (HOSPITAL_COMMUNITY): Payer: Self-pay

## 2023-05-13 ENCOUNTER — Other Ambulatory Visit (HOSPITAL_COMMUNITY): Payer: Self-pay

## 2023-05-24 ENCOUNTER — Encounter: Payer: Self-pay | Admitting: Gastroenterology

## 2023-05-24 ENCOUNTER — Ambulatory Visit: Payer: Commercial Managed Care - PPO | Admitting: Gastroenterology

## 2023-05-24 VITALS — BP 128/68 | HR 67 | Ht 72.0 in | Wt 271.0 lb

## 2023-05-24 DIAGNOSIS — K649 Unspecified hemorrhoids: Secondary | ICD-10-CM

## 2023-05-24 DIAGNOSIS — K921 Melena: Secondary | ICD-10-CM | POA: Diagnosis not present

## 2023-05-24 MED ORDER — SUFLAVE 178.7 G PO SOLR: 1.0000 | Freq: Once | ORAL | 0 refills | Status: AC

## 2023-05-24 NOTE — Patient Instructions (Addendum)
_______________________________________________________  If your blood pressure at your visit was 140/90 or greater, please contact your primary care physician to follow up on this.  If you are age 43 or younger, your body mass index should be between 19-25. Your Body mass index is 36.75 kg/m. If this is out of the aformentioned range listed, please consider follow up with your Primary Care Provider.  ________________________________________________________  The [facility] providers would like to encourage you to use [facility] to communicate with providers for non-urgent requests or questions.  Due to long hold times on the telephone, sending your provider a message by [facility] may be a faster and more efficient way to get a response.  Please allow 48 business hours for a response.  Please remember that this is for non-urgent requests.  _______________________________________________________  Bonita Quin have been scheduled for a colonoscopy. Please follow written instructions given to you at your visit today.   You will receive your bowel preparation through [facility], which ensures the lowest copay and home delivery, with outreach via text or call from an [phone] number. Please respond promptly to avoid rescheduling. If you are interested in alternative options or have any questions please contact them at [phone]  Your Provider Has Sent Your Bowel Prep Regimen To [facility] What to expect. [facility] will contact you to verify your information and collect your copay, if applicable. Enjoy the comfort of your home while we deliver your prescription to you, free of any shipping charges. Fast, FREE delivery or shipping. [facility] accepts all major insurance benefits and applies discounts & coupons  Have additional questions? [facility]'s patient care team is always here to help.  Chat: [email or web address] Call: [phone] Email: [email or web address] NCPDP: [id number] How will we contact  you? Welcome Phone call  a Welcome text and a Checkout link in a text Texts you receive from [phone] Are Not Spam.  *To set up delivery, you must complete the checkout process via link or speak to one of our patient care representatives. If we are unable to reach you, your prescription may be delayed.  If you use inhalers (even only as needed), please bring them with you on the day of your procedure.  DO NOT TAKE 7 DAYS PRIOR TO TEST- Trulicity (dulaglutide) Ozempic, Wegovy (semaglutide) Mounjaro (tirzepatide) Bydureon Bcise (exanatide extended release)  DO NOT TAKE 1 DAY PRIOR TO YOUR TEST Rybelsus (semaglutide) Adlyxin (lixisenatide) Victoza (liraglutide) Byetta (exanatide) ___________________________________________________________________________  Due to recent changes in healthcare laws, you may see the results of your imaging and laboratory studies on MyChart before your provider has had a chance to review them.  We understand that in some cases there may be results that are confusing or concerning to you. Not all laboratory results come back in the same time frame and the provider may be waiting for multiple results in order to interpret others.  Please give [place] 48 hours in order for your provider to thoroughly review all the results before contacting the office for clarification of your results.   It was a pleasure to see you today!  Vito Cirigliano, D.O.

## 2023-05-24 NOTE — Progress Notes (Signed)
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  History of intermittently symptomatic hemorrhoids, described as prolapsing but reducible.  Will have intermittent BRB on tissue paper.  No pain.  Hemorrhoidal symptoms were most symptomatic about 3 months ago, had tried topical hydrocortisone, but no appreciable improvement.  Symptoms eventually resolved.  Hemorrhoidal symptoms worse when he was having diarrhea attributed to Zepbound.  Has since stopped Zepbound and diarrhea resolved.  He is interested in hemorrhoid banding.  Reviewed outside labs from 10/2022, notable for AST/ALT 39/69, ALP 46, T. bili 0.8.  Remainder of CMP normal.  Vitamin D 19.1.  Normal CBC, B12, A1c.  No recent abdominal imaging for review.   No previous EGD or colonoscopy.  No known family history of CRC, GI malignancy, liver disease, pancreatic disease, or IBD.     Past Medical History:  Diagnosis Date   ADHD    Back pain    Fatty liver    GERD (gastroesophageal reflux disease)    Gout    Hypertension    Joint pain    Lactose intolerance    OSA (obstructive sleep apnea)    Seasonal allergies      Past Surgical History:  Procedure Laterality Date   ELBOW SURGERY Left    KNEE SURGERY     VASECTOMY     Family History  Problem Relation Age of Onset   Anxiety disorder Mother    Social History   Tobacco Use   Smoking status: Never    Passive exposure: Never   Smokeless tobacco: Never  Substance Use Topics   Alcohol use: Yes    Alcohol/week: 0.0 standard drinks of alcohol   Drug use: No   Current Outpatient Medications  Medication Sig Dispense Refill   amLODipine (NORVASC) 10 MG tablet Take 1 tablet (10 mg total) by mouth daily. 90 tablet 3   betamethasone  valerate (VALISONE) 0.1 % cream Apply 1 Application topically daily. 45 g 0   cetirizine (ZYRTEC) 10 MG tablet Take 10 mg by mouth daily.     desonide (DESOWEN) 0.05 % ointment Apply 1 Application topically to eyelids  (two) times daily for 7 days. 30 g 0   Febuxostat 80 MG TABS Take 1 tablet (80 mg total) by mouth daily. 90 tablet 3   gabapentin (NEURONTIN) 300 MG capsule Take 1 capsule (300 mg total) by mouth at bedtime. 90 capsule 3   hydrochlorothiazide (HYDRODIURIL) 25 MG tablet Take 1 tablet (25 mg total) by mouth daily. 90 tablet 3   methylphenidate (METADATE CD) 30 MG CR capsule Take 1 capsule by mouth daily 30 capsule 0   pantoprazole (PROTONIX) 40 MG tablet Take 1 tablet by mouth daily. 90 tablet 3   rosuvastatin (CRESTOR) 5 MG tablet Take 1 tablet (5 mg total) by mouth daily. 90 tablet 3   rosuvastatin (CRESTOR) 5 MG tablet Take 1 tablet (5 mg total) by mouth daily. 90 tablet 3   tadalafil (CIALIS) 20 MG tablet Take 1 tablet (20 mg total) by mouth daily as needed for erectile dysfunction (please use troches). 30 tablet 11   tirzepatide (ZEPBOUND) 10 MG/0.5ML Pen Inject 10 mg into the skin once a week.  2 mL 6   traZODone (DESYREL) 50 MG tablet Take 1 to 2 tablets by mouth at bedtime. 60 tablet 3   triamcinolone cream (KENALOG) 0.1 % Apply 1 Application topically 2 (two) times daily. 30 g 0   Vitamin D, Ergocalciferol, (DRISDOL) 1.25 MG (50000 UNIT) CAPS capsule Take 1 capsule (50,000 Units total) by mouth once a week. 12 capsule 0   Vitamin D, Ergocalciferol, (DRISDOL) 1.25 MG (50000 UNIT) CAPS capsule Take 1 capsule (50,000 Units total) by mouth once a week. 12 capsule 1   No current facility-administered medications for this visit.   Allergies  Allergen Reactions   Sulfa Antibiotics Nausea And Vomiting     Review of Systems: All systems reviewed and negative except where noted in HPI.     Physical Exam:    Wt Readings from Last 3 Encounters:  02/14/23 256 lb (116.1 kg)   01/25/23 251 lb 9.6 oz (114.1 kg)  11/12/22 270 lb (122.5 kg)    There were no vitals taken for this visit. Constitutional:  Pleasant, in no acute distress. Psychiatric: Normal mood and affect. Behavior is normal. Neurological: Alert and oriented to person place and time. Skin: Skin is warm and dry. No rashes noted. Rectal: Exam deferred to time of colonoscopy.    ASSESSMENT AND PLAN;   1) Symptomatic hemorrhoids 2) Hematochezia Discussed pathophysiology of hemorrhoids today.  Discussed role/utility of hemorrhoid banding.  Discussed other potential etiologies for symptoms, particularly with intermittent BRB, with plan for the following:  - Colonoscopy to evaluate for additional mucosal/luminal pathology - Evaluate grade/severity of hemorrhoids at time of colonoscopy - If planning on resuming Zepbound, will need to hold 1 week prior to bowel prep - Will schedule colonoscopy to be done on a Monday per patient request - Plan for hemorrhoid banding depending on hemorrhoidal appearance at time of colonoscopy, symptomatology, etc.  The indications, risks, and benefits of colonoscopy were explained to the patient in detail. Risks include but are not limited to bleeding, perforation, adverse reaction to medications, and cardiopulmonary compromise. Sequelae include but are not limited to the possibility of surgery, hospitalization, and mortality. The patient verbalized understanding and wished to proceed. All questions answered, referred to the scheduler and bowel prep ordered. Further recommendations pending results of the exam.     Andrew Cleverly, DO, FACG  05/24/2023, 12:52 PM   Burns, Andrew Mo, MD

## 2023-05-25 ENCOUNTER — Other Ambulatory Visit (HOSPITAL_COMMUNITY): Payer: Self-pay

## 2023-05-30 ENCOUNTER — Telehealth: Payer: Self-pay | Admitting: Nurse Practitioner

## 2023-05-30 ENCOUNTER — Encounter: Payer: Self-pay | Admitting: Internal Medicine

## 2023-05-30 DIAGNOSIS — G4733 Obstructive sleep apnea (adult) (pediatric): Secondary | ICD-10-CM

## 2023-05-30 NOTE — Progress Notes (Signed)
Subjective:    Patient ID: Andrew Gray, male    DOB: 01/18/1981, 43 y.o.   MRN: [id number]      HPI Andrew Gray is here for  Chief Complaint  Patient presents with   Anxiety    Ongoing, has started worsening about 2 months ago     Anxiety -has been feeling anxious for a little while-worse in the past 2 months.  It is affecting his sleep.  He has not had any issues with depression.  Denies any chest pain or palpitations.  He is interested in taking medication.    Medications and allergies reviewed with patient and updated if appropriate.  Current Outpatient Medications on File Prior to Visit  Medication Sig Dispense Refill   amLODipine (NORVASC) 10 MG tablet Take 1 tablet (10 mg total) by mouth daily. 90 tablet 3   betamethasone valerate (VALISONE) 0.1 % cream Apply 1 Application topically daily. 45 g 0   cetirizine (ZYRTEC) 10 MG tablet Take 10 mg by mouth daily.     desonide (DESOWEN) 0.05 % ointment Apply 1 Application topically to eyelids  (two) times daily for 7 days. 30 g 0   Febuxostat 80 MG TABS Take 1 tablet (80 mg total) by mouth daily. 90 tablet 3   gabapentin (NEURONTIN) 300 MG capsule Take 1 capsule (300 mg total) by mouth at bedtime. 90 capsule 3   hydrochlorothiazide (HYDRODIURIL) 25 MG tablet Take 1 tablet (25 mg total) by mouth daily. 90 tablet 3   methylphenidate (METADATE CD) 30 MG CR capsule Take 1 capsule by mouth daily 30 capsule 0   pantoprazole (PROTONIX) 40 MG tablet Take 1 tablet by mouth daily. 90 tablet 3   rosuvastatin (CRESTOR) 5 MG tablet Take 1 tablet (5 mg total) by mouth daily. 90 tablet 3   rosuvastatin (CRESTOR) 5 MG tablet Take 1 tablet (5 mg total) by mouth daily. 90 tablet 3   tadalafil (CIALIS) 20 MG tablet Take 1 tablet (20 mg total) by mouth daily as needed for erectile dysfunction (please use troches). 30 tablet 11   traZODone (DESYREL) 50 MG tablet Take 1 to 2 tablets by mouth at bedtime. 60 tablet 3   triamcinolone cream (KENALOG) 0.1 %  Apply 1 Application topically 2 (two) times daily. 30 g 0   Vitamin D, Ergocalciferol, (DRISDOL) 1.25 MG (50000 UNIT) CAPS capsule Take 1 capsule (50,000 Units total) by mouth once a week. 12 capsule 0   Vitamin D, Ergocalciferol, (DRISDOL) 1.25 MG (50000 UNIT) CAPS capsule Take 1 capsule (50,000 Units total) by mouth once a week. 12 capsule 1   No current facility-administered medications on file prior to visit.    Review of Systems     Objective:   Vitals:   05/31/23 1305  BP: 138/70  Pulse: 60  SpO2: 98%   BP Readings from Last 3 Encounters:  05/31/23 138/70  05/24/23 128/68  02/14/23 120/78   Wt Readings from Last 3 Encounters:  05/31/23 273 lb (123.8 kg)  05/24/23 271 lb (122.9 kg)  02/14/23 256 lb (116.1 kg)   Body mass index is 37.03 kg/m.    Physical Exam Constitutional:      General: He is not in acute distress.    Appearance: Normal appearance. He is not ill-appearing.  HENT:     Head: Normocephalic and atraumatic.  Skin:    General: Skin is warm and dry.  Neurological:     Mental Status: He is alert. Mental status is at  baseline.  Psychiatric:        Mood and Affect: Mood normal.        Behavior: Behavior normal.        Thought Content: Thought content normal.        Judgment: Judgment normal.            Assessment & Plan:    See Problem List for Assessment and Plan of chronic medical problems.

## 2023-05-30 NOTE — Telephone Encounter (Signed)
05/30/2023: Pt contacted office. Former pt of Dr. Evlyn Courier and last seen 01/2023. I last saw him 01/2022. Followed for OSA on CPAP with excellent compliance/control. He would like a prescription for a travel CPAP machine. Rx placed and provided to pt. All questions addressed. Aware of proper care/use.

## 2023-05-31 ENCOUNTER — Ambulatory Visit: Payer: Commercial Managed Care - PPO | Admitting: Internal Medicine

## 2023-05-31 ENCOUNTER — Encounter: Payer: Self-pay | Admitting: Internal Medicine

## 2023-05-31 ENCOUNTER — Other Ambulatory Visit: Payer: Self-pay

## 2023-05-31 VITALS — BP 138/70 | HR 60 | Ht 72.0 in | Wt 273.0 lb

## 2023-05-31 DIAGNOSIS — I10 Essential (primary) hypertension: Secondary | ICD-10-CM | POA: Diagnosis not present

## 2023-05-31 DIAGNOSIS — F419 Anxiety disorder, unspecified: Secondary | ICD-10-CM | POA: Insufficient documentation

## 2023-05-31 MED ORDER — FLUOXETINE HCL 20 MG PO CAPS
20.0000 mg | ORAL_CAPSULE | Freq: Every day | ORAL | 5 refills | Status: DC
  Filled 2023-05-31: qty 30, 30d supply, fill #0
  Filled 2023-06-29: qty 30, 30d supply, fill #1
  Filled 2023-07-28: qty 30, 30d supply, fill #2
  Filled 2023-08-25: qty 30, 30d supply, fill #3
  Filled 2023-09-24: qty 30, 30d supply, fill #4
  Filled 2023-10-30: qty 30, 30d supply, fill #5

## 2023-05-31 NOTE — Assessment & Plan Note (Signed)
No Has been going on for a little while-worse over the past 2 months Is affecting his sleep Interested in taking medication Start fluoxetine 20 mg daily-will titrate as needed

## 2023-05-31 NOTE — Patient Instructions (Signed)
         Medications changes include :   prozac 20 mg daily

## 2023-06-06 DIAGNOSIS — K760 Fatty (change of) liver, not elsewhere classified: Secondary | ICD-10-CM | POA: Diagnosis not present

## 2023-06-06 DIAGNOSIS — R632 Polyphagia: Secondary | ICD-10-CM | POA: Diagnosis not present

## 2023-06-06 DIAGNOSIS — Z6836 Body mass index (BMI) 36.0-36.9, adult: Secondary | ICD-10-CM | POA: Diagnosis not present

## 2023-06-06 DIAGNOSIS — E782 Mixed hyperlipidemia: Secondary | ICD-10-CM | POA: Diagnosis not present

## 2023-06-06 DIAGNOSIS — G4733 Obstructive sleep apnea (adult) (pediatric): Secondary | ICD-10-CM | POA: Diagnosis not present

## 2023-06-06 DIAGNOSIS — E66812 Obesity, class 2: Secondary | ICD-10-CM | POA: Diagnosis not present

## 2023-06-12 ENCOUNTER — Encounter: Payer: Self-pay | Admitting: Internal Medicine

## 2023-06-13 ENCOUNTER — Telehealth: Payer: Self-pay | Admitting: Sports Medicine

## 2023-06-13 ENCOUNTER — Other Ambulatory Visit: Payer: Self-pay | Admitting: Sports Medicine

## 2023-06-13 DIAGNOSIS — G8929 Other chronic pain: Secondary | ICD-10-CM

## 2023-06-13 DIAGNOSIS — M4317 Spondylolisthesis, lumbosacral region: Secondary | ICD-10-CM

## 2023-06-13 NOTE — Telephone Encounter (Signed)
 Action items: Can we order left-sided L5-S1 epidural CSI  Patient's low back pain with left-sided radicular symptoms has returned identically to prior.  No new MOI.  We will order repeat epidural CSI for left-sided L5-S1 which has been significantly beneficial for patient in the past.

## 2023-06-13 NOTE — Telephone Encounter (Signed)
Patient notified referral has been placed.

## 2023-06-13 NOTE — Progress Notes (Signed)
 Epidural placed , Pt referral placed

## 2023-06-14 ENCOUNTER — Encounter: Payer: Self-pay | Admitting: Rehabilitative and Restorative Service Providers"

## 2023-06-14 ENCOUNTER — Ambulatory Visit: Payer: Commercial Managed Care - PPO | Admitting: Rehabilitative and Restorative Service Providers"

## 2023-06-14 DIAGNOSIS — M5416 Radiculopathy, lumbar region: Secondary | ICD-10-CM | POA: Diagnosis not present

## 2023-06-14 DIAGNOSIS — R293 Abnormal posture: Secondary | ICD-10-CM | POA: Diagnosis not present

## 2023-06-14 DIAGNOSIS — M5459 Other low back pain: Secondary | ICD-10-CM

## 2023-06-14 NOTE — Therapy (Signed)
 OUTPATIENT PHYSICAL THERAPY THORACOLUMBAR EVALUATION   Patient Name: MAXXWELL EDGETT MRN: [id number] DOB:1980/12/23, 43 y.o., male Today's Date: 06/14/2023  END OF SESSION:  PT End of Session - 06/14/23 1623     Visit Number 1    Number of Visits 11    Date for PT Re-Evaluation 08/09/23    Progress Note Due on Visit 11    PT Start Time 1518    PT Stop Time 1610    PT Time Calculation (min) 52 min    Activity Tolerance Patient tolerated treatment well;No increased pain;Patient limited by pain    Behavior During Therapy [facility] for tasks assessed/performed             Past Medical History:  Diagnosis Date   ADHD    Back pain    Fatty liver    GERD (gastroesophageal reflux disease)    Gout    Hypertension    Joint pain    Lactose intolerance    OSA (obstructive sleep apnea)    Seasonal allergies    Sleep apnea    Past Surgical History:  Procedure Laterality Date   ELBOW SURGERY Left    KNEE SURGERY     VASECTOMY     Patient Active Problem List   Diagnosis Date Noted   Anxiety 05/31/2023   Hemorrhoid prolapse 02/14/2023   Rash 11/12/2022   GERD (gastroesophageal reflux disease) 11/11/2022   Hepatic steatosis 04/08/2020   Obesity (BMI 30-39.9) 03/01/2020   OSA (obstructive sleep apnea) 12/30/2019   Right hip osteoarthritis and labral tearing 01/23/2018   Rosacea 10/11/2017   Adult ADHD 10/11/2017   Hyperlipidemia 02/23/2015   Essential hypertension, benign 02/23/2015   Gout 02/16/2015   Spondylolisthesis at L5-S1 level 02/15/2015    PCP: Glade JINNY Hope, MD  REFERRING PROVIDER: Morene Mace, DO  REFERRING DIAG: [phone] (ICD-10-CM) - Spondylolisthesis at L5-S1 level M54.42,G89.29 (ICD-10-CM) - Chronic left-sided low back pain with left-sided sciatica  Rationale for Evaluation and Treatment: Rehabilitation  THERAPY DIAG:  Abnormal posture - Plan: PT plan of care cert/re-cert  Radiculopathy, lumbar region - Plan: PT plan of care cert/re-cert  Other low  back pain - Plan: PT plan of care cert/re-cert  ONSET DATE: Chronic (at least 2019) with left sided symptoms worsening over the past few weeks.  SUBJECTIVE:                                                                                                                                                                                           SUBJECTIVE STATEMENT: Zykee notes left lateral calf pain of at least 6 years duration.  Symptoms started  getting worse over the last few weeks with no specific reason for exacerbation noted.  He treadmill walks for exercise.  Pain can be bad at night.  Hamstrings tightness is noted.  PERTINENT HISTORY:  HTN, Gout, Lt elbow and knee surgeries  PAIN:  Are you having pain? Yes: NPRS scale: 0-5/10 Pain location: Left lateral thigh Pain description: Painful (toothache) Aggravating factors: Leg up, at night Relieving factors: Some relief with gabapentin   PRECAUTIONS: Back  RED FLAGS: None   WEIGHT BEARING RESTRICTIONS: No  FALLS:  Has patient fallen in last 6 months? No  OCCUPATION: Sports Medicine MD  PLOF: Independent  PATIENT GOALS: Get rid of left lateral calf pain and improve low back stiffness and pain  NEXT MD VISIT: ?  OBJECTIVE:  Note: Objective measures were completed at Evaluation unless otherwise noted.  DIAGNOSTIC FINDINGS:  From 2019 MRI:  No change in bilateral L5 pars interarticularis defects resulting in 0.7 cm anterolisthesis L5 on S1. Moderately severe left foraminal narrowing at L5-S1 due to anterolisthesis and a small disc protrusion has worsened since the prior examination. The exiting left L5 root appears compressed in the foramen. Mild to moderate right foraminal narrowing appears unchanged.   Annular fissure and very shallow central protrusion at L4-5 without stenosis, unchanged.  PATIENT SURVEYS:  FOTO 60 (risk-adjusted 51, Goal 68 in 11 visits)  COGNITION: Overall cognitive status: Within functional  limits for tasks assessed     SENSATION: Significant for left lateral calf pain  MUSCLE LENGTH: Hamstrings: Right 35 deg; Left 35 deg  POSTURE: rounded shoulders, forward head, and decreased lumbar lordosis  LUMBAR ROM:   AROM 06/14/2023  Flexion   Extension 10  Right lateral flexion   Left lateral flexion   Right rotation   Left rotation    (Blank rows = not tested)  LOWER EXTREMITY ROM:     Passive  Left/Right 06/14/2023   Hip flexion 85/85   Hip extension    Hip abduction    Hip adduction    Hip internal rotation 15/10   Hip external rotation 16/23   Knee flexion    Knee extension    Ankle dorsiflexion    Ankle plantarflexion    Ankle inversion    Ankle eversion     (Blank rows = not tested)  STRENGTH:    MMT (out of 5) or hand held dynamometer in pounds 06/14/2023   Hip flexion    Hip extension    Hip abduction    Hip adduction    Hip internal rotation    Hip external rotation    Knee flexion    Knee extension    Ankle dorsiflexion    Ankle plantarflexion 5-/5   Ankle inversion 68.5/68.1 pounds   Ankle eversion 67.5/67.9 pounds   Lumbar extension 60 seconds     (Blank rows = not tested)  TREATMENT DATE:  06/14/2023 Standing limited range lumbar extension 10 x 3 seconds Supine hamstrings stretch 3 x 20 seconds (push other leg into mat)    Standing hamstrings stretch 2 x 20 (nice inward lumbar curve and slight bend in knee)  Standing hip flexors stretch with foot in chair and slight turn towards the foot in the chair 3 x 20 seconds Supine figure 4 stretch 3 x 20 seconds (push away, tried knee to chest but too tight)  Reviewed exam findings, HEP and 2019 imaging with spine model  PATIENT EDUCATION:  Education details: See note Person educated: Patient Education method: Explanation, Demonstration, Tactile cues, Verbal cues, and Handouts Education  comprehension: verbalized understanding, returned demonstration, verbal cues required, tactile cues required, and needs further education  HOME EXERCISE PROGRAM: Access Code: [email or web address] URL: [email or web address] Date: 06/14/2023 Prepared by: Lamar Ivory  Exercises - Standing Lumbar Extension at Wall - Forearms  - 5 x daily - 7 x weekly - 1 sets - 5 reps - 3 seconds hold - Supine Hamstring Stretch  - 2-3 x daily - 7 x weekly - 1 sets - 5 reps - 20 seconds hold - Hip Flexor Stretch with Chair  - 2-3 x daily - 7 x weekly - 1 sets - 5 reps - 20 seconds hold - Supine Figure 4 Piriformis Stretch  - 2-3 x daily - 7 x weekly - 1 sets - 5 reps - 20 seconds hold  ASSESSMENT:  CLINICAL IMPRESSION: Patient is a 43 y.o. male who was seen today for physical therapy evaluation and treatment for M43.17 (ICD-10-CM) - Spondylolisthesis at L5-S1 level M54.42,G89.29 (ICD-10-CM) - Chronic left-sided low back pain with left-sided sciatica.  Shanta has a (at least) 6 year history of left lateral leg pain that has been noticeably increased over the past few weeks.  He notes no particular reason for the recent exacerbation.  Significant clinical findings included slight limitations in lumbar extension AROM, very tight hip flexors, hamstrings and hip external rotation active range of motion along with postural impairments as noted above.  2019 MRI (the most recent MRI available) also shows a 0.7 cm anterolisthesis L5 on S1, moderately severe left foraminal narrowing at L5-S1 and a small disc protrusion that may be compressing the L5 nerve root.  As far as weakness, there may be some slight S1 weakness on the left as assessed by single-leg heel raise, although this was not obvious.  Clinically, I believe Prentiss will benefit most from postural correction, appropriate lumbar AROM interventions and most importantly hip flexibility activities to reduce strain on his low back and hopefully relieve pressure on the  irritated nerve root.  OBJECTIVE IMPAIRMENTS: decreased ROM, impaired perceived functional ability, impaired flexibility, postural dysfunction, and pain.   ACTIVITY LIMITATIONS: sleeping  PARTICIPATION LIMITATIONS: community activity  PERSONAL FACTORS: HTN, Gout, Lt elbow and knee surgeries are also affecting patient's functional outcome.   REHAB POTENTIAL: Good  CLINICAL DECISION MAKING: Stable/uncomplicated  EVALUATION COMPLEXITY: Low   GOALS: Goals reviewed with patient? Yes  SHORT TERM GOALS: Target date: 07/12/2023  Jencarlos will be independent with his day 1 home exercise program Baseline: Started 06/14/2023 Goal status: INITIAL  2.  Improve lumbar extension AROM to 15 degrees Baseline: 10 degrees Goal status: INITIAL  3.  Improve bilateral lower extremity flexibility for hip flexors to 95 degrees, hamstrings to 45 degrees and hip external rotation to at least 25 degrees Baseline: 85/85; 35/35; 16/23 Goal status: INITIAL   LONG TERM GOALS: Target date: 08/09/2023  Improve FOTO to 68 in 11 visits Baseline: 60, risk adjusted 51 Goal status: INITIAL  2.  Improve low back and left lateral leg pain to 0-2/10 on the visual analog scale Baseline: 0-5/10 (occasional gabapentin  at night) Goal status: INITIAL  3.  Improve bilateral hip flexors flexibility to at least 100 degrees, hamstrings to 50 degrees and hip external rotation to 40 degrees Baseline: See short-term goals Goal status: INITIAL  4.  Gurvir will be independent with his long-term home maintenance exercise program at discharge Baseline: Started 06/14/2023 Goal  status: INITIAL   PLAN:  PT FREQUENCY: 1x/week  PT DURATION: 8 weeks  PLANNED INTERVENTIONS: 97110-Therapeutic exercises, 97530- Therapeutic activity, [id number]- Neuromuscular re-education, 97535- Self Care, [id number]- Manual therapy, 97012- Traction (mechanical), Patient/Family education, Dry Needling, Joint mobilization, Cryotherapy, and Moist heat.  PLAN FOR  NEXT SESSION: Review day 1 home exercise program with modifications as needed to make it easier for him to get things in at work.  Consider adding 1 prone strengthening exercise and possibly hip abduction with pelvic stabilization for the strength component.  Keep things brief as he is a very busy professional and a more streamlined home exercise program will be most beneficial.   Myer LELON Ivory, PT, MPT 06/14/2023, 4:55 PM

## 2023-06-26 ENCOUNTER — Encounter: Payer: Commercial Managed Care - PPO | Admitting: Rehabilitative and Restorative Service Providers"

## 2023-06-28 ENCOUNTER — Ambulatory Visit
Admission: RE | Admit: 2023-06-28 | Discharge: 2023-06-28 | Disposition: A | Discharge status: Home / Self Care | Payer: Commercial Managed Care - PPO | Source: Ambulatory Visit | Attending: Sports Medicine | Admitting: Sports Medicine

## 2023-06-28 DIAGNOSIS — M4317 Spondylolisthesis, lumbosacral region: Secondary | ICD-10-CM

## 2023-06-28 DIAGNOSIS — G8929 Other chronic pain: Secondary | ICD-10-CM

## 2023-06-28 DIAGNOSIS — M4316 Spondylolisthesis, lumbar region: Secondary | ICD-10-CM | POA: Diagnosis not present

## 2023-06-28 DIAGNOSIS — M4727 Other spondylosis with radiculopathy, lumbosacral region: Secondary | ICD-10-CM | POA: Diagnosis not present

## 2023-06-28 MED ORDER — METHYLPREDNISOLONE ACETATE 40 MG/ML INJ SUSP (RADIOLOG
80.0000 mg | Freq: Once | INTRAMUSCULAR | Status: AC
  Administered 2023-06-28: 80 mg via EPIDURAL

## 2023-06-28 MED ORDER — IOPAMIDOL (ISOVUE-M 200) INJECTION 41%
1.0000 mL | Freq: Once | INTRAMUSCULAR | Status: AC
  Administered 2023-06-28: 1 mL via EPIDURAL

## 2023-06-28 NOTE — Discharge Instructions (Signed)

## 2023-06-29 ENCOUNTER — Other Ambulatory Visit (HOSPITAL_COMMUNITY): Payer: Self-pay

## 2023-07-04 ENCOUNTER — Encounter: Payer: Self-pay | Admitting: Gastroenterology

## 2023-07-04 ENCOUNTER — Other Ambulatory Visit: Payer: Self-pay

## 2023-07-04 ENCOUNTER — Encounter (HOSPITAL_COMMUNITY): Payer: Self-pay

## 2023-07-04 ENCOUNTER — Other Ambulatory Visit (HOSPITAL_COMMUNITY): Payer: Self-pay

## 2023-07-04 MED ORDER — METHYLPHENIDATE HCL ER (CD) 30 MG PO CPCR
30.0000 mg | ORAL_CAPSULE | Freq: Every day | ORAL | 0 refills | Status: DC
  Filled 2023-07-04 – 2023-07-05 (×2): qty 30, 30d supply, fill #0

## 2023-07-05 ENCOUNTER — Ambulatory Visit: Payer: Commercial Managed Care - PPO | Admitting: Rehabilitative and Restorative Service Providers"

## 2023-07-05 ENCOUNTER — Other Ambulatory Visit: Payer: Self-pay

## 2023-07-05 ENCOUNTER — Other Ambulatory Visit (HOSPITAL_COMMUNITY): Payer: Self-pay

## 2023-07-05 ENCOUNTER — Encounter: Payer: Self-pay | Admitting: Rehabilitative and Restorative Service Providers"

## 2023-07-05 DIAGNOSIS — M5416 Radiculopathy, lumbar region: Secondary | ICD-10-CM

## 2023-07-05 DIAGNOSIS — M5459 Other low back pain: Secondary | ICD-10-CM

## 2023-07-05 DIAGNOSIS — R293 Abnormal posture: Secondary | ICD-10-CM

## 2023-07-05 NOTE — Therapy (Signed)
 OUTPATIENT PHYSICAL THERAPY THORACOLUMBAR TREATMENT   Patient Name: Andrew Gray MRN: [id number] DOB:1980/10/11, 43 y.o., male Today's Date: 07/05/2023  END OF SESSION:  PT End of Session - 07/05/23 1256     Visit Number 2    Number of Visits 11    Date for PT Re-Evaluation 08/09/23    Progress Note Due on Visit 11    PT Start Time 1256    PT Stop Time 1345    PT Time Calculation (min) 49 min    Activity Tolerance Patient tolerated treatment well;No increased pain    Behavior During Therapy WFL for tasks assessed/performed              Past Medical History:  Diagnosis Date   ADHD    Back pain    Fatty liver    GERD (gastroesophageal reflux disease)    Gout    Hypertension    Joint pain    Lactose intolerance    OSA (obstructive sleep apnea)    Seasonal allergies    Sleep apnea    Past Surgical History:  Procedure Laterality Date   ELBOW SURGERY Left    KNEE SURGERY     VASECTOMY     Patient Active Problem List   Diagnosis Date Noted   Anxiety 05/31/2023   Hemorrhoid prolapse 02/14/2023   Rash 11/12/2022   GERD (gastroesophageal reflux disease) 11/11/2022   Hepatic steatosis 04/08/2020   Obesity (BMI 30-39.9) 03/01/2020   OSA (obstructive sleep apnea) 12/30/2019   Right hip osteoarthritis and labral tearing 01/23/2018   Rosacea 10/11/2017   Adult ADHD 10/11/2017   Hyperlipidemia 02/23/2015   Essential hypertension, benign 02/23/2015   Gout 02/16/2015   Spondylolisthesis at L5-S1 level 02/15/2015    PCP: Pincus Sanes, MD  REFERRING PROVIDER: Richardean Sale, DO  REFERRING DIAG: [phone] (ICD-10-CM) - Spondylolisthesis at L5-S1 level M54.42,G89.29 (ICD-10-CM) - Chronic left-sided low back pain with left-sided sciatica  Rationale for Evaluation and Treatment: Rehabilitation  THERAPY DIAG:  Abnormal posture  Radiculopathy, lumbar region  Other low back pain  ONSET DATE: Chronic (at least 2019) with left sided symptoms worsening over the past  few weeks.  SUBJECTIVE:                                                                                                                                                                                           SUBJECTIVE STATEMENT: Herberth notes left sided peripheral symptoms were more intense since physical therapy evaluation.  We discussed continuing to address impairments noted at evaluation but in a more moderate approach as Alter and I discussed I started him on  a more aggressive protocol on day 1.  Korde notes left lateral calf pain of at least 6 years duration.  Symptoms started getting worse over the last few weeks with no specific reason for exacerbation noted.  He treadmill walks for exercise.  Pain can be bad at night.  Hamstrings tightness is noted.  1 week ago today had an epidural.    [facility]  PERTINENT HISTORY:  HTN, Gout, Lt elbow and knee surgeries  PAIN:  Are you having pain? Yes: NPRS scale: 0-6/10 since evaluation Pain location: Left lateral thigh to the ankle Pain description: Painful (toothache) Aggravating factors: Leg up, at night Relieving factors: Some relief with gabapentin and an epidural  PRECAUTIONS: Back  RED FLAGS: None   WEIGHT BEARING RESTRICTIONS: No  FALLS:  Has patient fallen in last 6 months? No  OCCUPATION: Sports Medicine MD  PLOF: Independent  PATIENT GOALS: Get rid of left lateral calf pain and improve low back stiffness and pain  NEXT MD VISIT: ?  OBJECTIVE:  Note: Objective measures were completed at Evaluation unless otherwise noted.  DIAGNOSTIC FINDINGS:  From 2019 MRI:  No change in bilateral L5 pars interarticularis defects resulting in 0.7 cm anterolisthesis L5 on S1. Moderately severe left foraminal narrowing at L5-S1 due to anterolisthesis and a small disc protrusion has worsened since the prior examination. The exiting left L5 root appears compressed in the foramen. Mild to moderate right foraminal narrowing appears  unchanged.   Annular fissure and very shallow central protrusion at L4-5 without stenosis, unchanged.  PATIENT SURVEYS:  FOTO 60 (risk-adjusted 51, Goal 68 in 11 visits)  COGNITION: Overall cognitive status: Within functional limits for tasks assessed     SENSATION: Significant for left lateral calf "pain"  MUSCLE LENGTH: 07/05/2023 Hamstrings: Right 45 deg; Left 40 deg  Eval Hamstrings: Right 35 deg; Left 35 deg  POSTURE: rounded shoulders, forward head, and decreased lumbar lordosis  LUMBAR ROM:   AROM 06/14/2023 07/05/2023  Flexion    Extension 10   Right lateral flexion    Left lateral flexion    Right rotation    Left rotation     (Blank rows = not tested)  LOWER EXTREMITY ROM:     Passive  Left/Right 06/14/2023 Left/Right 07/05/2023  Hip flexion 85/85 85/95  Hip extension    Hip abduction    Hip adduction    Hip internal rotation 15/10 15/11  Hip external rotation 16/23 26/30  Knee flexion    Knee extension    Ankle dorsiflexion    Ankle plantarflexion    Ankle inversion    Ankle eversion     (Blank rows = not tested)  STRENGTH:    MMT (out of 5) or hand held dynamometer in pounds 06/14/2023   Hip flexion    Hip extension    Hip abduction    Hip adduction    Hip internal rotation    Hip external rotation    Knee flexion    Knee extension    Ankle dorsiflexion    Ankle plantarflexion 5-/5   Ankle inversion 68.5/68.1 pounds   Ankle eversion 67.5/67.9 pounds   Lumbar extension 60 seconds     (Blank rows = not tested)  TREATMENT DATE:  07/05/2023 Standing limited range lumbar extension 10 x 3 seconds Supine hamstrings stretch 5 x 20 seconds (push other leg into mat)    Supine single knee to chest stretch 3 x 20 seconds (other leg pushed into table) Modified Thomas stretch 1  time 20 seconds Thomas stretch 1 time 20 seconds Hip hike with shoulder in doorframe 2 sets of 5 for 3 seconds Prone alternating hip extension 10 x 3 seconds  Reviewed exam  findings, HEP changes, log roll for bed mobility, reviewed the importance of avoiding prolonged seated, flexed and slouched postures and again reviewed the 2019 imaging with the spine model.   06/14/2023 Standing limited range lumbar extension 10 x 3 seconds Supine hamstrings stretch 3 x 20 seconds (push other leg into mat)    Standing hamstrings stretch 2 x 20 (nice inward lumbar curve and slight bend in knee)  Standing hip flexors stretch with foot in chair and slight turn towards the foot in the chair 3 x 20 seconds Supine figure 4 stretch 3 x 20 seconds (push away, tried knee to chest but too tight)  Reviewed exam findings, HEP and 2019 imaging with spine model                                                                                                             PATIENT EDUCATION:  Education details: See note Person educated: Patient Education method: Explanation, Demonstration, Tactile cues, Verbal cues, and Handouts Education comprehension: verbalized understanding, returned demonstration, verbal cues required, tactile cues required, and needs further education  HOME EXERCISE PROGRAM: Access Code: [email or web address] URL: [email or web address] Date: 07/05/2023 Prepared by: Pauletta Browns  Exercises - Standing Lumbar Extension at Wall - Forearms  - 5 x daily - 7 x weekly - 1 sets - 5 reps - 3 seconds hold - Supine Hamstring Stretch  - 2-3 x daily - 7 x weekly - 1 sets - 5 reps - 20 seconds hold - Hip Flexor Stretch with Chair  - 2-3 x daily - 1 x weekly - 1 sets - 5 reps - 20 seconds hold - Supine Figure 4 Piriformis Stretch  - 2-3 x daily - 7 x weekly - 1 sets - 5 reps - 20 seconds hold - Modified Thomas Stretch  - 2-3 x daily - 7 x weekly - 1 sets - 5 reps - 20 seconds hold - Standing Hip Hiking  - 3 x daily - 7 x weekly - 1 sets - 10 reps - 3 seconds hold - Prone Hip Extension  - 1-2 x daily - 7 x weekly - 1-2 sets - 10 reps - 3-10 seconds hold  ASSESSMENT:  CLINICAL  IMPRESSION: Truett had a hard time with his exercises after visit 1.  After speaking with him, it appears to be a combination of being a bit more aggressive than he is ready combined with some faulty body mechanics with yoga and daily activities.  We discussed the importance of avoiding prolonged sitting, flexed postures and did some education with bed mobility and some other things like proper lumbar roll use to help reduce any possible lumbar HNP.  Areas addressed at evaluation are appropriate, although we dialed back the intensity today to hopefully allow him to improve without exacerbating symptoms. Mylik was more comfortable today post epidural and with  decreasing the intensity of some of the stretches.  We also progressed some spine stabilization activities for long-term management.  Patient is a 43 y.o. male who was seen today for physical therapy evaluation and treatment for M43.17 (ICD-10-CM) - Spondylolisthesis at L5-S1 level M54.42,G89.29 (ICD-10-CM) - Chronic left-sided low back pain with left-sided sciatica.  Lavell has a (at least) 6 year history of left lateral leg pain that has been noticeably increased over the past few weeks.  He notes no particular reason for the recent exacerbation.  Significant clinical findings included slight limitations in lumbar extension AROM, very tight hip flexors, hamstrings and hip external rotation active range of motion along with postural impairments as noted above.  2019 MRI (the most recent MRI available) also shows a 0.7 cm anterolisthesis L5 on S1, moderately severe left foraminal narrowing at L5-S1 and a small disc protrusion that may be compressing the L5 nerve root.  As far as weakness, there may be some slight S1 weakness on the left as assessed by single-leg heel raise, although this was not obvious.  Clinically, I believe Draedyn will benefit most from postural correction, appropriate lumbar AROM interventions and most importantly hip flexibility activities to  reduce strain on his low back and hopefully relieve pressure on the irritated nerve root.  OBJECTIVE IMPAIRMENTS: decreased ROM, impaired perceived functional ability, impaired flexibility, postural dysfunction, and pain.   ACTIVITY LIMITATIONS: sleeping  PARTICIPATION LIMITATIONS: community activity  PERSONAL FACTORS: HTN, Gout, Lt elbow and knee surgeries are also affecting patient's functional outcome.   REHAB POTENTIAL: Good  CLINICAL DECISION MAKING: Stable/uncomplicated  EVALUATION COMPLEXITY: Low   GOALS: Goals reviewed with patient? Yes  SHORT TERM GOALS: Target date: 07/12/2023  Julien will be independent with his day 1 home exercise program Baseline: Started 06/14/2023 Goal status: On Going 07/05/2023  2.  Improve lumbar extension AROM to 15 degrees Baseline: 10 degrees Goal status: INITIAL  3.  Improve bilateral lower extremity flexibility for hip flexors to 95 degrees, hamstrings to 45 degrees and hip external rotation to at least 25 degrees Baseline: 85/85; 35/35; 16/23 Goal status: Partially Met 07/05/2023   LONG TERM GOALS: Target date: 08/09/2023  Improve FOTO to 68 in 11 visits Baseline: 60, risk adjusted 51 Goal status: INITIAL  2.  Improve low back and left lateral leg pain to 0-2/10 on the visual analog scale Baseline: 0-5/10 (occasional gabapentin at night) Goal status: On Going 07/05/2023  3.  Improve bilateral hip flexors flexibility to at least 100 degrees, hamstrings to 50 degrees and hip external rotation to 40 degrees Baseline: See short-term goals Goal status: INITIAL  4.  Angeldejesus will be independent with his long-term home maintenance exercise program at discharge Baseline: Started 06/14/2023 Goal status: INITIAL   PLAN:  PT FREQUENCY: 1x/week  PT DURATION: 8 weeks  PLANNED INTERVENTIONS: 97110-Therapeutic exercises, 97530- Therapeutic activity, 97112- Neuromuscular re-education, 97535- Self Care, 16109- Manual therapy, 97012- Traction  (mechanical), Patient/Family education, Dry Needling, Joint mobilization, Cryotherapy, and Moist heat.  PLAN FOR NEXT SESSION: Review updates and changes to home exercise program.  Make modifications as needed to make it easier for him to get things in at work.  Consider progressing prone strengthening and hip abduction with pelvic stabilization as appropriate.  Keep things brief as he is a very busy professional and a more streamlined home exercise program will be most beneficial.   Cherlyn Cushing, PT, MPT 07/05/2023, 5:04 PM

## 2023-07-08 ENCOUNTER — Encounter: Payer: Self-pay | Admitting: Gastroenterology

## 2023-07-09 ENCOUNTER — Other Ambulatory Visit (HOSPITAL_COMMUNITY): Payer: Self-pay

## 2023-07-12 ENCOUNTER — Ambulatory Visit: Payer: Commercial Managed Care - PPO | Admitting: Rehabilitative and Restorative Service Providers"

## 2023-07-12 ENCOUNTER — Encounter: Payer: Self-pay | Admitting: Rehabilitative and Restorative Service Providers"

## 2023-07-12 DIAGNOSIS — R293 Abnormal posture: Secondary | ICD-10-CM | POA: Diagnosis not present

## 2023-07-12 DIAGNOSIS — M5459 Other low back pain: Secondary | ICD-10-CM

## 2023-07-12 DIAGNOSIS — M5416 Radiculopathy, lumbar region: Secondary | ICD-10-CM | POA: Diagnosis not present

## 2023-07-12 NOTE — Therapy (Signed)
 OUTPATIENT PHYSICAL THERAPY THORACOLUMBAR TREATMENT   Patient Name: Andrew Gray MRN: [id number] DOB:[birth year], 43 y.o., male Today's Date: 07/12/2023  END OF SESSION:  PT End of Session - 07/12/23 1258     Visit Number 3    Number of Visits 11    Date for PT Re-Evaluation 08/09/23    Progress Note Due on Visit 11    PT Start Time 1258    PT Stop Time 1339    PT Time Calculation (min) 41 min    Activity Tolerance Patient tolerated treatment well;No increased pain    Behavior During Therapy WFL for tasks assessed/performed             Past Medical History:  Diagnosis Date   ADHD    Back pain    Fatty liver    GERD (gastroesophageal reflux disease)    Gout    Hypertension    Joint pain    Lactose intolerance    OSA (obstructive sleep apnea)    Seasonal allergies    Sleep apnea    Past Surgical History:  Procedure Laterality Date   ELBOW SURGERY Left    KNEE SURGERY     VASECTOMY     Patient Active Problem List   Diagnosis Date Noted   Anxiety 05/31/2023   Hemorrhoid prolapse 02/14/2023   Rash 11/12/2022   GERD (gastroesophageal reflux disease) 11/11/2022   Hepatic steatosis 04/08/2020   Obesity (BMI 30-39.9) 03/01/2020   OSA (obstructive sleep apnea) 12/30/2019   Right hip osteoarthritis and labral tearing 01/23/2018   Rosacea 10/11/2017   Adult ADHD 10/11/2017   Hyperlipidemia 02/23/2015   Essential hypertension, benign 02/23/2015   Gout 02/16/2015   Spondylolisthesis at L5-S1 level 02/15/2015    PCP: Pincus Sanes, MD  REFERRING PROVIDER: Richardean Sale, DO  REFERRING DIAG: [phone] (ICD-10-CM) - Spondylolisthesis at L5-S1 level M54.42,G89.29 (ICD-10-CM) - Chronic left-sided low back pain with left-sided sciatica  Rationale for Evaluation and Treatment: Rehabilitation  THERAPY DIAG:  Abnormal posture  Radiculopathy, lumbar region  Other low back pain  ONSET DATE: Chronic (at least 2019) with left sided symptoms worsening over the past few  weeks.  SUBJECTIVE:                                                                                                                                                                                           SUBJECTIVE STATEMENT: Andrew Gray notes "fair" HEP compliance and mildly improved symptoms over the past week.  Symptoms are still as distal as the mid-lateral calf and can be as high as 3/10 on the VAS.  Left sided trochanteric pain can  be as high as 5/10 and mostly is noted at night.  Chronic back pain.  His symptoms have a gap between the left trochanteric bursa and knee, then reappear from the knee to mid-lateral calf.  Andrew Gray notes left lateral calf pain of at least 6 years duration.  Symptoms started getting worse over the last few weeks with no specific reason for exacerbation noted.  He treadmill walks for exercise.  Pain can be bad at night.  Hamstrings tightness is noted.  1 week ago today had an epidural.    [facility]  PERTINENT HISTORY:  HTN, Gout, Lt elbow and knee surgeries  PAIN:  Are you having pain? Yes: NPRS scale: 0-5/10 since evaluation Pain location: Left lateral thigh and left knee to the ankle Pain description: Painful (toothache) Aggravating factors: Leg up, at night Relieving factors: Some relief with gabapentin and an epidural  PRECAUTIONS: Back  RED FLAGS: None   WEIGHT BEARING RESTRICTIONS: No  FALLS:  Has patient fallen in last 6 months? No  OCCUPATION: Sports Medicine MD  PLOF: Independent  PATIENT GOALS: Get rid of left lateral calf pain and improve low back stiffness and pain  NEXT MD VISIT: ?  OBJECTIVE:  Note: Objective measures were completed at Evaluation unless otherwise noted.  DIAGNOSTIC FINDINGS:  From 2019 MRI:  No change in bilateral L5 pars interarticularis defects resulting in 0.7 cm anterolisthesis L5 on S1. Moderately severe left foraminal narrowing at L5-S1 due to anterolisthesis and a small disc protrusion has worsened since the  prior examination. The exiting left L5 root appears compressed in the foramen. Mild to moderate right foraminal narrowing appears unchanged.   Annular fissure and very shallow central protrusion at L4-5 without stenosis, unchanged.  PATIENT SURVEYS:  FOTO 60 (risk-adjusted 51, Goal 68 in 11 visits)  COGNITION: Overall cognitive status: Within functional limits for tasks assessed     SENSATION: Significant for left lateral calf "pain"  MUSCLE LENGTH: 07/05/2023 Hamstrings: Right 45 deg; Left 40 deg  Eval Hamstrings: Right 35 deg; Left 35 deg  POSTURE: rounded shoulders, forward head, and decreased lumbar lordosis  LUMBAR ROM:   AROM 06/14/2023 07/05/2023  Flexion    Extension 10   Right lateral flexion    Left lateral flexion    Right rotation    Left rotation     (Blank rows = not tested)  LOWER EXTREMITY ROM:     Passive  Left/Right 06/14/2023 Left/Right 07/05/2023  Hip flexion 85/85 85/95  Hip extension    Hip abduction    Hip adduction    Hip internal rotation 15/10 15/11  Hip external rotation 16/23 26/30  Knee flexion    Knee extension    Ankle dorsiflexion    Ankle plantarflexion    Ankle inversion    Ankle eversion     (Blank rows = not tested)  STRENGTH:    MMT (out of 5) or hand held dynamometer in pounds 06/14/2023   Hip flexion    Hip extension    Hip abduction    Hip adduction    Hip internal rotation    Hip external rotation    Knee flexion    Knee extension    Ankle dorsiflexion    Ankle plantarflexion 5-/5   Ankle inversion 68.5/68.1 pounds   Ankle eversion 67.5/67.9 pounds   Lumbar extension 60 seconds     (Blank rows = not tested)  TREATMENT DATE:  07/12/2023 Standing limited range lumbar extension 10 x 3 seconds Contract-relax supine  hamstrings stretch 4 x 20 seconds (push other leg into mat)    Figure 4 stretch 4 x 20 seconds Modified Thomas stretch 4 times 20 seconds Hip hike with shoulder in doorframe 5 for 5 seconds Hip abduction  with pelvic stabilization 2 sets of 5 for 5 seconds (hike, toe in and push heel into door frame) Prone alternating hip extension 10 x 5 seconds Prone alternating arm and leg extensions 10 x 5 seconds  Reviewed HEP changes, log roll for bed mobility, reviewed the importance of avoiding prolonged seated, flexed and slouched postures    07/05/2023 Standing limited range lumbar extension 10 x 3 seconds Supine hamstrings stretch 5 x 20 seconds (push other leg into mat)    Supine single knee to chest stretch 3 x 20 seconds (other leg pushed into table) Modified Thomas stretch 1 time 20 seconds Thomas stretch 1 time 20 seconds Hip hike with shoulder in doorframe 2 sets of 5 for 3 seconds Prone alternating hip extension 10 x 3 seconds  Reviewed exam findings, HEP changes, log roll for bed mobility, reviewed the importance of avoiding prolonged seated, flexed and slouched postures and again reviewed the 2019 imaging with the spine model.   06/14/2023 Standing limited range lumbar extension 10 x 3 seconds Supine hamstrings stretch 3 x 20 seconds (push other leg into mat)    Standing hamstrings stretch 2 x 20 (nice inward lumbar curve and slight bend in knee)  Standing hip flexors stretch with foot in chair and slight turn towards the foot in the chair 3 x 20 seconds Supine figure 4 stretch 3 x 20 seconds (push away, tried knee to chest but too tight)  Reviewed exam findings, HEP and 2019 imaging with spine model                                                                                                             PATIENT EDUCATION:  Education details: See note Person educated: Patient Education method: Explanation, Demonstration, Tactile cues, Verbal cues, and Handouts Education comprehension: verbalized understanding, returned demonstration, verbal cues required, tactile cues required, and needs further education  HOME EXERCISE PROGRAM: Access Code: [email or web address] URL:  [email or web address] Date: 07/12/2023 Prepared by: Pauletta Browns  Exercises - Standing Lumbar Extension at Wall - Forearms  - 5 x daily - 7 x weekly - 1 sets - 5 reps - 3 seconds hold - Supine Hamstring Stretch  - 2 x daily - 7 x weekly - 1 sets - 4-5 reps - 10-15 seconds hold - Hip Flexor Stretch with Chair  - 2-3 x daily - 1 x weekly - 1 sets - 5 reps - 20 seconds hold - Supine Figure 4 Piriformis Stretch  - 2 x daily - 7 x weekly - 1 sets - 4-5 reps - 10-15 seconds hold - Modified Thomas Stretch  - 2 x daily - 7 x weekly - 1 sets - 4-5 reps - 10-15 seconds hold - Standing Hip Hiking  - 3 x daily - 7 x weekly - 1 sets -  10 reps - 3 seconds hold - Prone Hip Extension  - 1-2 x daily - 7 x weekly - 1 sets - 10 reps - 5-10 seconds hold - Prone Alternating Arm and Leg Lifts  - 1-2 x daily - 7 x weekly - 1 sets - 10 reps - 5-10 seconds hold  ASSESSMENT:  CLINICAL IMPRESSION: Hadriel had an easier time with his exercises after visit 2.  Kylon admits home exercise program compliance has been consistent, although it could be more frequent.  Some faulty body mechanics with yoga and daily activities are persisting and we addressed this again today.  We reviewed the importance of avoiding prolonged sitting, flexed postures and did some education with bed mobility and some other things like proper lumbar roll use to help reduce any possible lumbar HNP.  We progressed strengthening of the 3 core stabilizers of lumbar paraspinals, quadratus lumborum and hip abductors.  Additional visits will focus on appropriate strength progressions, postural and body mechanics education and other techniques for long-term management.   Patient is a 43 y.o. male who was seen today for physical therapy evaluation and treatment for M43.17 (ICD-10-CM) - Spondylolisthesis at L5-S1 level M54.42,G89.29 (ICD-10-CM) - Chronic left-sided low back pain with left-sided sciatica.  Dayle has a (at least) 6 year history of left  lateral leg pain that has been noticeably increased over the past few weeks.  He notes no particular reason for the recent exacerbation.  Significant clinical findings included slight limitations in lumbar extension AROM, very tight hip flexors, hamstrings and hip external rotation active range of motion along with postural impairments as noted above.  2019 MRI (the most recent MRI available) also shows a 0.7 cm anterolisthesis L5 on S1, moderately severe left foraminal narrowing at L5-S1 and a small disc protrusion that may be compressing the L5 nerve root.  As far as weakness, there may be some slight S1 weakness on the left as assessed by single-leg heel raise, although this was not obvious.  Clinically, I believe Jamee will benefit most from postural correction, appropriate lumbar AROM interventions and most importantly hip flexibility activities to reduce strain on his low back and hopefully relieve pressure on the irritated nerve root.  OBJECTIVE IMPAIRMENTS: decreased ROM, impaired perceived functional ability, impaired flexibility, postural dysfunction, and pain.   ACTIVITY LIMITATIONS: sleeping  PARTICIPATION LIMITATIONS: community activity  PERSONAL FACTORS: HTN, Gout, Lt elbow and knee surgeries are also affecting patient's functional outcome.   REHAB POTENTIAL: Good  CLINICAL DECISION MAKING: Stable/uncomplicated  EVALUATION COMPLEXITY: Low   GOALS: Goals reviewed with patient? Yes  SHORT TERM GOALS: Target date: 07/12/2023  Demetres will be independent with his day 1 home exercise program Baseline: Started 06/14/2023 Goal status: Met 07/12/2023  2.  Improve lumbar extension AROM to 15 degrees Baseline: 10 degrees Goal status: INITIAL  3.  Improve bilateral lower extremity flexibility for hip flexors to 95 degrees, hamstrings to 45 degrees and hip external rotation to at least 25 degrees Baseline: 85/85; 35/35; 16/23 Goal status: Partially Met 07/05/2023   LONG TERM GOALS: Target  date: 08/09/2023  Improve FOTO to 68 in 11 visits Baseline: 60, risk adjusted 51 Goal status: INITIAL  2.  Improve low back and left lateral leg pain to 0-2/10 on the visual analog scale Baseline: 0-5/10 (occasional gabapentin at night) Goal status: On Going 07/12/2023  3.  Improve bilateral hip flexors flexibility to at least 100 degrees, hamstrings to 50 degrees and hip external rotation to 40 degrees Baseline: See  short-term goals Goal status: INITIAL  4.  Sedric will be independent with his long-term home maintenance exercise program at discharge Baseline: Started 06/14/2023 Goal status: INITIAL   PLAN:  PT FREQUENCY: 1x/week  PT DURATION: 8 weeks  PLANNED INTERVENTIONS: 97110-Therapeutic exercises, 97530- Therapeutic activity, 97112- Neuromuscular re-education, 97535- Self Care, 69629- Manual therapy, 97012- Traction (mechanical), Patient/Family education, Dry Needling, Joint mobilization, Cryotherapy, and Moist heat.  PLAN FOR NEXT SESSION: Review updates and changes to home exercise program.  Make modifications as needed to make it easier for him to get things in at work.  Progress prone strengthening and hip abduction with pelvic stabilization as appropriate.  Continue to keep things brief as he is a very busy professional and a more streamlined home exercise program will be most beneficial.   Cherlyn Cushing, PT, MPT 07/12/2023, 2:54 PM

## 2023-07-15 ENCOUNTER — Encounter: Payer: Self-pay | Admitting: Gastroenterology

## 2023-07-15 ENCOUNTER — Ambulatory Visit (AMBULATORY_SURGERY_CENTER): Payer: Commercial Managed Care - PPO | Admitting: Gastroenterology

## 2023-07-15 VITALS — BP 106/66 | HR 51 | Temp 98.5°F | Resp 12 | Ht 72.0 in | Wt 271.0 lb

## 2023-07-15 DIAGNOSIS — K641 Second degree hemorrhoids: Secondary | ICD-10-CM | POA: Diagnosis not present

## 2023-07-15 DIAGNOSIS — D122 Benign neoplasm of ascending colon: Secondary | ICD-10-CM | POA: Diagnosis not present

## 2023-07-15 DIAGNOSIS — D123 Benign neoplasm of transverse colon: Secondary | ICD-10-CM

## 2023-07-15 DIAGNOSIS — K648 Other hemorrhoids: Secondary | ICD-10-CM

## 2023-07-15 DIAGNOSIS — K921 Melena: Secondary | ICD-10-CM

## 2023-07-15 DIAGNOSIS — K649 Unspecified hemorrhoids: Secondary | ICD-10-CM

## 2023-07-15 DIAGNOSIS — G4733 Obstructive sleep apnea (adult) (pediatric): Secondary | ICD-10-CM | POA: Diagnosis not present

## 2023-07-15 DIAGNOSIS — I10 Essential (primary) hypertension: Secondary | ICD-10-CM | POA: Diagnosis not present

## 2023-07-15 MED ORDER — SODIUM CHLORIDE 0.9 % IV SOLN: 500.0000 mL | Freq: Once | INTRAVENOUS | Status: DC

## 2023-07-15 NOTE — Progress Notes (Unsigned)
 Pt's states no medical or surgical changes since previsit or office visit.

## 2023-07-15 NOTE — Op Note (Signed)
 [facility] Patient Name: Andrew Gray Procedure Date: 07/15/2023 1:21 PM MRN: [id number] Endoscopist: Doristine Locks , MD, [id number] Age: 43 Referring MD:  Date of Birth: 1980/10/15 Gender: Male Account #: [id number] Procedure:                Colonoscopy Indications:              Hematochezia Medicines:                Monitored Anesthesia Care Procedure:                Pre-Anesthesia Assessment:                           - Prior to the procedure, a History and Physical                            was performed, and patient medications and                            allergies were reviewed. The patient's tolerance of                            previous anesthesia was also reviewed. The risks                            and benefits of the procedure and the sedation                            options and risks were discussed with the patient.                            All questions were answered, and informed consent                            was obtained. Prior Anticoagulants: The patient has                            taken no anticoagulant or antiplatelet agents. ASA                            Grade Assessment: II - A patient with mild systemic                            disease. After reviewing the risks and benefits,                            the patient was deemed in satisfactory condition to                            undergo the procedure.                           After obtaining informed consent, the colonoscope  was passed under direct vision. Throughout the                            procedure, the patient's blood pressure, pulse, and                            oxygen saturations were monitored continuously. The                            Olympus Scope SN: [id number] was introduced through                            the anus and advanced to the the terminal ileum.                            The colonoscopy was performed without difficulty.                             The patient tolerated the procedure well. The                            quality of the bowel preparation was good. The                            terminal ileum, ileocecal valve, appendiceal                            orifice, and rectum were photographed. Scope In: 1:29:32 PM Scope Out: 1:48:34 PM Scope Withdrawal Time: 0 hours 16 minutes 19 seconds  Total Procedure Duration: 0 hours 19 minutes 2 seconds  Findings:                 The perianal and digital rectal examinations were                            normal.                           Three sessile polyps were found in the proximal                            transverse colon (1) and ascending colon (2). The                            polyps were 3 to 5 mm in size. These polyps were                            removed with a cold snare. Resection and retrieval                            were complete. Estimated blood loss was minimal.                           The exam was otherwise normal throughout the  remainder of the colon. No areas of mucosal                            erythema, edema, erosions, or ulceration.                           Non-bleeding internal hemorrhoids were found during                            retroflexion. The hemorrhoids were small.                           The terminal ileum appeared normal. Complications:            No immediate complications. Estimated Blood Loss:     Estimated blood loss was minimal. Impression:               - Three 3 to 5 mm polyps in the proximal transverse                            colon and in the ascending colon, removed with a                            cold snare. Resected and retrieved.                           - Non-bleeding internal hemorrhoids.                           - The examined portion of the ileum was normal. Recommendation:           - Patient has a contact number available for                             emergencies. The signs and symptoms of potential                            delayed complications were discussed with the                            patient. Return to normal activities tomorrow.                            Written discharge instructions were provided to the                            patient.                           - Resume previous diet.                           - Continue present medications.                           - Await pathology results.                           -  Repeat colonoscopy for surveillance based on                            pathology results.                           - Return to GI office PRN.                           - Internal hemorrhoids were noted on this study and                            may be amenable to hemorrhoid band ligation. If you                            are interested in further treatment of these                            hemorrhoids with band ligation, please contact my                            clinic to set up an appointment for evaluation and                            treatment. Doristine Locks, MD 07/15/2023 1:54:27 PM

## 2023-07-15 NOTE — Progress Notes (Signed)
 Called to room to assist during endoscopic procedure.  Patient ID and intended procedure confirmed with present staff. Received instructions for my participation in the procedure from the performing physician.

## 2023-07-15 NOTE — Progress Notes (Unsigned)
 GASTROENTEROLOGY PROCEDURE H&P NOTE   Primary Care Physician: Pincus Sanes, MD    Reason for Procedure:  Hematochezia, symptomatic hemorrhoids  Plan:    Colonoscopy  Patient is appropriate for endoscopic procedure(s) in the ambulatory ([facility]) setting.  The nature of the procedure, as well as the risks, benefits, and alternatives were carefully and thoroughly reviewed with the patient. Ample time for discussion and questions allowed. The patient understood, was satisfied, and agreed to proceed.     HPI: Andrew Gray is a 43 y.o. male who presents for colonoscopy for evaluation of intermittent BRBPR.  Past Medical History:  Diagnosis Date   ADHD    Back pain    Fatty liver    GERD (gastroesophageal reflux disease)    Gout    Hypertension    Joint pain    Lactose intolerance    OSA (obstructive sleep apnea)    Seasonal allergies    Sleep apnea     Past Surgical History:  Procedure Laterality Date   ELBOW SURGERY Left    KNEE SURGERY     VASECTOMY      Prior to Admission medications   Medication Sig Start Date End Date Taking? Authorizing Provider  amLODipine (NORVASC) 10 MG tablet Take 1 tablet (10 mg total) by mouth daily. 01/14/23 01/14/24 Yes Burns, Bobette Mo, MD  cetirizine (ZYRTEC) 10 MG tablet Take 10 mg by mouth daily.   Yes [provider]  Febuxostat 80 MG TABS Take 1 tablet (80 mg total) by mouth daily. 11/05/22 11/05/23 Yes Burns, Bobette Mo, MD  FLUoxetine (PROZAC) 20 MG capsule Take 1 capsule (20 mg total) by mouth daily. 05/31/23  Yes Burns, Bobette Mo, MD  gabapentin (NEURONTIN) 300 MG capsule Take 1 capsule (300 mg total) by mouth at bedtime. 08/07/20  Yes Judi Saa, DO  hydrochlorothiazide (HYDRODIURIL) 25 MG tablet Take 1 tablet (25 mg total) by mouth daily. 01/14/23 01/14/24 Yes Burns, Bobette Mo, MD  methylphenidate (METADATE CD) 30 MG CR capsule Take 1 capsule (30 mg total) by mouth daily. 07/04/23  Yes   pantoprazole (PROTONIX) 40 MG tablet Take 1  tablet by mouth daily. 11/26/22 11/26/23 Yes Burns, Bobette Mo, MD  rosuvastatin (CRESTOR) 5 MG tablet Take 1 tablet (5 mg total) by mouth daily. 02/10/23  Yes   tadalafil (CIALIS) 20 MG tablet Take 1 tablet (20 mg total) by mouth daily as needed for erectile dysfunction (please use troches). 07/03/20  Yes Burns, Bobette Mo, MD  Vitamin D, Ergocalciferol, (DRISDOL) 1.25 MG (50000 UNIT) CAPS capsule Take 1 capsule (50,000 Units total) by mouth once a week. 12/31/22  Yes   betamethasone valerate (VALISONE) 0.1 % cream Apply 1 Application topically daily. 12/28/22     desonide (DESOWEN) 0.05 % ointment Apply 1 Application topically to eyelids  (two) times daily for 7 days. 12/28/22     traZODone (DESYREL) 50 MG tablet Take 1 to 2 tablets by mouth at bedtime. 01/15/22     triamcinolone cream (KENALOG) 0.1 % Apply 1 Application topically 2 (two) times daily. 11/12/22   Pincus Sanes, MD    Current Outpatient Medications  Medication Sig Dispense Refill   amLODipine (NORVASC) 10 MG tablet Take 1 tablet (10 mg total) by mouth daily. 90 tablet 3   cetirizine (ZYRTEC) 10 MG tablet Take 10 mg by mouth daily.     Febuxostat 80 MG TABS Take 1 tablet (80 mg total) by mouth daily. 90 tablet 3   FLUoxetine (PROZAC)  20 MG capsule Take 1 capsule (20 mg total) by mouth daily. 30 capsule 5   gabapentin (NEURONTIN) 300 MG capsule Take 1 capsule (300 mg total) by mouth at bedtime. 90 capsule 3   hydrochlorothiazide (HYDRODIURIL) 25 MG tablet Take 1 tablet (25 mg total) by mouth daily. 90 tablet 3   methylphenidate (METADATE CD) 30 MG CR capsule Take 1 capsule (30 mg total) by mouth daily. 30 capsule 0   pantoprazole (PROTONIX) 40 MG tablet Take 1 tablet by mouth daily. 90 tablet 3   rosuvastatin (CRESTOR) 5 MG tablet Take 1 tablet (5 mg total) by mouth daily. 90 tablet 3   tadalafil (CIALIS) 20 MG tablet Take 1 tablet (20 mg total) by mouth daily as needed for erectile dysfunction (please use troches). 30 tablet 11   Vitamin D,  Ergocalciferol, (DRISDOL) 1.25 MG (50000 UNIT) CAPS capsule Take 1 capsule (50,000 Units total) by mouth once a week. 12 capsule 0   betamethasone valerate (VALISONE) 0.1 % cream Apply 1 Application topically daily. 45 g 0   desonide (DESOWEN) 0.05 % ointment Apply 1 Application topically to eyelids  (two) times daily for 7 days. 30 g 0   traZODone (DESYREL) 50 MG tablet Take 1 to 2 tablets by mouth at bedtime. 60 tablet 3   triamcinolone cream (KENALOG) 0.1 % Apply 1 Application topically 2 (two) times daily. 30 g 0   Current Facility-Administered Medications  Medication Dose Route Frequency Provider Last Rate Last Admin   0.9 %  sodium chloride infusion  500 mL Intravenous Once Helene Bernstein V, DO        Allergies as of 07/15/2023 - Review Complete 07/15/2023  Allergen Reaction Noted   Sulfa antibiotics Nausea And Vomiting 02/23/2015    Family History  Problem Relation Age of Onset   Anxiety disorder Mother    Colon cancer Neg Hx    Liver disease Neg Hx    Esophageal cancer Neg Hx     Social History   Socioeconomic History   Marital status: Married    Spouse name: Not on file   Number of children: 0   Years of education: Not on file   Highest education level: Doctorate  Occupational History   Occupation: Doctor  Tobacco Use   Smoking status: Never    Passive exposure: Never   Smokeless tobacco: Never  Vaping Use   Vaping status: Never Used  Substance and Sexual Activity   Alcohol use: Yes    Alcohol/week: 0.0 standard drinks of alcohol   Drug use: No   Sexual activity: Yes    Partners: Female  Other Topics Concern   Not on file  Social History Narrative   Not on file   Social Drivers of Health   Financial Resource Strain: Low Risk  (05/29/2023)   Overall Financial Resource Strain (CARDIA)    Difficulty of Paying Living Expenses: Not hard at all  Food Insecurity: No Food Insecurity (05/29/2023)   Hunger Vital Sign    Worried About Running Out of Food in the  Last Year: Never true    Ran Out of Food in the Last Year: Never true  Transportation Needs: No Transportation Needs (05/29/2023)   PRAPARE - Administrator, Civil Service (Medical): No    Lack of Transportation (Non-Medical): No  Physical Activity: Insufficiently Active (05/29/2023)   Exercise Vital Sign    Days of Exercise per Week: 3 days    Minutes of Exercise per Session: 20 min  Stress: Stress Concern Present (05/29/2023)   [employer] of Occupational Health - Occupational Stress Questionnaire    Feeling of Stress : Rather much  Social Connections: Socially Isolated (05/29/2023)   Social Connection and Isolation Panel [NHANES]    Frequency of Communication with Friends and Family: Never    Frequency of Social Gatherings with Friends and Family: Never    Attends Religious Services: Never    Database administrator or Organizations: No    Attends Engineer, structural: Not on file    Marital Status: Married  Catering manager Violence: Not on file    Physical Exam: Vital signs in last 24 hours: @BP  119/61   Pulse 64   Temp 98.5 F (36.9 C) (Skin)   Ht 6' (1.829 m)   Wt 271 lb (122.9 kg)   SpO2 96%   BMI 36.75 kg/m  GEN: NAD EYE: Sclerae anicteric ENT: MMM CV: Non-tachycardic Pulm: CTA b/l GI: Soft, NT/ND NEURO:  Alert & Oriented x 3   Doristine Locks, DO [facility]   07/15/2023 1:15 PM

## 2023-07-15 NOTE — Progress Notes (Unsigned)
 A/O x 3, gd SR's, VSS, report to RN

## 2023-07-15 NOTE — Patient Instructions (Signed)

## 2023-07-16 ENCOUNTER — Encounter: Payer: Self-pay | Admitting: Gastroenterology

## 2023-07-16 ENCOUNTER — Telehealth: Payer: Self-pay

## 2023-07-16 NOTE — Telephone Encounter (Signed)
  Follow up Call-     07/15/2023   12:55 PM  Call back number  Post procedure Call Back phone  # [phone]  Permission to leave phone message Yes    Post op call attempted, no answer, left VM.

## 2023-07-17 DIAGNOSIS — G4733 Obstructive sleep apnea (adult) (pediatric): Secondary | ICD-10-CM | POA: Diagnosis not present

## 2023-07-18 LAB — SURGICAL PATHOLOGY

## 2023-07-19 ENCOUNTER — Encounter: Payer: Self-pay | Admitting: Gastroenterology

## 2023-07-19 ENCOUNTER — Ambulatory Visit: Payer: Commercial Managed Care - PPO | Admitting: Rehabilitative and Restorative Service Providers"

## 2023-07-19 ENCOUNTER — Encounter: Payer: Self-pay | Admitting: Rehabilitative and Restorative Service Providers"

## 2023-07-19 DIAGNOSIS — R293 Abnormal posture: Secondary | ICD-10-CM | POA: Diagnosis not present

## 2023-07-19 DIAGNOSIS — M5416 Radiculopathy, lumbar region: Secondary | ICD-10-CM

## 2023-07-19 DIAGNOSIS — M5459 Other low back pain: Secondary | ICD-10-CM | POA: Diagnosis not present

## 2023-07-19 NOTE — Therapy (Signed)
 OUTPATIENT PHYSICAL THERAPY THORACOLUMBAR TREATMENT/PROGRESS NOTE  PHYSICAL THERAPY DISCHARGE SUMMARY  Visits from Start of Care: 4  Current functional level related to goals / functional outcomes: See note   Remaining deficits: See note   Education / Equipment: Updated HEP   Patient agrees to discharge. Patient goals were  mostly met . Patient is being discharged due to being pleased with the current functional level.    Patient Name: Andrew Gray MRN: [id number] DOB:[birth year], 43 y.o., male Today's Date: 07/19/2023  END OF SESSION:  PT End of Session - 07/19/23 1619     Visit Number 4    Number of Visits 11    Date for PT Re-Evaluation 08/09/23    Progress Note Due on Visit 11    PT Start Time 1517    PT Stop Time 1604    PT Time Calculation (min) 47 min    Activity Tolerance Patient tolerated treatment well;No increased pain    Behavior During Therapy WFL for tasks assessed/performed              Past Medical History:  Diagnosis Date   ADHD    Back pain    Fatty liver    GERD (gastroesophageal reflux disease)    Gout    Hypertension    Joint pain    Lactose intolerance    OSA (obstructive sleep apnea)    Seasonal allergies    Sleep apnea    Past Surgical History:  Procedure Laterality Date   ELBOW SURGERY Left    KNEE SURGERY     VASECTOMY     Patient Active Problem List   Diagnosis Date Noted   Anxiety 05/31/2023   Hemorrhoid prolapse 02/14/2023   Rash 11/12/2022   GERD (gastroesophageal reflux disease) 11/11/2022   Hepatic steatosis 04/08/2020   Obesity (BMI 30-39.9) 03/01/2020   OSA (obstructive sleep apnea) 12/30/2019   Right hip osteoarthritis and labral tearing 01/23/2018   Rosacea 10/11/2017   Adult ADHD 10/11/2017   Hyperlipidemia 02/23/2015   Essential hypertension, benign 02/23/2015   Gout 02/16/2015   Spondylolisthesis at L5-S1 level 02/15/2015    PCP: Pincus Sanes, MD  REFERRING PROVIDER: Richardean Sale,  DO  REFERRING DIAG: [phone] (ICD-10-CM) - Spondylolisthesis at L5-S1 level M54.42,G89.29 (ICD-10-CM) - Chronic left-sided low back pain with left-sided sciatica  Rationale for Evaluation and Treatment: Rehabilitation  THERAPY DIAG:  Abnormal posture  Radiculopathy, lumbar region  Other low back pain  ONSET DATE: Chronic (at least 2019) with left sided symptoms worsening over the past few weeks.  SUBJECTIVE:  SUBJECTIVE STATEMENT: Andrew Gray notes he wasn't feeling his left lateral calf symptoms much over the past week.  He spent an hour and a half in the car yesterday helping his daughter with her learner's permit.  This did result in a flare-up in his peripheral symptoms   Eval: Andrew Gray notes left lateral calf pain of at least 6 years duration.  Symptoms started getting worse over the last few weeks with no specific reason for exacerbation noted.  He treadmill walks for exercise.  Pain can be bad at night.  Hamstrings tightness is noted.  1 week ago today had an epidural.    [facility]  PERTINENT HISTORY:  HTN, Gout, Lt elbow and knee surgeries  PAIN:  Are you having pain? Yes: NPRS scale: 0-4/10 this week, 4/10 with 1.5 hours in the car Pain location: Left lateral thigh and left knee to the ankle Pain description: Painful (toothache) Aggravating factors: Leg up, at night Relieving factors: Some relief with gabapentin and an epidural  PRECAUTIONS: Back  RED FLAGS: None   WEIGHT BEARING RESTRICTIONS: No  FALLS:  Has patient fallen in last 6 months? No  OCCUPATION: Sports Medicine MD  PLOF: Independent  PATIENT GOALS: Get rid of left lateral calf pain and improve low back stiffness and pain  NEXT MD VISIT: ?  OBJECTIVE:  Note: Objective measures were completed at Evaluation unless otherwise  noted.  DIAGNOSTIC FINDINGS:  From 2019 MRI:  No change in bilateral L5 pars interarticularis defects resulting in 0.7 cm anterolisthesis L5 on S1. Moderately severe left foraminal narrowing at L5-S1 due to anterolisthesis and a small disc protrusion has worsened since the prior examination. The exiting left L5 root appears compressed in the foramen. Mild to moderate right foraminal narrowing appears unchanged.   Annular fissure and very shallow central protrusion at L4-5 without stenosis, unchanged.  PATIENT SURVEYS:  07/19/2022 FOTO 76 (Goal met)  Eval: FOTO 60 (risk-adjusted 51, Goal 68 in 11 visits)  COGNITION: Overall cognitive status: Within functional limits for tasks assessed     SENSATION: Significant for left lateral calf "pain"  MUSCLE LENGTH: 07/19/2023 Hamstrings: Right 45 deg; Left 45 deg  07/05/2023 Hamstrings: Right 45 deg; Left 40 deg  Eval Hamstrings: Right 35 deg; Left 35 deg  POSTURE: rounded shoulders, forward head, and decreased lumbar lordosis  LUMBAR ROM:   AROM 06/14/2023 07/05/2023  Flexion    Extension 10 20  Right lateral flexion    Left lateral flexion    Right rotation    Left rotation     (Blank rows = not tested)  LOWER EXTREMITY ROM:     Passive  Left/Right 06/14/2023 Left/Right 07/05/2023 Left/Right 07/19/2023  Hip flexion 85/85 85/95 100/100  Hip extension     Hip abduction     Hip adduction     Hip internal rotation 15/10 15/11 7/8  Hip external rotation 16/23 26/30 22/30   Knee flexion     Knee extension     Ankle dorsiflexion     Ankle plantarflexion     Ankle inversion     Ankle eversion      (Blank rows = not tested)  STRENGTH:  -  MMT (out of 5) or hand held dynamometer in pounds 06/14/2023 07/19/2023  Hip flexion    Hip extension    Hip abduction    Hip adduction    Hip internal rotation    Hip external rotation    Knee flexion    Knee extension    Ankle  dorsiflexion    Ankle plantarflexion 5-/5 5-/5  Ankle  inversion 68.5/68.1 pounds   Ankle eversion 67.5/67.9 pounds   Lumbar extension 60 seconds     (Blank rows = not tested)  TREATMENT DATE:  07/19/2023 Standing limited range lumbar extension 5 x 3 seconds Contract-relax supine hamstrings stretch 4 x 20 seconds (push other leg into mat)    Figure 4 stretch 4 x 20 seconds Modified Thomas stretch 4 times 20 seconds Hip abduction with pelvic stabilization 2 sets of 5 for 5 seconds (hike, toe in and push heel into door frame) Prone alternating hip extension 10 x 5 seconds Prone alternating arm and leg extensions 10 x 5 seconds  Reviewed HEP changes, log roll for bed mobility, reviewed the importance of avoiding prolonged seated, flexed and slouched postures, discussed disc pressures in various positions and the benefits of combining a lumbar roll with a reclined seat when sitting for longer periods of time   07/12/2023 Standing limited range lumbar extension 10 x 3 seconds Contract-relax supine hamstrings stretch 4 x 20 seconds (push other leg into mat)    Figure 4 stretch 4 x 20 seconds Modified Thomas stretch 4 times 20 seconds Hip hike with shoulder in doorframe 5 for 5 seconds Hip abduction with pelvic stabilization 2 sets of 5 for 5 seconds (hike, toe in and push heel into door frame) Prone alternating hip extension 10 x 5 seconds Prone alternating arm and leg extensions 10 x 5 seconds  Reviewed HEP changes, log roll for bed mobility, reviewed the importance of avoiding prolonged seated, flexed and slouched postures    07/05/2023 Standing limited range lumbar extension 10 x 3 seconds Supine hamstrings stretch 5 x 20 seconds (push other leg into mat)    Supine single knee to chest stretch 3 x 20 seconds (other leg pushed into table) Modified Thomas stretch 1 time 20 seconds Thomas stretch 1 time 20 seconds Hip hike with shoulder in doorframe 2 sets of 5 for 3 seconds Prone alternating hip extension 10 x 3 seconds  Reviewed exam  findings, HEP changes, log roll for bed mobility, reviewed the importance of avoiding prolonged seated, flexed and slouched postures and again reviewed the 2019 imaging with the spine model.   06/14/2023 Standing limited range lumbar extension 10 x 3 seconds Supine hamstrings stretch 3 x 20 seconds (push other leg into mat)    Standing hamstrings stretch 2 x 20 (nice inward lumbar curve and slight bend in knee)  Standing hip flexors stretch with foot in chair and slight turn towards the foot in the chair 3 x 20 seconds Supine figure 4 stretch 3 x 20 seconds (push away, tried knee to chest but too tight)  Reviewed exam findings, HEP and 2019 imaging with spine model                                                                                                             PATIENT EDUCATION:  Education details: See note Person educated: Patient Education method: Explanation, Demonstration, Tactile cues,  Verbal cues, and Handouts Education comprehension: verbalized understanding, returned demonstration, verbal cues required, tactile cues required, and needs further education  HOME EXERCISE PROGRAM: Access Code: [email or web address] URL: [email or web address] Date: 07/19/2023 Prepared by: Pauletta Browns  Exercises - Standing Lumbar Extension at Wall - Forearms  - 2-5 x daily - 7 x weekly - 1 sets - 5 reps - 3 seconds hold - Supine Hamstring Stretch  - 1 x daily - 3-7 x weekly - 1 sets - 4-5 reps - 10-15 seconds hold - Hip Flexor Stretch with Chair  - 2-3 x daily - 1 x weekly - 1 sets - 5 reps - 20 seconds hold - Supine Figure 4 Piriformis Stretch  - 1 x daily - 3-7 x weekly - 1 sets - 4-5 reps - 10-15 seconds hold - Modified Thomas Stretch  - 1 x daily - 3-7 x weekly - 1 sets - 4-5 reps - 10-15 seconds hold - Standing Hip Hiking  - 3 x daily - 3-7 x weekly - 1 sets - 10 reps - 3 seconds hold - Prone Hip Extension  - 1 x daily - 3-7 x weekly - 2 sets - 10 reps - 5-10 seconds hold - Prone  Alternating Arm and Leg Lifts  - 1 x daily - 3-7 x weekly - 2 sets - 10 reps - 5-10 seconds hold  ASSESSMENT:  CLINICAL IMPRESSION: Andrew Gray had noticeably less left-sided gluteal and lateral calf symptoms over the past week.  He did note a flare-up of his lateral calf symptoms after an hour and a half of sitting in the car helping his daughter who is working on her learner's permit.  This flare-up was likely due to to the prolonged sitting and Andrew Gray and I discussed disc pressures in various positions and how correct use of a lumbar roll and a 20 degree reclined and supported posture might make things easier in the future.  He is aware that prolonged sitting, flexed postures and the combination of flexion and rotation are things he should be avoiding long-term.  Andrew Gray demonstrated solid knowledge of his current home exercise program which is addressing his remaining impairments in flexibility and strength and this program should serve as a good long-term home maintenance program to be completed on an at least 3 times per week basis (completed daily would be ideal).  Andrew Gray feels confident with his current program and will continue his exercises independently.  I encouraged him to contact me with any questions, concerns or if he wants to return to supervised rehabilitation if his current progress does not continue.   Patient is a 43 y.o. male who was seen today for physical therapy evaluation and treatment for M43.17 (ICD-10-CM) - Spondylolisthesis at L5-S1 level M54.42,G89.29 (ICD-10-CM) - Chronic left-sided low back pain with left-sided sciatica.  Andrew Gray has a (at least) 6 year history of left lateral leg pain that has been noticeably increased over the past few weeks.  He notes no particular reason for the recent exacerbation.  Significant clinical findings included slight limitations in lumbar extension AROM, very tight hip flexors, hamstrings and hip external rotation active range of motion along with postural  impairments as noted above.  2019 MRI (the most recent MRI available) also shows a 0.7 cm anterolisthesis L5 on S1, moderately severe left foraminal narrowing at L5-S1 and a small disc protrusion that may be compressing the L5 nerve root.  As far as weakness, there may be some slight S1 weakness on the left as assessed by  single-leg heel raise, although this was not obvious.  Clinically, I believe Andrew Gray will benefit most from postural correction, appropriate lumbar AROM interventions and most importantly hip flexibility activities to reduce strain on his low back and hopefully relieve pressure on the irritated nerve root.  OBJECTIVE IMPAIRMENTS: decreased ROM, impaired perceived functional ability, impaired flexibility, postural dysfunction, and pain.   ACTIVITY LIMITATIONS: sleeping  PARTICIPATION LIMITATIONS: community activity  PERSONAL FACTORS: HTN, Gout, Lt elbow and knee surgeries are also affecting patient's functional outcome.   REHAB POTENTIAL: Good  CLINICAL DECISION MAKING: Stable/uncomplicated  EVALUATION COMPLEXITY: Low   GOALS: Goals reviewed with patient? Yes  SHORT TERM GOALS: Target date: 07/12/2023  Andrew Gray will be independent with his day 1 home exercise program Baseline: Started 06/14/2023 Goal status: Met 07/12/2023  2.  Improve lumbar extension AROM to 15 degrees Baseline: 10 degrees Goal status: Met 07/19/2023  3.  Improve bilateral lower extremity flexibility for hip flexors to 95 degrees, hamstrings to 45 degrees and hip external rotation to at least 25 degrees Baseline: 85/85; 35/35; 16/23 Goal status: Partially Met 07/19/2023   LONG TERM GOALS: Target date: 08/09/2023  Improve FOTO to 68 in 11 visits Baseline: 60, risk adjusted 51 Goal status: Met 07/19/2023  2.  Improve low back and left lateral leg pain to 0-2/10 on the visual analog scale Baseline: 0-5/10 (occasional gabapentin at night) Goal status: On Going 07/19/2023  3.  Improve bilateral hip flexors  flexibility to at least 100 degrees, hamstrings to 50 degrees and hip external rotation to 40 degrees Baseline: See short-term goals Goal status: Partially Met 07/19/2023  4.  Andrew Gray will be independent with his long-term home maintenance exercise program at discharge Baseline: Started 06/14/2023 Goal status: Met 07/19/2023   PLAN:  PT FREQUENCY: DC  PT DURATION: DC  PLANNED INTERVENTIONS: 97110-Therapeutic exercises, 97530- Therapeutic activity, 97112- Neuromuscular re-education, 97535- Self Care, 16109- Manual therapy, 97012- Traction (mechanical), Patient/Family education, Dry Needling, Joint mobilization, Cryotherapy, and Moist heat.  PLAN FOR NEXT SESSION: DC   Cherlyn Cushing, PT, MPT 07/19/2023, 4:31 PM

## 2023-07-22 ENCOUNTER — Encounter: Payer: Self-pay | Admitting: Internal Medicine

## 2023-07-22 DIAGNOSIS — Z860100 Personal history of colon polyps, unspecified: Secondary | ICD-10-CM | POA: Insufficient documentation

## 2023-07-25 ENCOUNTER — Other Ambulatory Visit: Payer: Self-pay | Admitting: Sports Medicine

## 2023-07-25 ENCOUNTER — Ambulatory Visit (INDEPENDENT_AMBULATORY_CARE_PROVIDER_SITE_OTHER)

## 2023-07-25 DIAGNOSIS — M25552 Pain in left hip: Secondary | ICD-10-CM | POA: Diagnosis not present

## 2023-07-25 DIAGNOSIS — M5442 Lumbago with sciatica, left side: Secondary | ICD-10-CM | POA: Diagnosis not present

## 2023-07-25 DIAGNOSIS — M4317 Spondylolisthesis, lumbosacral region: Secondary | ICD-10-CM

## 2023-07-25 DIAGNOSIS — M79605 Pain in left leg: Secondary | ICD-10-CM

## 2023-07-25 DIAGNOSIS — G8929 Other chronic pain: Secondary | ICD-10-CM | POA: Diagnosis not present

## 2023-07-25 DIAGNOSIS — M545 Low back pain, unspecified: Secondary | ICD-10-CM | POA: Diagnosis not present

## 2023-07-25 NOTE — Progress Notes (Signed)
 Continued back pain with pain radiating to left leg. We will obtain xray imaging before office visit and can further discuss advanced imaging at follow up visit.

## 2023-07-26 ENCOUNTER — Ambulatory Visit (INDEPENDENT_AMBULATORY_CARE_PROVIDER_SITE_OTHER): Admitting: Family Medicine

## 2023-07-26 ENCOUNTER — Other Ambulatory Visit (HOSPITAL_COMMUNITY): Payer: Self-pay

## 2023-07-26 VITALS — Ht 72.0 in

## 2023-07-26 DIAGNOSIS — M545 Low back pain, unspecified: Secondary | ICD-10-CM

## 2023-07-26 DIAGNOSIS — M4317 Spondylolisthesis, lumbosacral region: Secondary | ICD-10-CM

## 2023-07-26 DIAGNOSIS — M25552 Pain in left hip: Secondary | ICD-10-CM

## 2023-07-26 MED ORDER — GABAPENTIN 300 MG PO CAPS
300.0000 mg | ORAL_CAPSULE | Freq: Every day | ORAL | 0 refills | Status: DC
  Filled 2023-07-26: qty 90, 90d supply, fill #0

## 2023-07-26 MED ORDER — PREDNISONE 50 MG PO TABS
50.0000 mg | ORAL_TABLET | Freq: Every day | ORAL | 0 refills | Status: DC
  Filled 2023-07-26: qty 5, 5d supply, fill #0

## 2023-07-26 NOTE — Progress Notes (Signed)
 Marland Kitchen

## 2023-07-26 NOTE — Progress Notes (Signed)
 [facility] [address] [place] [address] Phone: [phone] Subjective:    I'm seeing this patient by the request  of:  Pincus Sanes, MD  CC: Low back pain with left leg weakness  [id number]  Andrew Gray is a 43 y.o. male coming in with complaint of low back pain.  Has been seen previously and has had difficulty with his back dating back to 2016.  Has responded well to epidural nerve root injections and formal physical therapy.  Over the last 2 months having worsening pain and has been doing physical therapy notices recently that he continues to have constant radicular symptoms down the lateral aspect of the calf and leg.  An impingement type symptoms of the left hip.  Waking him up at night, taking gabapentin 300 nightly.  Daily activities becoming more difficult.     Past Medical History:  Diagnosis Date   ADHD    Back pain    Fatty liver    GERD (gastroesophageal reflux disease)    Gout    Hypertension    Joint pain    Lactose intolerance    OSA (obstructive sleep apnea)    Seasonal allergies    Sleep apnea    Past Surgical History:  Procedure Laterality Date   ELBOW SURGERY Left    KNEE SURGERY     VASECTOMY     Social History   Socioeconomic History   Marital status: Married    Spouse name: Not on file   Number of children: 0   Years of education: Not on file   Highest education level: Doctorate  Occupational History   Occupation: Doctor  Tobacco Use   Smoking status: Never    Passive exposure: Never   Smokeless tobacco: Never  Vaping Use   Vaping status: Never Used  Substance and Sexual Activity   Alcohol use: Yes    Alcohol/week: 0.0 standard drinks of alcohol   Drug use: No   Sexual activity: Yes    Partners: Female  Other Topics Concern   Not on file  Social History Narrative   Not on file   Social Drivers of Health   Financial Resource Strain: Low Risk  (05/29/2023)   Overall Financial Resource  Strain (CARDIA)    Difficulty of Paying Living Expenses: Not hard at all  Food Insecurity: No Food Insecurity (05/29/2023)   Hunger Vital Sign    Worried About Running Out of Food in the Last Year: Never true    Ran Out of Food in the Last Year: Never true  Transportation Needs: No Transportation Needs (05/29/2023)   PRAPARE - Administrator, Civil Service (Medical): No    Lack of Transportation (Non-Medical): No  Physical Activity: Insufficiently Active (05/29/2023)   Exercise Vital Sign    Days of Exercise per Week: 3 days    Minutes of Exercise per Session: 20 min  Stress: Stress Concern Present (05/29/2023)   [employer] of Occupational Health - Occupational Stress Questionnaire    Feeling of Stress : Rather much  Social Connections: Socially Isolated (05/29/2023)   Social Connection and Isolation Panel [NHANES]    Frequency of Communication with Friends and Family: Never    Frequency of Social Gatherings with Friends and Family: Never    Attends Religious Services: Never    Database administrator or Organizations: No    Attends Engineer, structural: Not on file    Marital Status: Married  Allergies  Allergen Reactions   Sulfa Antibiotics Nausea And Vomiting   Family History  Problem Relation Age of Onset   Anxiety disorder Mother    Colon cancer Neg Hx    Liver disease Neg Hx    Esophageal cancer Neg Hx      Current Outpatient Medications (Cardiovascular):    amLODipine (NORVASC) 10 MG tablet, Take 1 tablet (10 mg total) by mouth daily.   hydrochlorothiazide (HYDRODIURIL) 25 MG tablet, Take 1 tablet (25 mg total) by mouth daily.   rosuvastatin (CRESTOR) 5 MG tablet, Take 1 tablet (5 mg total) by mouth daily.   tadalafil (CIALIS) 20 MG tablet, Take 1 tablet (20 mg total) by mouth daily as needed for erectile dysfunction (please use troches).  Current Outpatient Medications (Respiratory):    cetirizine (ZYRTEC) 10 MG tablet, Take 10 mg by mouth  daily.  Current Outpatient Medications (Analgesics):    Febuxostat 80 MG TABS, Take 1 tablet (80 mg total) by mouth daily.   Current Outpatient Medications (Other):    betamethasone valerate (VALISONE) 0.1 % cream, Apply 1 Application topically daily.   desonide (DESOWEN) 0.05 % ointment, Apply 1 Application topically to eyelids  (two) times daily for 7 days.   FLUoxetine (PROZAC) 20 MG capsule, Take 1 capsule (20 mg total) by mouth daily.   methylphenidate (METADATE CD) 30 MG CR capsule, Take 1 capsule (30 mg total) by mouth daily.   pantoprazole (PROTONIX) 40 MG tablet, Take 1 tablet by mouth daily.   traZODone (DESYREL) 50 MG tablet, Take 1 to 2 tablets by mouth at bedtime.   triamcinolone cream (KENALOG) 0.1 %, Apply 1 Application topically 2 (two) times daily.   Vitamin D, Ergocalciferol, (DRISDOL) 1.25 MG (50000 UNIT) CAPS capsule, Take 1 capsule (50,000 Units total) by mouth once a week.   Reviewed prior external information including notes and imaging from  primary care provider As well as notes that were available from care everywhere and other healthcare systems.  Past medical history, social, surgical and family history all reviewed in electronic medical record.  No pertanent information unless stated regarding to the chief complaint.   Review of Systems:  No headache, visual changes, nausea, vomiting, diarrhea, constipation, dizziness, abdominal pain, skin rash, fevers, chills, night sweats, weight loss, swollen lymph nodes, body aches, joint swelling, chest pain, shortness of breath, mood changes. POSITIVE muscle aches  Objective  Height 6' (1.829 m).   General: No apparent distress alert and oriented x3 mood and affect normal, dressed appropriately.  HEENT: Pupils equal, extraocular movements intact  Respiratory: Patient's speak in full sentences and does not appear short of breath  Cardiovascular: No lower extremity edema, non tender, no erythema  Left hip exam shows a  patient does have decreased internal range of motion.  Audible popping noted.  Mild antalgic gait noted.  Patient though does have a positive Trendelenburg noted.  Weakness with eversion of the ankle against resistance on the left compared to the right.  Positive straight leg test at 25 degrees on the left side in the L5 radicular symptoms.  Deep tendon reflexes are intact.    Impression and Recommendations:

## 2023-07-26 NOTE — Assessment & Plan Note (Signed)
 Significantly concerned with worsening pain.  Most recent x-rays do show potentially significant worsening of the degenerative disc disease at L5-S1 and still has a grade 1 spondylolisthesis noted.  Concerned that there is some acute herniation that could be potentially causing nerve impingement.  Patient did have a an epidural and has done formal physical therapy for the last 3 months with no improvement and if anything worsening.  Pain and weakness affecting daily activities.  At this point do feel that advanced imaging is warranted including an MRI of the lumbar spine and MR arthrogram of the hip to further evaluate.  Depending on findings we will discuss different management.

## 2023-07-28 ENCOUNTER — Other Ambulatory Visit (HOSPITAL_COMMUNITY): Payer: Self-pay

## 2023-08-02 ENCOUNTER — Ambulatory Visit: Admitting: Sports Medicine

## 2023-08-08 ENCOUNTER — Encounter: Payer: Self-pay | Admitting: Sports Medicine

## 2023-08-11 ENCOUNTER — Other Ambulatory Visit (HOSPITAL_COMMUNITY): Payer: Self-pay

## 2023-08-14 ENCOUNTER — Ambulatory Visit
Admission: RE | Admit: 2023-08-14 | Discharge: 2023-08-14 | Disposition: A | Discharge status: Home / Self Care | Source: Ambulatory Visit | Attending: Family Medicine | Admitting: Family Medicine

## 2023-08-14 ENCOUNTER — Encounter: Payer: Self-pay | Admitting: Family Medicine

## 2023-08-14 DIAGNOSIS — M5126 Other intervertebral disc displacement, lumbar region: Secondary | ICD-10-CM | POA: Diagnosis not present

## 2023-08-14 DIAGNOSIS — M25552 Pain in left hip: Secondary | ICD-10-CM

## 2023-08-14 DIAGNOSIS — M1612 Unilateral primary osteoarthritis, left hip: Secondary | ICD-10-CM | POA: Diagnosis not present

## 2023-08-14 DIAGNOSIS — M545 Low back pain, unspecified: Secondary | ICD-10-CM

## 2023-08-14 DIAGNOSIS — M4316 Spondylolisthesis, lumbar region: Secondary | ICD-10-CM | POA: Diagnosis not present

## 2023-08-14 MED ORDER — IOPAMIDOL (ISOVUE-M 200) INJECTION 41%
10.0000 mL | Freq: Once | INTRAMUSCULAR | Status: AC
  Administered 2023-08-14: 10 mL via INTRA_ARTICULAR

## 2023-08-26 ENCOUNTER — Other Ambulatory Visit (HOSPITAL_COMMUNITY): Payer: Self-pay

## 2023-09-03 ENCOUNTER — Other Ambulatory Visit (HOSPITAL_COMMUNITY): Payer: Self-pay

## 2023-09-03 ENCOUNTER — Encounter: Payer: Self-pay | Admitting: Family Medicine

## 2023-09-04 ENCOUNTER — Other Ambulatory Visit (HOSPITAL_COMMUNITY): Payer: Self-pay

## 2023-09-04 MED ORDER — METHYLPHENIDATE HCL ER (CD) 30 MG PO CPCR
30.0000 mg | ORAL_CAPSULE | Freq: Every day | ORAL | 0 refills | Status: DC
  Filled 2023-09-04: qty 30, 30d supply, fill #0

## 2023-09-06 DIAGNOSIS — E66812 Obesity, class 2: Secondary | ICD-10-CM | POA: Diagnosis not present

## 2023-09-06 DIAGNOSIS — G4733 Obstructive sleep apnea (adult) (pediatric): Secondary | ICD-10-CM | POA: Diagnosis not present

## 2023-09-06 DIAGNOSIS — R632 Polyphagia: Secondary | ICD-10-CM | POA: Diagnosis not present

## 2023-09-06 DIAGNOSIS — Z6836 Body mass index (BMI) 36.0-36.9, adult: Secondary | ICD-10-CM | POA: Diagnosis not present

## 2023-09-06 DIAGNOSIS — I10 Essential (primary) hypertension: Secondary | ICD-10-CM | POA: Diagnosis not present

## 2023-09-11 NOTE — Progress Notes (Unsigned)
 Referring Physician:  No referring provider defined for this encounter.  Primary Physician:  Colene Dauphin, MD  History of Present Illness: 09/12/2023 Mr. Marvell Tollefson is here today with a chief complaint of occasional back pain with left leg pain.  He gets pain into his left hip radiating down his left lateral thigh and left lateral calf and occasionally to the top of his foot.  He gets intermittent numbness and tingling in his legs.  This has been ongoing for 5 years but worsening over the past 4 to 5 months.  Extension of his back and standing make it worse.  Sitting and flexion improve his symptoms.  He has noted that his symptoms have been progressively worsening over time.  He has tried physical therapy without improvement.  He has tried NSAIDs for more than 6 weeks without improvement.  Bowel/Bladder Dysfunction: none  Conservative measures:  Physical therapy: has participated in PT at [facility] from 06/14/23 to 07/19/23 and was discharged  Multimodal medical therapy including regular antiinflammatories: Prednisone , Gabapentin   Injections:  03/01/2017 L5-S1 left L5 nerve root block and transforaminal epidural  02/14/2018 left L5-S1 ESI 06/28/23: Left L5-S1 TF ESI 08/14/23: Left Hip injection  Past Surgery: no spinal surgeries  Syliva Even has no symptoms of cervical myelopathy.  The symptoms are causing a significant impact on the patient's life.   I have utilized the care everywhere function in epic to review the outside records available from external health systems.  Review of Systems:  A 10 point review of systems is negative, except for the pertinent positives and negatives detailed in the HPI.  Past Medical History: Past Medical History:  Diagnosis Date   ADHD    Back pain    Fatty liver    GERD (gastroesophageal reflux disease)    Gout    Hypertension    Joint pain    Lactose intolerance    OSA (obstructive sleep apnea)    Seasonal allergies    Sleep apnea      Past Surgical History: Past Surgical History:  Procedure Laterality Date   ELBOW SURGERY Left    KNEE SURGERY     VASECTOMY      Allergies: Allergies as of 09/12/2023 - Review Complete 09/12/2023  Allergen Reaction Noted   Sulfa antibiotics Nausea And Vomiting 02/23/2015    Medications:  Current Outpatient Medications:    amLODipine  (NORVASC ) 10 MG tablet, Take 1 tablet (10 mg total) by mouth daily., Disp: 90 tablet, Rfl: 3   betamethasone  valerate (VALISONE ) 0.1 % cream, Apply 1 Application topically daily., Disp: 45 g, Rfl: 0   cetirizine (ZYRTEC) 10 MG tablet, Take 10 mg by mouth daily., Disp: , Rfl:    desonide  (DESOWEN ) 0.05 % ointment, Apply 1 Application topically to eyelids  (two) times daily for 7 days., Disp: 30 g, Rfl: 0   Febuxostat  80 MG TABS, Take 1 tablet (80 mg total) by mouth daily., Disp: 90 tablet, Rfl: 3   FLUoxetine  (PROZAC ) 20 MG capsule, Take 1 capsule (20 mg total) by mouth daily., Disp: 30 capsule, Rfl: 5   gabapentin  (NEURONTIN ) 300 MG capsule, Take 1 capsule (300 mg total) by mouth at bedtime., Disp: 90 capsule, Rfl: 0   hydrochlorothiazide  (HYDRODIURIL ) 25 MG tablet, Take 1 tablet (25 mg total) by mouth daily., Disp: 90 tablet, Rfl: 3   methylphenidate  (METADATE  CD) 30 MG CR capsule, Take 1 capsule (30 mg total) by mouth daily., Disp: 30 capsule, Rfl: 0   pantoprazole  (  PROTONIX ) 40 MG tablet, Take 1 tablet by mouth daily., Disp: 90 tablet, Rfl: 3   predniSONE  (DELTASONE ) 50 MG tablet, Take 1 tablet (50 mg total) by mouth daily., Disp: 5 tablet, Rfl: 0   rosuvastatin  (CRESTOR ) 5 MG tablet, Take 1 tablet (5 mg total) by mouth daily., Disp: 90 tablet, Rfl: 3   tadalafil  (CIALIS ) 20 MG tablet, Take 1 tablet (20 mg total) by mouth daily as needed for erectile dysfunction (please use troches)., Disp: 30 tablet, Rfl: 11   traZODone  (DESYREL ) 50 MG tablet, Take 1 to 2 tablets by mouth at bedtime., Disp: 60 tablet, Rfl: 3   triamcinolone  cream (KENALOG ) 0.1 %,  Apply 1 Application topically 2 (two) times daily., Disp: 30 g, Rfl: 0   Vitamin D , Ergocalciferol , (DRISDOL ) 1.25 MG (50000 UNIT) CAPS capsule, Take 1 capsule (50,000 Units total) by mouth once a week., Disp: 12 capsule, Rfl: 0  Social History: Social History   Tobacco Use   Smoking status: Never    Passive exposure: Never   Smokeless tobacco: Never  Vaping Use   Vaping status: Never Used  Substance Use Topics   Alcohol use: Yes    Alcohol/week: 0.0 standard drinks of alcohol   Drug use: No    Family Medical History: Family History  Problem Relation Age of Onset   Anxiety disorder Mother    Colon cancer Neg Hx    Liver disease Neg Hx    Esophageal cancer Neg Hx     Physical Examination: Vitals:   09/12/23 0827  BP: 130/78    General: Patient is in no apparent distress. Attention to examination is appropriate.  Neck:   Supple.  Full range of motion.  Respiratory: Patient is breathing without any difficulty.   NEUROLOGICAL:     Awake, alert, oriented to person, place, and time.  Speech is clear and fluent.   Cranial Nerves: Pupils equal round and reactive to light.  Facial tone is symmetric.  Facial sensation is symmetric. Shoulder shrug is symmetric. Tongue protrusion is midline.  There is no pronator drift.  Strength: Side Biceps Triceps Deltoid Interossei Grip Wrist Ext. Wrist Flex.  R 5 5 5 5 5 5 5   L 5 5 5 5 5 5 5    Side Iliopsoas Quads Hamstring PF DF EHL  R 5 5 5 5 5 5   L 5 5 5 5 5 5    Reflexes are 1+ and symmetric at the biceps, triceps, brachioradialis, patella and achilles.   Hoffman's is absent.   Bilateral upper and lower extremity sensation is intact to light touch.    No evidence of dysmetria noted.  Gait is normal.    FABER negative bilaterally  Medical Decision Making  Imaging: MRI L spine 08/14/2023 IMPRESSION: 1. Slight progression of grade 1 anterolisthesis at L5-S1 secondary to bilateral L5 pars interarticularis defects. 2.  Stable moderate foraminal narrowing bilaterally at L5-S1, left greater than right. 3. Stable shallow central disc protrusion and annular tear at L4-5 without significant central or foraminal stenosis. 4. Mild disc desiccation and bulging at L2-3 and L3-4 without significant focal stenosis.     Electronically Signed   By: Audree Leas M.D.   On: 08/14/2023 16:06  L spine xrays 07/25/2023 IMPRESSION: Chronic pars defect of L5 noted with grade 1 anterolisthesis of L5 on S1 unchanged compared to prior exam.     Electronically Signed   By: Anna Barnes M.D.   On: 08/01/2023 16:05  I have personally reviewed the  images and agree with the above interpretation.  Assessment and Plan: Mr. Tremel is a pleasant 43 y.o. male with L5 pars defect causing anterolisthesis of L5 on S1.  He has foraminal narrowing at L5-S1 that is worse on the left than the right.  He has left-sided sciatica secondary to this.  He has moderate stenosis with compression of his left L5 nerve root.  He has back pain with left-sided sciatica.  Over the past 9 years, his spondylolisthesis has worsened and his disc height has diminished.  His findings have been progressive over time which imply relative instability.  At this time, he has tried physical therapy without improvement.  He has exhausted conservative measures.  I recommended surgical intervention.  I think he is a good candidate for a left-sided minimally invasive transforaminal lumbar interbody fusion.  The other alternative would be an anterior or oblique lumbar interbody fusion, but that would harbor  some risk of sexual dysfunction.  We discussed this, and he would like to move forward with transforaminal lumbar interbody fusion.  He is seeing one of the sports medicine orthopedist tomorrow.  I will touch base with him and wait for his decision on when he would like to proceed.  I discussed the planned procedure at length with the patient, including the  risks, benefits, alternatives, and indications. The risks discussed include but are not limited to bleeding, infection, need for reoperation, spinal fluid leak, stroke, vision loss, anesthetic complication, coma, paralysis, and even death. I also described in detail that improvement was not guaranteed.  The patient expressed understanding of these risks. I described the surgery in layman's terms, and gave ample opportunity for questions, which were answered to the best of my ability.  I spent a total of 30 minutes in this patient's care today. This time was spent reviewing pertinent records including imaging studies, obtaining and confirming history, performing a directed evaluation, formulating and discussing my recommendations, and documenting the visit within the medical record.      Thank you for involving me in the care of this patient.      Jaycelyn Orrison K. Mont Antis MD, [facility]

## 2023-09-12 ENCOUNTER — Ambulatory Visit: Admitting: Neurosurgery

## 2023-09-12 ENCOUNTER — Encounter: Payer: Self-pay | Admitting: Neurosurgery

## 2023-09-12 VITALS — BP 130/78 | Ht 72.0 in | Wt 272.0 lb

## 2023-09-12 DIAGNOSIS — M5442 Lumbago with sciatica, left side: Secondary | ICD-10-CM | POA: Diagnosis not present

## 2023-09-12 DIAGNOSIS — M48061 Spinal stenosis, lumbar region without neurogenic claudication: Secondary | ICD-10-CM

## 2023-09-12 DIAGNOSIS — M4316 Spondylolisthesis, lumbar region: Secondary | ICD-10-CM | POA: Diagnosis not present

## 2023-09-12 DIAGNOSIS — M4310 Spondylolisthesis, site unspecified: Secondary | ICD-10-CM

## 2023-09-12 DIAGNOSIS — G8929 Other chronic pain: Secondary | ICD-10-CM

## 2023-09-12 NOTE — Addendum Note (Signed)
 Addended by: Aritza Brunet on: 09/12/2023 12:20 PM   Modules accepted: Orders

## 2023-09-13 ENCOUNTER — Ambulatory Visit (HOSPITAL_BASED_OUTPATIENT_CLINIC_OR_DEPARTMENT_OTHER): Admitting: Orthopaedic Surgery

## 2023-09-13 DIAGNOSIS — S73192A Other sprain of left hip, initial encounter: Secondary | ICD-10-CM

## 2023-09-13 NOTE — Progress Notes (Signed)
 Chief Complaint: Left hip pain     History of Present Illness:    Andrew Gray is a 43 y.o. male who presents with left hip and groin pain which has been flaring up more recently particularly in the groin.  He has been quite active and enjoys going to the gym with his children.  He has been endorsing and noticing decreased range of motion about the hip.  He does have a known history of a spondylolisthesis of the lower spine for which she is seeing Dr. Jeris Montes.  He has not had previous injections in the hip but he has been working in physical therapy to strengthen his gluteal muscles.  He is also experiencing some pain laterally about the gluteal insertion as well.  Overall this has provided relief in terms of physical therapy but he is still having persistent groin based symptoms that do periodically flareup particularly in conjunction with loss of motion.   PMH/PSH/Family History/Social History/Meds/Allergies:    Past Medical History:  Diagnosis Date   ADHD    Back pain    Fatty liver    GERD (gastroesophageal reflux disease)    Gout    Hypertension    Joint pain    Lactose intolerance    OSA (obstructive sleep apnea)    Seasonal allergies    Sleep apnea    Past Surgical History:  Procedure Laterality Date   ELBOW SURGERY Left    KNEE SURGERY     VASECTOMY     Social History   Socioeconomic History   Marital status: Married    Spouse name: Not on file   Number of children: 0   Years of education: Not on file   Highest education level: Doctorate  Occupational History   Occupation: Doctor  Tobacco Use   Smoking status: Never    Passive exposure: Never   Smokeless tobacco: Never  Vaping Use   Vaping status: Never Used  Substance and Sexual Activity   Alcohol use: Yes    Alcohol/week: 0.0 standard drinks of alcohol   Drug use: No   Sexual activity: Yes    Partners: Female  Other Topics Concern   Not on file  Social History Narrative   Not on file    Social Drivers of Health   Financial Resource Strain: Low Risk  (05/29/2023)   Overall Financial Resource Strain (CARDIA)    Difficulty of Paying Living Expenses: Not hard at all  Food Insecurity: No Food Insecurity (05/29/2023)   Hunger Vital Sign    Worried About Running Out of Food in the Last Year: Never true    Ran Out of Food in the Last Year: Never true  Transportation Needs: No Transportation Needs (05/29/2023)   PRAPARE - Administrator, Civil Service (Medical): No    Lack of Transportation (Non-Medical): No  Physical Activity: Insufficiently Active (05/29/2023)   Exercise Vital Sign    Days of Exercise per Week: 3 days    Minutes of Exercise per Session: 20 min  Stress: Stress Concern Present (05/29/2023)   [employer] of Occupational Health - Occupational Stress Questionnaire    Feeling of Stress : Rather much  Social Connections: Socially Isolated (05/29/2023)   Social Connection and Isolation Panel [NHANES]    Frequency of Communication with Friends and Family: Never    Frequency of Social Gatherings with Friends and Family: Never    Attends Religious Services: Never    Database administrator or  Organizations: No    Attends Engineer, structural: Not on file    Marital Status: Married   Family History  Problem Relation Age of Onset   Anxiety disorder Mother    Colon cancer Neg Hx    Liver disease Neg Hx    Esophageal cancer Neg Hx    Allergies  Allergen Reactions   Sulfa Antibiotics Nausea And Vomiting   Current Outpatient Medications  Medication Sig Dispense Refill   amLODipine  (NORVASC ) 10 MG tablet Take 1 tablet (10 mg total) by mouth daily. 90 tablet 3   betamethasone  valerate (VALISONE ) 0.1 % cream Apply 1 Application topically daily. 45 g 0   cetirizine (ZYRTEC) 10 MG tablet Take 10 mg by mouth daily.     desonide  (DESOWEN ) 0.05 % ointment Apply 1 Application topically to eyelids  (two) times daily for 7 days. 30 g 0   Febuxostat   80 MG TABS Take 1 tablet (80 mg total) by mouth daily. 90 tablet 3   FLUoxetine  (PROZAC ) 20 MG capsule Take 1 capsule (20 mg total) by mouth daily. 30 capsule 5   gabapentin  (NEURONTIN ) 300 MG capsule Take 1 capsule (300 mg total) by mouth at bedtime. 90 capsule 0   hydrochlorothiazide  (HYDRODIURIL ) 25 MG tablet Take 1 tablet (25 mg total) by mouth daily. 90 tablet 3   methylphenidate  (METADATE  CD) 30 MG CR capsule Take 1 capsule (30 mg total) by mouth daily. 30 capsule 0   pantoprazole  (PROTONIX ) 40 MG tablet Take 1 tablet by mouth daily. 90 tablet 3   rosuvastatin  (CRESTOR ) 5 MG tablet Take 1 tablet (5 mg total) by mouth daily. 90 tablet 3   tadalafil  (CIALIS ) 20 MG tablet Take 1 tablet (20 mg total) by mouth daily as needed for erectile dysfunction (please use troches). 30 tablet 11   traZODone  (DESYREL ) 50 MG tablet Take 1 to 2 tablets by mouth at bedtime. 60 tablet 3   triamcinolone  cream (KENALOG ) 0.1 % Apply 1 Application topically 2 (two) times daily. 30 g 0   Vitamin D , Ergocalciferol , (DRISDOL ) 1.25 MG (50000 UNIT) CAPS capsule Take 1 capsule (50,000 Units total) by mouth once a week. 12 capsule 0   No current facility-administered medications for this visit.   No results found.  Review of Systems:   A ROS was performed including pertinent positives and negatives as documented in the HPI.  Physical Exam :   Constitutional: NAD and appears stated age Neurological: Alert and oriented Psych: Appropriate affect and cooperative There were no vitals taken for this visit.   Comprehensive Musculoskeletal Exam:    Left hip with positive FADIR maneuver in the groin.  There is some gluteal insertion tenderness around the greater trochanter.  Internal rotation is to 20 degrees in the hip and 90 degrees of flexion again eliciting pain this is improved with external rotation at 45 degrees.  Remainder of distal neurosensory exam is intact with strong hip abduction strength   Imaging:   Xray  (3 views left hip): Ossification of the labrum right worse than left otherwise no evidence of femoral acetabular osteoarthritis  MRI (left hip): There is tearing of the anterior superior labrum with intact femoral acetabular cartilage.  Alpha angle measures 55 degrees   I personally reviewed and interpreted the radiographs.   Assessment and Plan:   43 y.o. 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  That being said I have  ultimately advised that I would recommend correction of his spondylolisthesis first as I do believe that this would ultimately give him the best possible outcome and recovery when it comes to his hip.  We did discuss associated recovery timeframe as well as the risks and limitations that come with hip arthroscopy.  I did ultimately recommend that I would recommend an ultrasound-guided injection of the left hip prior to his spinal surgery in order to allow him to have the best recovery from this.  Ultimately once he has had his back corrected I do believe he would be an excellent candidate for hip arthroscopy and labral repair  -Plan to follow-up for left hip ultrasound-guided injection prior to his surgery prior to his lumbar surgery  I personally saw and evaluated the patient, and participated in the management and treatment plan.  Andrew Harada, MD Attending Physician, Orthopedic Surgery  This document was dictated using Dragon voice recognition software. A reasonable attempt at proof reading has been made to minimize errors.

## 2023-09-17 ENCOUNTER — Other Ambulatory Visit (HOSPITAL_COMMUNITY): Payer: Self-pay

## 2023-09-18 ENCOUNTER — Other Ambulatory Visit (HOSPITAL_COMMUNITY): Payer: Self-pay

## 2023-09-18 MED ORDER — LISDEXAMFETAMINE DIMESYLATE 20 MG PO CAPS
20.0000 mg | ORAL_CAPSULE | Freq: Every day | ORAL | 0 refills | Status: DC
  Filled 2023-09-18: qty 30, 30d supply, fill #0

## 2023-09-20 ENCOUNTER — Other Ambulatory Visit: Payer: Self-pay

## 2023-09-20 ENCOUNTER — Other Ambulatory Visit (HOSPITAL_COMMUNITY): Payer: Self-pay

## 2023-09-20 DIAGNOSIS — M5416 Radiculopathy, lumbar region: Secondary | ICD-10-CM

## 2023-09-24 ENCOUNTER — Other Ambulatory Visit (HOSPITAL_COMMUNITY): Payer: Self-pay

## 2023-09-25 NOTE — Discharge Instructions (Signed)

## 2023-09-27 ENCOUNTER — Other Ambulatory Visit: Payer: Self-pay | Admitting: Family Medicine

## 2023-09-27 ENCOUNTER — Ambulatory Visit
Admission: RE | Admit: 2023-09-27 | Discharge: 2023-09-27 | Disposition: A | Discharge status: Home / Self Care | Source: Ambulatory Visit | Attending: Family Medicine | Admitting: Family Medicine

## 2023-09-27 DIAGNOSIS — M5416 Radiculopathy, lumbar region: Secondary | ICD-10-CM

## 2023-09-27 DIAGNOSIS — M4316 Spondylolisthesis, lumbar region: Secondary | ICD-10-CM | POA: Diagnosis not present

## 2023-09-27 DIAGNOSIS — M48061 Spinal stenosis, lumbar region without neurogenic claudication: Secondary | ICD-10-CM | POA: Diagnosis not present

## 2023-09-27 DIAGNOSIS — M4727 Other spondylosis with radiculopathy, lumbosacral region: Secondary | ICD-10-CM | POA: Diagnosis not present

## 2023-09-27 MED ORDER — IOPAMIDOL (ISOVUE-M 200) INJECTION 41%
1.0000 mL | Freq: Once | INTRAMUSCULAR | Status: AC
  Administered 2023-09-27: 1 mL via EPIDURAL

## 2023-09-27 MED ORDER — METHYLPREDNISOLONE ACETATE 40 MG/ML INJ SUSP (RADIOLOG
80.0000 mg | Freq: Once | INTRAMUSCULAR | Status: AC
  Administered 2023-09-27: 80 mg via EPIDURAL

## 2023-10-04 ENCOUNTER — Other Ambulatory Visit: Payer: Self-pay

## 2023-10-04 ENCOUNTER — Telehealth: Payer: Self-pay | Admitting: Neurosurgery

## 2023-10-04 ENCOUNTER — Telehealth: Payer: Self-pay

## 2023-10-04 DIAGNOSIS — Z01818 Encounter for other preprocedural examination: Secondary | ICD-10-CM

## 2023-10-04 DIAGNOSIS — G8929 Other chronic pain: Secondary | ICD-10-CM

## 2023-10-04 DIAGNOSIS — M4310 Spondylolisthesis, site unspecified: Secondary | ICD-10-CM

## 2023-10-04 DIAGNOSIS — Z981 Arthrodesis status: Secondary | ICD-10-CM

## 2023-10-04 NOTE — Telephone Encounter (Addendum)
 I spoke with Dr Alease Hunter. We discussed the information below. I have also sent this to him via mychart.   Planned surgery: Left L5-S1 minimally invasive (MIS) transforaminal lumbar interbody fusion (TLIF)   Surgery date: 12/06/23 at [facility] (Medical Mall: [address], [place], [place] [address]) - you will find out your arrival time the business day before your surgery.   Pre-op appointment at [facility] Pre-admit Testing: you will receive a call with a date/time for this appointment. If you are scheduled for an in person appointment, Pre-admit Testing is located on the first floor of the [facility] building, [address] [facility] [address] [facility] [address], [address]. During this appointment, they will advise you which medications you can take the morning of surgery, and which medications you will need to hold for surgery. Labs (such as blood work, EKG) may be done at your pre-op appointment. You are not required to fast for these labs. Should you need to change your pre-op appointment, please call Pre-admit testing at [phone].   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 I will contact Renate Caroline, NP in Pre-admit testing to coordinate orders for labs, PCR nasal swab, and EKG. He would like to have these done at his office in [place] to avoid a trip to [place], if possible.   Per anesthesia guidelines (due to the increased risk of aspiration caused by delayed gastric emptying): Zepbound : hold for 7 days prior to surgery, can resume after surgery    NSAIDS (Non-steroidal anti-inflammatory drugs): because you are having a fusion, please avoid taking any NSAIDS (examples: ibuprofen, motrin, aleve, naproxen, meloxicam, diclofenac) for 3 months after surgery. Celebrex is an exception and is OK to take, if prescribed. Tylenol is not an NSAID.    Common restrictions after surgery: No bending, lifting, or twisting ("BLT"). Avoid lifting objects heavier than 10 pounds for the first  6 weeks after surgery. Where possible, avoid household activities that involve lifting, bending, reaching, pushing, or pulling such as laundry, vacuuming, grocery shopping, and childcare. Try to arrange for help from friends and family for these activities while you heal. Do not drive while taking prescription pain medication. Weeks 6 through 12 after surgery: avoid lifting more than 25 pounds.    X-rays after surgery:*As we discussed, you can also have these done at your office prior to your appointment*: Because you are having a fusion: for appointments after your 2 week follow-up: please arrive at the [facility] outpatient imaging center ([address], [address], [employer]) or [facility] one hour prior to your appointment for x-rays. This applies to every appointment after your 2 week follow-up.    How to contact us :  If you have any questions/concerns before or after surgery, you can reach us  at [phone], or you can send a mychart message. We can be reached by phone or mychart 8am-4pm, Monday-Friday.  *Please note: Calls after 4pm are forwarded to a third party answering service. Mychart messages are not routinely monitored during evenings, weekends, and holidays. Please call our office to contact the answering service for urgent concerns during non-business hours.    If you have FMLA/disability paperwork, please drop it off or fax it to [phone], attention Patty.   Appointments/FMLA & disability paperwork: Gerlean Kocher, & Maryann Smalls Registered Nurses/Surgery schedulers: Korinna Tat & Lauren Medical Assistants: Donnajean Fuse Physician Assistants: Ludwig Safer, PA-C, Anastacio Karvonen, PA-C & Lucetta Russel, PA-C Surgeons: Jodeen Munch, MD & Henderson Lock, MD   [facility] PREADMIT TESTING VISIT and SURGERY INFORMATION SHEET   Now that surgery has been scheduled you can anticipate several phone calls from [facility] services. A pharmacy  technician will call you to verify your current list of medications taken at home.               The Pre-Service Center will call to verify your insurance information and to give you billing estimates and information.             The Preadmit Testing Office will be calling to schedule a visit to obtain information for the anesthesia team and provide instructions on preparation for surgery.  What can you expect for the Preadmit Testing Visit: Appointments may be scheduled in-person or by telephone.  If a telephone visit is scheduled, you may be asked to come into the office to have lab tests or other studies performed.   This visit will not be completed any greater than 14 days prior to your surgery.  If your surgery has been scheduled for a future date, please do not be alarmed if we have not contacted you to schedule an appointment more than a month prior to the surgery date.    Please be prepared to provide the following information during this appointment:            -Personal medical history                                               -Medication and allergy list            -Any history of problems with anesthesia              -Recent lab work or diagnostic studies            -Please notify us  of any needs we should be aware of to provide the best care possible           -You will be provided with instructions on how to prepare for your surgery.    On The Day of Surgery:  You must have a driver to take you home after surgery, you will be asked not to drive for 24 hours following surgery.  Taxi, [employer] and non-medical transport will not be acceptable means of transportation unless you have a responsible individual who will be traveling with you.  Visitors in the surgical area:   2 people will be able to visit you in your room once your preparation for surgery has been completed. During surgery, your visitors will be asked to wait in the Surgery Waiting Area.  It is not a requirement for them  to stay, if they prefer to leave and come back.  Your visitor(s) will be given an update once the surgery has been completed.  No visitors are allowed in the initial recovery room to respect patient privacy and safety.  Once you are more awake and transfer to the secondary recovery area, or are transferred to an inpatient room, visitors will again be able to see you.  To respect and protect your privacy: We will ask on the day of surgery who your driver will be and what the contact number for that individual will be. We will ask if it is okay to share information with this individual, or if there is an alternative individual that we, or  the surgeon, should contact to provide updates and information. If family or friends come to the surgical information desk requesting information about you, who you have not listed with us , no information will be given.   It may be helpful to designate someone as the main contact who will be responsible for updating your other friends and family.    PREADMIT TESTING OFFICE: [phone] SAME DAY SURGERY: [phone] We look forward to caring for you before and throughout the process of your surgery.

## 2023-10-04 NOTE — Telephone Encounter (Signed)
 Patient called wondering if we had an update on his surgery date- he just wants to update his clinic schedule

## 2023-10-04 NOTE — Telephone Encounter (Signed)
 We received the call schedule yesterday from Dr. Mont Antis. Will discuss with Kendelyn when she comes in as I am not 100% up to date on his situation.

## 2023-10-07 ENCOUNTER — Other Ambulatory Visit: Payer: Self-pay | Admitting: Family Medicine

## 2023-10-08 ENCOUNTER — Other Ambulatory Visit (HOSPITAL_COMMUNITY): Payer: Self-pay

## 2023-10-08 ENCOUNTER — Other Ambulatory Visit: Payer: Self-pay

## 2023-10-08 MED ORDER — GABAPENTIN 300 MG PO CAPS
300.0000 mg | ORAL_CAPSULE | Freq: Every day | ORAL | 0 refills | Status: AC
  Filled 2023-10-08: qty 90, 90d supply, fill #0

## 2023-10-08 NOTE — Telephone Encounter (Signed)
 Noted

## 2023-10-18 DIAGNOSIS — I10 Essential (primary) hypertension: Secondary | ICD-10-CM | POA: Diagnosis not present

## 2023-10-18 DIAGNOSIS — R632 Polyphagia: Secondary | ICD-10-CM | POA: Diagnosis not present

## 2023-10-18 DIAGNOSIS — E66811 Obesity, class 1: Secondary | ICD-10-CM | POA: Diagnosis not present

## 2023-10-18 DIAGNOSIS — Z6833 Body mass index (BMI) 33.0-33.9, adult: Secondary | ICD-10-CM | POA: Diagnosis not present

## 2023-10-18 DIAGNOSIS — K219 Gastro-esophageal reflux disease without esophagitis: Secondary | ICD-10-CM | POA: Diagnosis not present

## 2023-10-18 DIAGNOSIS — E782 Mixed hyperlipidemia: Secondary | ICD-10-CM | POA: Diagnosis not present

## 2023-10-24 ENCOUNTER — Other Ambulatory Visit (HOSPITAL_COMMUNITY): Payer: Self-pay

## 2023-10-26 ENCOUNTER — Other Ambulatory Visit (HOSPITAL_COMMUNITY): Payer: Self-pay

## 2023-10-26 MED ORDER — LISDEXAMFETAMINE DIMESYLATE 20 MG PO CAPS
20.0000 mg | ORAL_CAPSULE | Freq: Every day | ORAL | 0 refills | Status: DC
  Filled 2023-10-26: qty 30, 30d supply, fill #0

## 2023-10-30 ENCOUNTER — Other Ambulatory Visit: Payer: Self-pay

## 2023-11-16 ENCOUNTER — Other Ambulatory Visit: Payer: Self-pay | Admitting: Internal Medicine

## 2023-11-16 ENCOUNTER — Other Ambulatory Visit (HOSPITAL_COMMUNITY): Payer: Self-pay

## 2023-11-16 DIAGNOSIS — M1A0790 Idiopathic chronic gout, unspecified ankle and foot, without tophus (tophi): Secondary | ICD-10-CM

## 2023-11-17 ENCOUNTER — Other Ambulatory Visit (HOSPITAL_COMMUNITY): Payer: Self-pay

## 2023-11-17 ENCOUNTER — Other Ambulatory Visit: Payer: Self-pay | Admitting: Internal Medicine

## 2023-11-18 ENCOUNTER — Other Ambulatory Visit (HOSPITAL_COMMUNITY): Payer: Self-pay

## 2023-11-18 ENCOUNTER — Other Ambulatory Visit: Payer: Self-pay

## 2023-11-18 MED ORDER — PANTOPRAZOLE SODIUM 40 MG PO TBEC
DELAYED_RELEASE_TABLET | Freq: Every day | ORAL | 3 refills | Status: AC
  Filled 2023-11-18: qty 90, 90d supply, fill #0
  Filled 2024-02-22: qty 90, 90d supply, fill #1
  Filled 2024-05-20: qty 90, 90d supply, fill #2

## 2023-11-18 MED ORDER — FEBUXOSTAT 80 MG PO TABS
1.0000 | ORAL_TABLET | Freq: Every day | ORAL | 3 refills | Status: AC
  Filled 2023-11-18: qty 90, 90d supply, fill #0
  Filled 2024-02-15: qty 90, 90d supply, fill #1
  Filled 2024-05-16: qty 90, 90d supply, fill #2

## 2023-11-18 MED ORDER — FLUOXETINE HCL 20 MG PO CAPS
20.0000 mg | ORAL_CAPSULE | Freq: Every day | ORAL | 5 refills | Status: DC
  Filled 2023-11-18 – 2023-11-24 (×2): qty 30, 30d supply, fill #0
  Filled 2023-12-24: qty 30, 30d supply, fill #1
  Filled 2024-01-25: qty 30, 30d supply, fill #2
  Filled 2024-02-22: qty 30, 30d supply, fill #3

## 2023-11-18 MED ORDER — VITAMIN D (ERGOCALCIFEROL) 1.25 MG (50000 UNIT) PO CAPS
50000.0000 [IU] | ORAL_CAPSULE | ORAL | 1 refills | Status: AC
  Filled 2023-11-18: qty 12, 84d supply, fill #0
  Filled 2024-02-15: qty 12, 84d supply, fill #1

## 2023-11-22 ENCOUNTER — Other Ambulatory Visit (HOSPITAL_COMMUNITY): Payer: Self-pay

## 2023-11-22 ENCOUNTER — Ambulatory Visit: Payer: Self-pay | Admitting: Urgent Care

## 2023-11-22 ENCOUNTER — Other Ambulatory Visit: Payer: Self-pay

## 2023-11-22 ENCOUNTER — Encounter
Admission: RE | Admit: 2023-11-22 | Discharge: 2023-11-22 | Disposition: A | Discharge status: Home / Self Care | Source: Ambulatory Visit | Attending: Neurosurgery | Admitting: Neurosurgery

## 2023-11-22 VITALS — BP 134/84 | HR 62 | Resp 16 | Ht 72.0 in | Wt 247.8 lb

## 2023-11-22 DIAGNOSIS — Z79899 Other long term (current) drug therapy: Secondary | ICD-10-CM | POA: Diagnosis not present

## 2023-11-22 DIAGNOSIS — T502X5A Adverse effect of carbonic-anhydrase inhibitors, benzothiadiazides and other diuretics, initial encounter: Secondary | ICD-10-CM | POA: Diagnosis not present

## 2023-11-22 DIAGNOSIS — E669 Obesity, unspecified: Secondary | ICD-10-CM | POA: Diagnosis not present

## 2023-11-22 DIAGNOSIS — Z01812 Encounter for preprocedural laboratory examination: Secondary | ICD-10-CM

## 2023-11-22 DIAGNOSIS — Z01810 Encounter for preprocedural cardiovascular examination: Secondary | ICD-10-CM | POA: Diagnosis not present

## 2023-11-22 DIAGNOSIS — E876 Hypokalemia: Secondary | ICD-10-CM

## 2023-11-22 DIAGNOSIS — I10 Essential (primary) hypertension: Secondary | ICD-10-CM | POA: Diagnosis not present

## 2023-11-22 DIAGNOSIS — G4733 Obstructive sleep apnea (adult) (pediatric): Secondary | ICD-10-CM

## 2023-11-22 DIAGNOSIS — Z01818 Encounter for other preprocedural examination: Secondary | ICD-10-CM | POA: Diagnosis present

## 2023-11-22 DIAGNOSIS — Z6830 Body mass index (BMI) 30.0-30.9, adult: Secondary | ICD-10-CM | POA: Insufficient documentation

## 2023-11-22 DIAGNOSIS — M4310 Spondylolisthesis, site unspecified: Secondary | ICD-10-CM

## 2023-11-22 HISTORY — DX: Unspecified osteoarthritis, unspecified site: M19.90

## 2023-11-22 HISTORY — DX: Anxiety disorder, unspecified: F41.9

## 2023-11-22 HISTORY — DX: Other complications of anesthesia, initial encounter: T88.59XA

## 2023-11-22 HISTORY — DX: Other specified postprocedural states: Z98.890

## 2023-11-22 HISTORY — DX: Male erectile dysfunction, unspecified: N52.9

## 2023-11-22 HISTORY — DX: Spondylolisthesis, site unspecified: M43.10

## 2023-11-22 HISTORY — DX: Other specified postprocedural states: R11.2

## 2023-11-22 LAB — BASIC METABOLIC PANEL WITH GFR
Anion gap: 11 (ref 5–15)
BUN: 13 mg/dL (ref 6–20)
CO2: 27 mmol/L (ref 22–32)
Calcium: 9.5 mg/dL (ref 8.9–10.3)
Chloride: 101 mmol/L (ref 98–111)
Creatinine, Ser: 1.01 mg/dL (ref 0.61–1.24)
GFR, Estimated: >60 mL/min (ref >60)
Glucose, Bld: 101 mg/dL — ABNORMAL HIGH (ref 70–99)
Potassium: 3 mmol/L — ABNORMAL LOW (ref 3.5–5.1)
Sodium: 139 mmol/L (ref 135–145)

## 2023-11-22 LAB — URINALYSIS, COMPLETE (UACMP) WITH MICROSCOPIC
Bacteria, UA: NONE SEEN
Bilirubin Urine: NEGATIVE
Glucose, UA: NEGATIVE mg/dL
Hgb urine dipstick: NEGATIVE
Ketones, ur: NEGATIVE mg/dL
Leukocytes,Ua: NEGATIVE
Nitrite: NEGATIVE
Protein, ur: NEGATIVE mg/dL
Specific Gravity, Urine: 1.006 (ref 1.005–1.030)
Squamous Epithelial / HPF: 0 /HPF (ref 0–5)
pH: 7 (ref 5.0–8.0)

## 2023-11-22 LAB — CBC
HCT: 38.1 % — ABNORMAL LOW (ref 39.0–52.0)
Hemoglobin: 14.1 g/dL (ref 13.0–17.0)
MCH: 30.3 pg (ref 26.0–34.0)
MCHC: 37 g/dL — ABNORMAL HIGH (ref 30.0–36.0)
MCV: 81.9 fL (ref 80.0–100.0)
Platelets: 216 K/uL (ref 150–400)
RBC: 4.65 MIL/uL (ref 4.22–5.81)
RDW: 12.5 % (ref 11.5–15.5)
WBC: 5.6 K/uL (ref 4.0–10.5)
nRBC: 0 % (ref 0.0–0.2)

## 2023-11-22 LAB — TYPE AND SCREEN
ABO/RH(D): A POS
Antibody Screen: NEGATIVE

## 2023-11-22 LAB — SURGICAL PCR SCREEN
MRSA, PCR: NEGATIVE
Staphylococcus aureus: NEGATIVE

## 2023-11-22 MED ORDER — POTASSIUM CHLORIDE ER 10 MEQ PO TBCR
EXTENDED_RELEASE_TABLET | ORAL | 0 refills | Status: DC
  Filled 2023-11-22 (×2): qty 14, 14d supply, fill #0

## 2023-11-22 NOTE — Patient Instructions (Addendum)
 Your procedure is scheduled on: 12/06/23 - Friday Report to the Registration Desk on the 1st floor of the Medical Mall. To find out your arrival time, please call [phone] between 1PM - 3PM on: 12/05/23 - Thursday If your arrival time is 6:00 am, do not arrive before that time as the Medical Mall entrance doors do not open until 6:00 am.  REMEMBER: Instructions that are not followed completely may result in serious medical risk, up to and including death; or upon the discretion of your surgeon and anesthesiologist your surgery may need to be rescheduled.  Do not eat food after midnight the night before surgery.  No gum chewing or hard candies.  You may however, drink CLEAR liquids up to 2 hours before you are scheduled to arrive for your surgery. Do not drink anything within 2 hours of your scheduled arrival time.  Clear liquids include: - water  - apple juice without pulp - gatorade (not RED colors) - black coffee or tea (Do NOT add milk or creamers to the coffee or tea) Do NOT drink anything that is not on this list.   NSAIDS (Non-steroidal anti-inflammatory drugs): because you are having a fusion, please avoid taking any NSAIDS (examples: ibuprofen, motrin, aleve, naproxen, meloxicam, diclofenac) for 3 months after surgery. Celebrex is an exception and is OK to take, if prescribed. Tylenol is not an NSAID. You may continue to take Tylenol if needed for pain up until the day of surgery.  Stop ANY OVER THE COUNTER supplements until after surgery.  Zepbound : hold for 7 days prior to surgery, can resume after surgery   ON THE DAY OF SURGERY ONLY TAKE THESE MEDICATIONS WITH SIPS OF WATER:  amLODipine  (NORVASC )  Febuxostat   FLUoxetine  (PROZAC )  pantoprazole  (PROTONIX )    No Alcohol for 24 hours before or after surgery.  No Smoking including e-cigarettes for 24 hours before surgery.  No chewable tobacco products for at least 6 hours before surgery.  No nicotine patches on the  day of surgery.  Do not use any recreational drugs for at least a week (preferably 2 weeks) before your surgery.  Please be advised that the combination of cocaine and anesthesia may have negative outcomes, up to and including death. If you test positive for cocaine, your surgery will be cancelled.  On the morning of surgery brush your teeth with toothpaste and water, you may rinse your mouth with mouthwash if you wish. Do not swallow any toothpaste or mouthwash.  Use CHG Soap or wipes as directed on instruction sheet.  Do not wear jewelry, make-up, hairpins, clips or nail polish.  For welded (permanent) jewelry: bracelets, anklets, waist bands, etc.  Please have this removed prior to surgery.  If it is not removed, there is a chance that hospital personnel will need to cut it off on the day of surgery.  Do not wear lotions, powders, or perfumes.   Do not shave body hair from the neck down 48 hours before surgery.  Contact lenses, hearing aids and dentures may not be worn into surgery.  Do not bring valuables to the hospital. [facility] is not responsible for any missing/lost belongings or valuables.   Bring your C-PAP to the hospital in case you may have to spend the night.   Notify your doctor if there is any change in your medical condition (cold, fever, infection).  Wear comfortable clothing (specific to your surgery type) to the hospital.  After surgery, you can help prevent lung complications by  doing breathing exercises.  Take deep breaths and cough every 1-2 hours. Your doctor may order a device called an Incentive Spirometer to help you take deep breaths.  When coughing or sneezing, hold a pillow firmly against your incision with both hands. This is called "splinting." Doing this helps protect your incision. It also decreases belly discomfort.  If you are being admitted to the hospital overnight, leave your suitcase in the car. After surgery it may be brought to your  room.  In case of increased patient census, it may be necessary for you, the patient, to continue your postoperative care in the Same Day Surgery department.  If you are being discharged the day of surgery, you will not be allowed to drive home. You will need a responsible individual to drive you home and stay with you for 24 hours after surgery.   If you are taking public transportation, you will need to have a responsible individual with you.  Please call the Pre-admissions Testing Dept. at [phone] if you have any questions about these instructions.  Surgery Visitation Policy:  Patients having surgery or a procedure may have two visitors.  Children under the age of 2 must have an adult with them who is not the patient.  Inpatient Visitation:    Visiting hours are 7 a.m. to 8 p.m. Up to four visitors are allowed at one time in a patient room. The visitors may rotate out with other people during the day.  One visitor age 12 or older may stay with the patient overnight and must be in the room by 8 p.m.   Merchandiser, retail to address health-related social needs:  [email or web address]    Pre-operative 5 CHG Bath Instructions   You can play a key role in reducing the risk of infection after surgery. Your skin needs to be as free of germs as possible. You can reduce the number of germs on your skin by washing with CHG (chlorhexidine gluconate) soap before surgery. CHG is an antiseptic soap that kills germs and continues to kill germs even after washing.   DO NOT use if you have an allergy to chlorhexidine/CHG or antibacterial soaps. If your skin becomes reddened or irritated, stop using the CHG and notify one of our RNs at [phone].   Please shower with the CHG soap starting 4 days before surgery using the following schedule: 07/28 - 08/01.    Please keep in mind the following:  DO NOT shave, including legs and underarms, starting the day of your  first shower.   You may shave your face at any point before/day of surgery.  Place clean sheets on your bed the day you start using CHG soap. Use a clean washcloth (not used since being washed) for each shower. DO NOT sleep with pets once you start using the CHG.   CHG Shower Instructions:  If you choose to wash your hair and private area, wash first with your normal shampoo/soap.  After you use shampoo/soap, rinse your hair and body thoroughly to remove shampoo/soap residue.  Turn the water OFF and apply about 3 tablespoons (45 ml) of CHG soap to a CLEAN washcloth.  Apply CHG soap ONLY FROM YOUR NECK DOWN TO YOUR TOES (washing for 3-5 minutes)  DO NOT use CHG soap on face, private areas, open wounds, or sores.  Pay special attention to the area where your surgery is being performed.  If you are having back surgery, having someone wash your back for you  may be helpful. Wait 2 minutes after CHG soap is applied, then you may rinse off the CHG soap.  Pat dry with a clean towel  Put on clean clothes/pajamas   If you choose to wear lotion, please use ONLY the CHG-compatible lotions on the back of this paper.     Additional instructions for the day of surgery: DO NOT APPLY any lotions, deodorants, cologne, or perfumes.   Put on clean/comfortable clothes.  Brush your teeth.  Ask your nurse before applying any prescription medications to the skin.      CHG Compatible Lotions   Aveeno Moisturizing lotion  Cetaphil Moisturizing Cream  Cetaphil Moisturizing Lotion  Clairol Herbal Essence Moisturizing Lotion, Dry Skin  Clairol Herbal Essence Moisturizing Lotion, Extra Dry Skin  Clairol Herbal Essence Moisturizing Lotion, Normal Skin  Curel Age Defying Therapeutic Moisturizing Lotion with Alpha Hydroxy  Curel Extreme Care Body Lotion  Curel Soothing Hands Moisturizing Hand Lotion  Curel Therapeutic Moisturizing Cream, Fragrance-Free  Curel Therapeutic Moisturizing Lotion, Fragrance-Free   Curel Therapeutic Moisturizing Lotion, Original Formula  Eucerin Daily Replenishing Lotion  Eucerin Dry Skin Therapy Plus Alpha Hydroxy Crme  Eucerin Dry Skin Therapy Plus Alpha Hydroxy Lotion  Eucerin Original Crme  Eucerin Original Lotion  Eucerin Plus Crme Eucerin Plus Lotion  Eucerin TriLipid Replenishing Lotion  Keri Anti-Bacterial Hand Lotion  Keri Deep Conditioning Original Lotion Dry Skin Formula Softly Scented  Keri Deep Conditioning Original Lotion, Fragrance Free Sensitive Skin Formula  Keri Lotion Fast Absorbing Fragrance Free Sensitive Skin Formula  Keri Lotion Fast Absorbing Softly Scented Dry Skin Formula  Keri Original Lotion  Keri Skin Renewal Lotion Keri Silky Smooth Lotion  Keri Silky Smooth Sensitive Skin Lotion  Nivea Body Creamy Conditioning Oil  Nivea Body Extra Enriched Teacher, adult education Moisturizing Lotion Nivea Crme  Nivea Skin Firming Lotion  NutraDerm 30 Skin Lotion  NutraDerm Skin Lotion  NutraDerm Therapeutic Skin Cream  NutraDerm Therapeutic Skin Lotion  ProShield Protective Hand Cream  Provon moisturizing lotion

## 2023-11-22 NOTE — Progress Notes (Signed)
 [facility] Perioperative Services: Pre-Admission/Anesthesia Testing  Abnormal Lab Notification and Treatment Plan of Care   Date: 11/22/23  Name: Andrew GORMAN Lloyd, MD DOB: 09-Dec-1980 MRN:   [id number]  Re: Abnormal labs noted during PAT appointment   Notified:  Provider Name Provider Role Notification Mode  Clois Fret, MD Neurosurgery (Surgeon) Routed and/or faxed via RANELL Hope, Glade PARAS, MD Primary Care Provider Routed and/or faxed via [facility]   Clinical Information and Notes:  ABNORMAL LAB VALUE(S): Lab Results  Component Value Date   K 3.0 (L) 11/22/2023   Andrew GORMAN Lloyd, 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 In review of his medication reconciliation, it is noted that the patient is taking prescribed diuretic medications (HCTZ 25 mg ) daily.   Please note, in efforts to promote a safe and effective anesthetic course, per current guidelines/standards set by the [facility] anesthesia team, the minimal acceptable K+ level for the patient to proceed with general anesthesia is 3.0 mmol/L. With that being said, if the patient drops any lower, his elective procedure will need to be postponed until K+ is better optimized. In efforts to prevent case cancellation, and ultimately to promote the safety of this patient undergoing sedation/anesthesia, will make efforts to optimize pre-surgical K+ level allowing the surgical intervention to proceed as planned.    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   He is on daily thiazide diuretic therapy. Discussed diuretic therapy as likely etiology in the setting of normal Mg level and in the absence of GI related symptoms (no diarrhea). Patient denies regular use of laxative medications. Reviewed other potential causes, including decreased intake of dietary K+ and warmer weather resulting in  increased insensible losses. Reviewed plans for short term preoperative optimization as follows:   Meds ordered this encounter  Medications   potassium chloride (KLOR-CON) 10 MEQ tablet    Sig: Take 1 tablet (10 mEq) daily until surgery. Be sure to take dose on day of surgery. Follow up with PCP for repeat labs.    Dispense:  14 tablet    Refill:  0    Please contact the patient as soon as it is available for pickup. Rx is for preoperative K+ optimization and needs to be started ASAP.   Encouraged patient to follow up with PCP about 2-3 weeks postoperatively to have labs rechecked to ensure that levels are remaining within normal range. Discussed nutritional intake of K+ rich foods as an adjunctive way to keep his K+ levels normal; list of K+ rich foods provided. Also mentioned ORS, however advised him not to rely solely on these drinks, as they are high in Na+, and he has a HTN diagnosis.   Will send copy of this note to surgeon and PCP to make them aware of K+ level and plans for correction. Discussed that PCP may elect to pursue a change in diuretic therapy to a K+ sparing type medication, or alternatively, they may consider adding a daily K+ supplement if levels remain low on recheck. Order entered to recheck K+ on the day of his surgery to ensure optimization. Wished patient the best of luck with his upcoming surgery and subsequent recovery. He was encouraged to return call to the PAT clinic, or to his surgeon's office, should any questions or concerns arise between now and the time of his surgery. Patient was appreciative  of the care/concern expressed by PAT staff.   Encounter Diagnoses  Name Primary?   Pre-operative laboratory examination Yes   Diuretic-induced hypokalemia    Long term current use of diuretic    Dae Antonucci, MSN, APRN, FNP-C, CEN [facility]  Perioperative Services Nurse Practitioner Phone: [phone] 11/22/23 4:05 PM  NOTE: This note has been  prepared using Dragon dictation software. Despite my best ability to proofread, there is always the potential that unintentional transcriptional errors may still occur from this process.

## 2023-11-25 ENCOUNTER — Other Ambulatory Visit (HOSPITAL_COMMUNITY): Payer: Self-pay

## 2023-11-25 ENCOUNTER — Encounter: Payer: Self-pay | Admitting: Internal Medicine

## 2023-11-25 ENCOUNTER — Other Ambulatory Visit: Payer: Self-pay

## 2023-11-25 DIAGNOSIS — I10 Essential (primary) hypertension: Secondary | ICD-10-CM

## 2023-11-25 MED ORDER — LISINOPRIL 10 MG PO TABS
10.0000 mg | ORAL_TABLET | Freq: Every day | ORAL | 3 refills | Status: AC
  Filled 2023-11-25: qty 90, 90d supply, fill #0
  Filled 2024-02-22: qty 90, 90d supply, fill #1
  Filled 2024-05-20: qty 90, 90d supply, fill #2

## 2023-11-26 ENCOUNTER — Other Ambulatory Visit (HOSPITAL_COMMUNITY): Payer: Self-pay

## 2023-11-26 ENCOUNTER — Other Ambulatory Visit: Payer: Self-pay

## 2023-11-26 DIAGNOSIS — Z79899 Other long term (current) drug therapy: Secondary | ICD-10-CM | POA: Diagnosis not present

## 2023-11-26 DIAGNOSIS — Z6832 Body mass index (BMI) 32.0-32.9, adult: Secondary | ICD-10-CM | POA: Diagnosis not present

## 2023-11-26 DIAGNOSIS — E782 Mixed hyperlipidemia: Secondary | ICD-10-CM | POA: Diagnosis not present

## 2023-11-26 DIAGNOSIS — R632 Polyphagia: Secondary | ICD-10-CM | POA: Diagnosis not present

## 2023-11-26 DIAGNOSIS — I10 Essential (primary) hypertension: Secondary | ICD-10-CM | POA: Diagnosis not present

## 2023-11-26 DIAGNOSIS — E66811 Obesity, class 1: Secondary | ICD-10-CM | POA: Diagnosis not present

## 2023-11-27 ENCOUNTER — Ambulatory Visit (INDEPENDENT_AMBULATORY_CARE_PROVIDER_SITE_OTHER): Admitting: Orthopaedic Surgery

## 2023-11-27 DIAGNOSIS — S73192A Other sprain of left hip, initial encounter: Secondary | ICD-10-CM

## 2023-11-27 NOTE — Progress Notes (Signed)
 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  He has been quite active and enjoys going to the gym with his children.  He has been endorsing and noticing decreased range of motion about the hip.  He does have a known history of a spondylolisthesis of the lower spine for which she is seeing Dr. Katrina.  He has not had previous injections in the hip but he has been working in physical therapy to strengthen his gluteal muscles.  He is also experiencing some pain laterally about the gluteal insertion as well.  Overall this has provided relief in terms of physical therapy but he is still having persistent groin based symptoms that do periodically flareup particularly in conjunction with loss of motion.   PMH/PSH/Family History/Social History/Meds/Allergies:    Past Medical History:  Diagnosis Date   ADHD    Anxiety    Arthritis    Back pain    Complication of anesthesia    a.) PONV   Erectile dysfunction    a.) on PDE5i (tadalafil ) PRN   Fatty liver    GERD (gastroesophageal reflux disease)    Gout    Hypertension    Lactose intolerance    OSA (obstructive sleep apnea)    Pars defect with spondylolisthesis    PONV (postoperative nausea and vomiting)    Seasonal allergies    Past Surgical History:  Procedure Laterality Date   bone marrow donation     COLONOSCOPY     ELBOW SURGERY Left    KNEE SURGERY     VASECTOMY     WISDOM TOOTH EXTRACTION     Social History   Socioeconomic History   Marital status: Married    Spouse name: NEHAL, SHIVES (Spouse) [phone] (Mobile)   Number of children: 2   Years of education: Not on file   Highest education level: Doctorate  Occupational History   Occupation:  Doctor  Tobacco Use   Smoking status: Never    Passive exposure: Never   Smokeless tobacco: Never  Vaping Use   Vaping status: Never Used  Substance and Sexual Activity   Alcohol use: Yes    Comment: rarely   Drug use: No   Sexual activity: Yes    Partners: Female  Other Topics Concern   Not on file  Social History Narrative   Not on file   Social Drivers of Health   Financial Resource Strain: Low Risk  (05/29/2023)   Overall Financial Resource Strain (CARDIA)    Difficulty of Paying Living Expenses: Not hard at all  Food Insecurity: No Food Insecurity (05/29/2023)   Hunger Vital Sign    Worried About Running Out of Food in the Last Year: Never true    Ran Out of Food in the Last Year: Never true  Transportation Needs: No Transportation Needs (05/29/2023)   PRAPARE - Administrator, Civil Service (Medical): No    Lack of Transportation (Non-Medical): No  Physical Activity: Insufficiently Active (05/29/2023)   Exercise Vital Sign    Days of Exercise per Week: 3 days    Minutes of Exercise per Session: 20 min  Stress: Stress Concern Present (05/29/2023)   [employer]  of Occupational Health - Occupational Stress Questionnaire    Feeling of Stress : Rather much  Social Connections: Socially Isolated (05/29/2023)   Social Connection and Isolation Panel    Frequency of Communication with Friends and Family: Never    Frequency of Social Gatherings with Friends and Family: Never    Attends Religious Services: Never    Database administrator or Organizations: No    Attends Engineer, structural: Not on file    Marital Status: Married   Family History  Problem Relation Age of Onset   Anxiety disorder Mother    Colon cancer Neg Hx    Liver disease Neg Hx    Esophageal cancer Neg Hx    Allergies  Allergen Reactions   Sulfa Antibiotics Nausea And Vomiting   Current Outpatient Medications  Medication Sig Dispense Refill   acetaminophen (TYLENOL) 650 MG  CR tablet Take 1,300 mg by mouth every 8 (eight) hours as needed for pain.     betamethasone  valerate (VALISONE ) 0.1 % cream Apply 1 Application topically daily. (Patient taking differently: Apply 1 Application topically daily as needed (eczma).) 45 g 0   cetirizine (ZYRTEC) 10 MG tablet Take 10 mg by mouth daily.     desonide  (DESOWEN ) 0.05 % ointment Apply 1 Application topically to eyelids  (two) times daily for 7 days. (Patient taking differently: Apply 1 Application topically 2 (two) times daily as needed (eczmea around eyelids).) 30 g 0   Febuxostat  80 MG TABS Take 1 tablet (80 mg total) by mouth daily. 90 tablet 3   FLUoxetine  (PROZAC ) 20 MG capsule Take 1 capsule (20 mg total) by mouth daily. 30 capsule 5   gabapentin  (NEURONTIN ) 300 MG capsule Take 1 capsule (300 mg total) by mouth at bedtime. (Patient taking differently: Take 300 mg by mouth 3 (three) times daily as needed (pain).) 90 capsule 0   hydrochlorothiazide  (HYDRODIURIL ) 25 MG tablet Take 1 tablet (25 mg total) by mouth daily. 90 tablet 3   lisdexamfetamine (VYVANSE ) 20 MG capsule Take 1 capsule (20 mg total) by mouth daily. (Patient taking differently: Take 20 mg by mouth daily as needed (when working).) 30 capsule 0   lisinopril  (ZESTRIL ) 10 MG tablet Take 1 tablet (10 mg total) by mouth daily. 90 tablet 3   naproxen sodium (ALEVE) 220 MG tablet Take 440 mg by mouth 2 (two) times daily as needed (pain).     pantoprazole  (PROTONIX ) 40 MG tablet Take 1 tablet by mouth daily. 90 tablet 3   potassium chloride  (KLOR-CON ) 10 MEQ tablet Take 1 tablet (10 mEq) daily until surgery. Be sure to take dose on day of surgery. Follow up with PCP for repeat labs. 14 tablet 0   rosuvastatin  (CRESTOR ) 5 MG tablet Take 1 tablet (5 mg total) by mouth daily. 90 tablet 3   tirzepatide  (ZEPBOUND ) 5 MG/0.5ML Pen Inject 5 mg into the skin every Thursday.     traZODone  (DESYREL ) 50 MG tablet Take 1 to 2 tablets by mouth at bedtime. (Patient taking  differently: Take 50 mg by mouth at bedtime as needed for sleep.) 60 tablet 3   Vitamin D , Ergocalciferol , (DRISDOL ) 1.25 MG (50000 UNIT) CAPS capsule Take 1 capsule (50,000 Units total) by mouth once a week. 12 capsule 1   No current facility-administered medications for this visit.   No results found.  Review of Systems:   A ROS was performed including pertinent positives and negatives as documented in the HPI.  Physical Exam :  Constitutional: NAD and appears stated age Neurological: Alert and oriented Psych: Appropriate affect and cooperative There were no vitals taken for this visit.   Comprehensive Musculoskeletal Exam:    Left hip with positive FADIR maneuver in the groin.  There is some gluteal insertion tenderness around the greater trochanter.  Internal rotation is to 20 degrees in the hip and 90 degrees of flexion again eliciting pain this is improved with external rotation at 45 degrees.  Remainder of distal neurosensory exam is intact with strong hip abduction strength   Imaging:   Xray (3 views left hip): Ossification of the labrum right worse than left otherwise no evidence of femoral acetabular osteoarthritis  MRI (left hip): There is tearing of the anterior superior labrum with intact femoral acetabular cartilage.  Alpha angle measures 55 degrees   I personally reviewed and interpreted the radiographs.   Assessment and Plan:   43 y.o. 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  That being said I have ultimately advised that I would recommend correction of his spondylolisthesis first as I do believe that this would ultimately give him the best possible outcome and recovery when it comes to his hip.  We did discuss associated recovery timeframe as well as the risks and limitations that come with hip arthroscopy.  I did ultimately recommend that I would recommend an  ultrasound-guided injection of the left hip prior to his spinal surgery in order to allow him to have the best recovery from this.  Ultimately once he has had his back corrected I do believe he would be an excellent candidate for hip arthroscopy and labral repair  - Left hip ultrasound-guided injection provided today  I personally saw and evaluated the patient, and participated in the management and treatment plan.  Elspeth Parker, MD Attending Physician, Orthopedic Surgery  This document was dictated using Dragon voice recognition software. A reasonable attempt at proof reading has been made to minimize errors.

## 2023-11-28 ENCOUNTER — Other Ambulatory Visit

## 2023-12-02 ENCOUNTER — Encounter: Payer: Self-pay | Admitting: Neurosurgery

## 2023-12-03 ENCOUNTER — Other Ambulatory Visit (HOSPITAL_COMMUNITY): Payer: Self-pay

## 2023-12-03 DIAGNOSIS — G4733 Obstructive sleep apnea (adult) (pediatric): Secondary | ICD-10-CM | POA: Diagnosis not present

## 2023-12-03 NOTE — Telephone Encounter (Signed)
 Patient sending through mychart

## 2023-12-05 ENCOUNTER — Other Ambulatory Visit (HOSPITAL_COMMUNITY): Payer: Self-pay

## 2023-12-05 ENCOUNTER — Other Ambulatory Visit: Payer: Self-pay

## 2023-12-05 MED ORDER — LISDEXAMFETAMINE DIMESYLATE 20 MG PO CAPS
20.0000 mg | ORAL_CAPSULE | Freq: Every day | ORAL | 0 refills | Status: DC
  Filled 2023-12-05: qty 30, 30d supply, fill #0

## 2023-12-06 ENCOUNTER — Ambulatory Visit: Admitting: Registered Nurse

## 2023-12-06 ENCOUNTER — Encounter: Payer: Self-pay | Admitting: Neurosurgery

## 2023-12-06 ENCOUNTER — Ambulatory Visit: Payer: Self-pay | Admitting: Urgent Care

## 2023-12-06 ENCOUNTER — Other Ambulatory Visit: Payer: Self-pay

## 2023-12-06 ENCOUNTER — Observation Stay
Admission: RE | Admit: 2023-12-06 | Discharge: 2023-12-07 | Disposition: A | Discharge status: Home / Self Care | Attending: Neurosurgery | Admitting: Neurosurgery

## 2023-12-06 ENCOUNTER — Encounter
Admission: RE | Disposition: A | Discharge status: Home / Self Care | Payer: Self-pay | Source: Home / Self Care | Attending: Neurosurgery

## 2023-12-06 ENCOUNTER — Ambulatory Visit

## 2023-12-06 DIAGNOSIS — M4317 Spondylolisthesis, lumbosacral region: Secondary | ICD-10-CM | POA: Diagnosis not present

## 2023-12-06 DIAGNOSIS — E876 Hypokalemia: Secondary | ICD-10-CM

## 2023-12-06 DIAGNOSIS — Z79899 Other long term (current) drug therapy: Secondary | ICD-10-CM

## 2023-12-06 DIAGNOSIS — Z981 Arthrodesis status: Secondary | ICD-10-CM

## 2023-12-06 DIAGNOSIS — M4310 Spondylolisthesis, site unspecified: Secondary | ICD-10-CM | POA: Diagnosis not present

## 2023-12-06 DIAGNOSIS — M4306 Spondylolysis, lumbar region: Principal | ICD-10-CM | POA: Diagnosis present

## 2023-12-06 DIAGNOSIS — G8929 Other chronic pain: Secondary | ICD-10-CM | POA: Diagnosis not present

## 2023-12-06 DIAGNOSIS — I10 Essential (primary) hypertension: Secondary | ICD-10-CM | POA: Insufficient documentation

## 2023-12-06 DIAGNOSIS — M5442 Lumbago with sciatica, left side: Secondary | ICD-10-CM

## 2023-12-06 DIAGNOSIS — Z01818 Encounter for other preprocedural examination: Secondary | ICD-10-CM

## 2023-12-06 DIAGNOSIS — M4316 Spondylolisthesis, lumbar region: Secondary | ICD-10-CM | POA: Diagnosis not present

## 2023-12-06 DIAGNOSIS — Z01812 Encounter for preprocedural laboratory examination: Secondary | ICD-10-CM

## 2023-12-06 HISTORY — PX: TRANSFORAMINAL LUMBAR INTERBODY FUSION W/ MIS 1 LEVEL: SHX6145

## 2023-12-06 HISTORY — PX: APPLICATION OF INTRAOPERATIVE CT SCAN: SHX6668

## 2023-12-06 LAB — POCT I-STAT, CHEM 8
BUN: 14 mg/dL (ref 6–20)
Calcium, Ion: 1.19 mmol/L (ref 1.15–1.40)
Chloride: 101 mmol/L (ref 98–111)
Creatinine, Ser: 0.9 mg/dL (ref 0.61–1.24)
Glucose, Bld: 91 mg/dL (ref 70–99)
HCT: 40 % (ref 39.0–52.0)
Hemoglobin: 13.6 g/dL (ref 13.0–17.0)
Potassium: 4.1 mmol/L (ref 3.5–5.1)
Sodium: 137 mmol/L (ref 135–145)
TCO2: 25 mmol/L (ref 22–32)

## 2023-12-06 LAB — ABO/RH: ABO/RH(D): A POS

## 2023-12-06 SURGERY — MINIMALLY INVASIVE (MIS) TRANSFORAMINAL LUMBAR INTERBODY FUSION (TLIF) 1 LEVEL
Anesthesia: General | Site: Spine Lumbar

## 2023-12-06 MED ORDER — PHENYLEPHRINE HCL-NACL 20-0.9 MG/250ML-% IV SOLN
INTRAVENOUS | Status: DC | PRN
  Administered 2023-12-06: 40 ug/min via INTRAVENOUS

## 2023-12-06 MED ORDER — DEXAMETHASONE SODIUM PHOSPHATE 10 MG/ML IJ SOLN
INTRAMUSCULAR | Status: DC | PRN
  Administered 2023-12-06: 10 mg via INTRAVENOUS

## 2023-12-06 MED ORDER — PROPOFOL 1000 MG/100ML IV EMUL
INTRAVENOUS | Status: AC
  Filled 2023-12-06: qty 100

## 2023-12-06 MED ORDER — MIDAZOLAM HCL 2 MG/2ML IJ SOLN
INTRAMUSCULAR | Status: DC | PRN
  Administered 2023-12-06: 2 mg via INTRAVENOUS

## 2023-12-06 MED ORDER — OXYCODONE HCL 5 MG PO TABS
10.0000 mg | ORAL_TABLET | ORAL | Status: DC | PRN
  Administered 2023-12-07: 10 mg via ORAL
  Filled 2023-12-06: qty 2

## 2023-12-06 MED ORDER — ARTIFICIAL TEARS OPHTHALMIC OINT
TOPICAL_OINTMENT | OPHTHALMIC | Status: DC | PRN
  Filled 2023-12-06 (×2): qty 3.5

## 2023-12-06 MED ORDER — HYDROCHLOROTHIAZIDE 25 MG PO TABS
25.0000 mg | ORAL_TABLET | Freq: Every day | ORAL | Status: DC
  Administered 2023-12-06 – 2023-12-07 (×2): 25 mg via ORAL
  Filled 2023-12-06 (×2): qty 1

## 2023-12-06 MED ORDER — HYDROMORPHONE HCL 1 MG/ML IJ SOLN: 0.5000 mg | INTRAMUSCULAR | Status: DC | PRN

## 2023-12-06 MED ORDER — ACETAMINOPHEN 650 MG RE SUPP: 650.0000 mg | RECTAL | Status: DC | PRN

## 2023-12-06 MED ORDER — LACTATED RINGERS IV SOLN: INTRAVENOUS | Status: DC

## 2023-12-06 MED ORDER — SODIUM CHLORIDE (PF) 0.9 % IJ SOLN
INTRAMUSCULAR | Status: DC | PRN
  Administered 2023-12-06: 60 mL

## 2023-12-06 MED ORDER — VANCOMYCIN HCL 1000 MG IV SOLR
INTRAVENOUS | Status: AC
  Filled 2023-12-06: qty 20

## 2023-12-06 MED ORDER — KETOROLAC TROMETHAMINE 30 MG/ML IJ SOLN
INTRAMUSCULAR | Status: DC | PRN
  Administered 2023-12-06: 30 mg via INTRAVENOUS

## 2023-12-06 MED ORDER — PROPOFOL 10 MG/ML IV BOLUS
INTRAVENOUS | Status: AC
  Filled 2023-12-06: qty 20

## 2023-12-06 MED ORDER — CHLORHEXIDINE GLUCONATE 0.12 % MT SOLN
15.0000 mL | Freq: Once | OROMUCOSAL | Status: AC
  Administered 2023-12-06: 15 mL via OROMUCOSAL

## 2023-12-06 MED ORDER — SUCCINYLCHOLINE CHLORIDE 200 MG/10ML IV SOSY
PREFILLED_SYRINGE | INTRAVENOUS | Status: AC
  Filled 2023-12-06: qty 10

## 2023-12-06 MED ORDER — ACETAMINOPHEN 500 MG PO TABS
1000.0000 mg | ORAL_TABLET | Freq: Four times a day (QID) | ORAL | Status: AC
  Administered 2023-12-06 (×3): 1000 mg via ORAL
  Filled 2023-12-06 (×3): qty 2

## 2023-12-06 MED ORDER — CEFAZOLIN SODIUM-DEXTROSE 3-4 GM/150ML-% IV SOLN
3.0000 g | INTRAVENOUS | Status: AC
  Administered 2023-12-06: 3 g via INTRAVENOUS
  Filled 2023-12-06: qty 150

## 2023-12-06 MED ORDER — MENTHOL 3 MG MT LOZG: 1.0000 | LOZENGE | OROMUCOSAL | Status: DC | PRN

## 2023-12-06 MED ORDER — METHOCARBAMOL 500 MG PO TABS
500.0000 mg | ORAL_TABLET | Freq: Four times a day (QID) | ORAL | Status: DC | PRN
  Administered 2023-12-06 – 2023-12-07 (×2): 500 mg via ORAL
  Filled 2023-12-06: qty 1

## 2023-12-06 MED ORDER — BSS IO SOLN
15.0000 mL | Freq: Once | INTRAOCULAR | Status: AC
  Administered 2023-12-06: 15 mL
  Filled 2023-12-06 (×2): qty 15

## 2023-12-06 MED ORDER — FLUOXETINE HCL 20 MG PO CAPS
20.0000 mg | ORAL_CAPSULE | Freq: Every day | ORAL | Status: DC
Start: 2023-12-07 — End: 2023-12-07
  Administered 2023-12-07: 20 mg via ORAL
  Filled 2023-12-06: qty 1

## 2023-12-06 MED ORDER — BUPIVACAINE-EPINEPHRINE (PF) 0.5% -1:200000 IJ SOLN
INTRAMUSCULAR | Status: DC | PRN
  Administered 2023-12-06: 10 mL

## 2023-12-06 MED ORDER — SODIUM CHLORIDE 0.9 % IV SOLN: 250.0000 mL | INTRAVENOUS | Status: DC

## 2023-12-06 MED ORDER — SODIUM CHLORIDE (PF) 0.9 % IJ SOLN
INTRAMUSCULAR | Status: AC
  Filled 2023-12-06: qty 20

## 2023-12-06 MED ORDER — TRAZODONE HCL 50 MG PO TABS: 50.0000 mg | ORAL_TABLET | Freq: Every evening | ORAL | Status: DC | PRN

## 2023-12-06 MED ORDER — REMIFENTANIL HCL 1 MG IV SOLR
INTRAVENOUS | Status: DC | PRN
  Administered 2023-12-06: .1 ug/kg/min via INTRAVENOUS

## 2023-12-06 MED ORDER — VANCOMYCIN HCL IN DEXTROSE 1-5 GM/200ML-% IV SOLN
1000.0000 mg | Freq: Once | INTRAVENOUS | Status: AC
  Administered 2023-12-06: 1000 mg via INTRAVENOUS

## 2023-12-06 MED ORDER — MIDAZOLAM HCL 2 MG/2ML IJ SOLN
INTRAMUSCULAR | Status: AC
  Filled 2023-12-06: qty 2

## 2023-12-06 MED ORDER — HYDROMORPHONE HCL 1 MG/ML IJ SOLN
INTRAMUSCULAR | Status: AC
Start: 2023-12-06 — End: 2023-12-06
  Filled 2023-12-06: qty 1

## 2023-12-06 MED ORDER — CELECOXIB 200 MG PO CAPS
200.0000 mg | ORAL_CAPSULE | Freq: Two times a day (BID) | ORAL | Status: DC
  Administered 2023-12-06 – 2023-12-07 (×3): 200 mg via ORAL
  Filled 2023-12-06 (×3): qty 1

## 2023-12-06 MED ORDER — ACETAMINOPHEN 10 MG/ML IV SOLN
INTRAVENOUS | Status: AC
  Filled 2023-12-06: qty 100

## 2023-12-06 MED ORDER — PROPOFOL 10 MG/ML IV BOLUS
INTRAVENOUS | Status: DC | PRN
  Administered 2023-12-06 (×2): 100 mg via INTRAVENOUS
  Administered 2023-12-06: 150 ug/kg/min via INTRAVENOUS
  Administered 2023-12-06: 200 mg via INTRAVENOUS
  Administered 2023-12-06: 165 ug/kg/min via INTRAVENOUS

## 2023-12-06 MED ORDER — FENTANYL CITRATE (PF) 100 MCG/2ML IJ SOLN
INTRAMUSCULAR | Status: AC
  Filled 2023-12-06: qty 2

## 2023-12-06 MED ORDER — GABAPENTIN 300 MG PO CAPS
300.0000 mg | ORAL_CAPSULE | Freq: Three times a day (TID) | ORAL | Status: DC
  Administered 2023-12-06 – 2023-12-07 (×3): 300 mg via ORAL
  Filled 2023-12-06 (×3): qty 1

## 2023-12-06 MED ORDER — PANTOPRAZOLE SODIUM 40 MG PO TBEC
40.0000 mg | DELAYED_RELEASE_TABLET | Freq: Every day | ORAL | Status: DC
  Administered 2023-12-07: 40 mg via ORAL
  Filled 2023-12-06: qty 1

## 2023-12-06 MED ORDER — PHENYLEPHRINE HCL-NACL 20-0.9 MG/250ML-% IV SOLN
INTRAVENOUS | Status: AC
  Filled 2023-12-06: qty 250

## 2023-12-06 MED ORDER — LISDEXAMFETAMINE DIMESYLATE 20 MG PO CAPS
20.0000 mg | ORAL_CAPSULE | Freq: Every day | ORAL | Status: DC
  Filled 2023-12-06: qty 1

## 2023-12-06 MED ORDER — VANCOMYCIN HCL IN DEXTROSE 1-5 GM/200ML-% IV SOLN
INTRAVENOUS | Status: AC
  Filled 2023-12-06: qty 200

## 2023-12-06 MED ORDER — LORATADINE 10 MG PO TABS
10.0000 mg | ORAL_TABLET | Freq: Every day | ORAL | Status: DC
  Administered 2023-12-06 – 2023-12-07 (×2): 10 mg via ORAL
  Filled 2023-12-06 (×2): qty 1

## 2023-12-06 MED ORDER — FEBUXOSTAT 40 MG PO TABS
80.0000 mg | ORAL_TABLET | Freq: Every day | ORAL | Status: DC
  Administered 2023-12-07: 80 mg via ORAL
  Filled 2023-12-06: qty 2

## 2023-12-06 MED ORDER — LACTATED RINGERS IV SOLN: INTRAVENOUS | Status: DC | PRN

## 2023-12-06 MED ORDER — OXYCODONE HCL 5 MG PO TABS
5.0000 mg | ORAL_TABLET | Freq: Once | ORAL | Status: AC | PRN
  Administered 2023-12-06: 5 mg via ORAL

## 2023-12-06 MED ORDER — ACETAMINOPHEN 10 MG/ML IV SOLN
INTRAVENOUS | Status: DC | PRN
  Administered 2023-12-06: 1000 mg via INTRAVENOUS

## 2023-12-06 MED ORDER — REMIFENTANIL HCL 1 MG IV SOLR
INTRAVENOUS | Status: AC
Start: 2023-12-06 — End: 2023-12-06
  Filled 2023-12-06: qty 1000

## 2023-12-06 MED ORDER — IRRISEPT - 450ML BOTTLE WITH 0.05% CHG IN STERILE WATER, USP 99.95% OPTIME
TOPICAL | Status: DC | PRN
  Administered 2023-12-06: 450 mL via TOPICAL

## 2023-12-06 MED ORDER — LIDOCAINE HCL (PF) 2 % IJ SOLN
INTRAMUSCULAR | Status: AC
  Filled 2023-12-06: qty 5

## 2023-12-06 MED ORDER — ACETAMINOPHEN 325 MG PO TABS: 650.0000 mg | ORAL_TABLET | ORAL | Status: DC | PRN

## 2023-12-06 MED ORDER — GLYCOPYRROLATE 0.2 MG/ML IJ SOLN
INTRAMUSCULAR | Status: DC | PRN
Start: 2023-12-06 — End: 2023-12-06
  Administered 2023-12-06 (×2): .2 mg via INTRAVENOUS

## 2023-12-06 MED ORDER — ENOXAPARIN SODIUM 40 MG/0.4ML IJ SOSY
40.0000 mg | PREFILLED_SYRINGE | INTRAMUSCULAR | Status: DC
  Administered 2023-12-07: 40 mg via SUBCUTANEOUS
  Filled 2023-12-06: qty 0.4

## 2023-12-06 MED ORDER — FENTANYL CITRATE (PF) 100 MCG/2ML IJ SOLN
25.0000 ug | INTRAMUSCULAR | Status: DC | PRN
  Administered 2023-12-06: 50 ug via INTRAVENOUS
  Administered 2023-12-06 (×2): 25 ug via INTRAVENOUS

## 2023-12-06 MED ORDER — OXYCODONE HCL 5 MG PO TABS
ORAL_TABLET | ORAL | Status: AC
Start: 2023-12-06 — End: 2023-12-06
  Filled 2023-12-06: qty 1

## 2023-12-06 MED ORDER — VITAMIN D (ERGOCALCIFEROL) 1.25 MG (50000 UNIT) PO CAPS: 50000.0000 [IU] | ORAL_CAPSULE | ORAL | Status: DC

## 2023-12-06 MED ORDER — PHENOL 1.4 % MT LIQD: 1.0000 | OROMUCOSAL | Status: DC | PRN

## 2023-12-06 MED ORDER — DEXMEDETOMIDINE HCL IN NACL 80 MCG/20ML IV SOLN
INTRAVENOUS | Status: DC | PRN
Start: 2023-12-06 — End: 2023-12-06
  Administered 2023-12-06: 8 ug via INTRAVENOUS

## 2023-12-06 MED ORDER — SURGIFLO WITH THROMBIN (HEMOSTATIC MATRIX KIT) OPTIME
TOPICAL | Status: DC | PRN
  Administered 2023-12-06: 1 via TOPICAL

## 2023-12-06 MED ORDER — METHOCARBAMOL 1000 MG/10ML IJ SOLN: 500.0000 mg | Freq: Four times a day (QID) | INTRAMUSCULAR | Status: DC | PRN

## 2023-12-06 MED ORDER — FENTANYL CITRATE (PF) 100 MCG/2ML IJ SOLN
INTRAMUSCULAR | Status: DC | PRN
  Administered 2023-12-06 (×2): 50 ug via INTRAVENOUS

## 2023-12-06 MED ORDER — HYDROMORPHONE HCL 1 MG/ML IJ SOLN
INTRAMUSCULAR | Status: DC | PRN
  Administered 2023-12-06 (×2): .5 mg via INTRAVENOUS

## 2023-12-06 MED ORDER — CEFAZOLIN SODIUM-DEXTROSE 2-4 GM/100ML-% IV SOLN
INTRAVENOUS | Status: AC
  Filled 2023-12-06: qty 100

## 2023-12-06 MED ORDER — SUCCINYLCHOLINE CHLORIDE 200 MG/10ML IV SOSY
PREFILLED_SYRINGE | INTRAVENOUS | Status: DC | PRN
Start: 2023-12-06 — End: 2023-12-06
  Administered 2023-12-06: 100 mg via INTRAVENOUS

## 2023-12-06 MED ORDER — CHLORHEXIDINE GLUCONATE 0.12 % MT SOLN
OROMUCOSAL | Status: AC
  Filled 2023-12-06: qty 15

## 2023-12-06 MED ORDER — 0.9 % SODIUM CHLORIDE (POUR BTL) OPTIME
TOPICAL | Status: DC | PRN
  Administered 2023-12-06: 500 mL

## 2023-12-06 MED ORDER — CEFAZOLIN IN SODIUM CHLORIDE 2-0.9 GM/100ML-% IV SOLN
3.0000 g | Freq: Once | INTRAVENOUS | Status: DC
  Filled 2023-12-06: qty 200

## 2023-12-06 MED ORDER — POTASSIUM CHLORIDE IN NACL 20-0.9 MEQ/L-% IV SOLN
INTRAVENOUS | Status: DC
  Filled 2023-12-06: qty 1000

## 2023-12-06 MED ORDER — LIDOCAINE HCL (CARDIAC) PF 100 MG/5ML IV SOSY
PREFILLED_SYRINGE | INTRAVENOUS | Status: DC | PRN
  Administered 2023-12-06: 100 mg via INTRAVENOUS

## 2023-12-06 MED ORDER — SODIUM CHLORIDE 0.9% FLUSH: 3.0000 mL | INTRAVENOUS | Status: DC | PRN

## 2023-12-06 MED ORDER — LISINOPRIL 5 MG PO TABS
10.0000 mg | ORAL_TABLET | Freq: Every day | ORAL | Status: DC
  Administered 2023-12-07: 10 mg via ORAL
  Filled 2023-12-06 (×2): qty 2

## 2023-12-06 MED ORDER — ORAL CARE MOUTH RINSE: 15.0000 mL | Freq: Once | OROMUCOSAL | Status: AC

## 2023-12-06 MED ORDER — ONDANSETRON HCL 4 MG PO TABS: 4.0000 mg | ORAL_TABLET | Freq: Four times a day (QID) | ORAL | Status: DC | PRN

## 2023-12-06 MED ORDER — OXYCODONE HCL 5 MG PO TABS
5.0000 mg | ORAL_TABLET | ORAL | Status: DC | PRN
  Administered 2023-12-06 – 2023-12-07 (×2): 5 mg via ORAL
  Filled 2023-12-06 (×2): qty 1

## 2023-12-06 MED ORDER — BUPIVACAINE-EPINEPHRINE (PF) 0.5% -1:200000 IJ SOLN
INTRAMUSCULAR | Status: AC
  Filled 2023-12-06: qty 10

## 2023-12-06 MED ORDER — SODIUM CHLORIDE 0.9% FLUSH
3.0000 mL | Freq: Two times a day (BID) | INTRAVENOUS | Status: DC
  Administered 2023-12-06: 3 mL via INTRAVENOUS

## 2023-12-06 MED ORDER — ROSUVASTATIN CALCIUM 5 MG PO TABS
5.0000 mg | ORAL_TABLET | Freq: Every day | ORAL | Status: DC
  Administered 2023-12-06 – 2023-12-07 (×2): 5 mg via ORAL
  Filled 2023-12-06 (×2): qty 1

## 2023-12-06 MED ORDER — ONDANSETRON HCL 4 MG/2ML IJ SOLN
INTRAMUSCULAR | Status: AC
  Filled 2023-12-06: qty 2

## 2023-12-06 MED ORDER — BUPIVACAINE HCL (PF) 0.5 % IJ SOLN
INTRAMUSCULAR | Status: AC
  Filled 2023-12-06: qty 30

## 2023-12-06 MED ORDER — OXYCODONE HCL 5 MG/5ML PO SOLN: 5.0000 mg | Freq: Once | ORAL | Status: AC | PRN

## 2023-12-06 MED ORDER — ONDANSETRON HCL 4 MG/2ML IJ SOLN
INTRAMUSCULAR | Status: DC | PRN
  Administered 2023-12-06: 4 mg via INTRAVENOUS

## 2023-12-06 MED ORDER — ORAL CARE MOUTH RINSE: 15.0000 mL | OROMUCOSAL | Status: DC | PRN

## 2023-12-06 MED ORDER — DEXAMETHASONE SODIUM PHOSPHATE 10 MG/ML IJ SOLN
INTRAMUSCULAR | Status: AC
  Filled 2023-12-06: qty 1

## 2023-12-06 MED ORDER — KETOROLAC TROMETHAMINE 0.5 % OP SOLN
1.0000 [drp] | Freq: Four times a day (QID) | OPHTHALMIC | Status: DC
  Administered 2023-12-06: 1 [drp] via OPHTHALMIC
  Filled 2023-12-06 (×3): qty 3

## 2023-12-06 MED ORDER — METHOCARBAMOL 500 MG PO TABS
ORAL_TABLET | ORAL | Status: AC
  Filled 2023-12-06: qty 1

## 2023-12-06 MED ORDER — SENNA 8.6 MG PO TABS
1.0000 | ORAL_TABLET | Freq: Two times a day (BID) | ORAL | Status: DC
  Administered 2023-12-06 – 2023-12-07 (×3): 8.6 mg via ORAL
  Filled 2023-12-06 (×3): qty 1

## 2023-12-06 MED ORDER — BUPIVACAINE LIPOSOME 1.3 % IJ SUSP
INTRAMUSCULAR | Status: AC
  Filled 2023-12-06: qty 20

## 2023-12-06 MED ORDER — TETRACAINE HCL 0.5 % OP SOLN
1.0000 [drp] | Freq: Once | OPHTHALMIC | Status: AC
  Administered 2023-12-06: 1 [drp] via OPHTHALMIC
  Filled 2023-12-06: qty 4

## 2023-12-06 MED ORDER — ONDANSETRON HCL 4 MG/2ML IJ SOLN: 4.0000 mg | Freq: Four times a day (QID) | INTRAMUSCULAR | Status: DC | PRN

## 2023-12-06 SURGICAL SUPPLY — 54 items
ALLOGRAFT BONE FIBER KORE 5 (Bone Implant) IMPLANT
BASIN KIT SINGLE STR (MISCELLANEOUS) ×1 IMPLANT
BUR NEURO DRILL SOFT 3.0X3.8M (BURR) ×1 IMPLANT
CAGE SABLE 10X26 15D (Cage) IMPLANT
COVERAGE SUPP BRAINLAB NG SPNE (MISCELLANEOUS) ×1 IMPLANT
DERMABOND ADVANCED .7 DNX12 (GAUZE/BANDAGES/DRESSINGS) ×1 IMPLANT
DRAPE C-ARMOR (DRAPES) IMPLANT
DRAPE LAPAROTOMY 100X77 ABD (DRAPES) ×1 IMPLANT
DRAPE MICROSCOPE SPINE 48X150 (DRAPES) IMPLANT
DRAPE SCAN PATIENT (DRAPES) ×1 IMPLANT
DRAPE TABLE BACK 80X90 (DRAPES) ×1 IMPLANT
DRSG OPSITE POSTOP 4X8 (GAUZE/BANDAGES/DRESSINGS) IMPLANT
DRSG TEGADERM 4X4.75 (GAUZE/BANDAGES/DRESSINGS) IMPLANT
ELECTRODE EZSTD 165MM 6.5IN (MISCELLANEOUS) IMPLANT
ELECTRODE REM PT RTRN 9FT ADLT (ELECTROSURGICAL) ×1 IMPLANT
EVACUATOR 1/8 PVC DRAIN (DRAIN) IMPLANT
EX-PIN ORTHOLOCK NAV 4X150 (PIN) IMPLANT
FEE CVG SUPP BRAINLAB NG SPNE (MISCELLANEOUS) ×1 IMPLANT
FEE INTRAOP CADWELL SUPPLY NCS (MISCELLANEOUS) IMPLANT
FEE INTRAOP MONITOR IMPULS NCS (MISCELLANEOUS) IMPLANT
GAUZE 4X4 16PLY ~~LOC~~+RFID DBL (SPONGE) IMPLANT
GAUZE SPONGE 2X2 STRL 8-PLY (GAUZE/BANDAGES/DRESSINGS) IMPLANT
GLOVE BIOGEL PI IND STRL 6.5 (GLOVE) ×2 IMPLANT
GLOVE SURG SYN 6.5 PF PI (GLOVE) ×2 IMPLANT
GLOVE SURG SYN 8.5 PF PI (GLOVE) ×4 IMPLANT
GOWN SRG LRG LVL 4 IMPRV REINF (GOWNS) ×3 IMPLANT
GOWN SRG XL LVL 3 NONREINFORCE (GOWNS) ×1 IMPLANT
GUIDEWIRE NITINOL BEVEL TIP (WIRE) IMPLANT
HOLDER FOLEY CATH W/STRAP (MISCELLANEOUS) IMPLANT
KIT PREVENA INCISION MGT 13 (CANNISTER) IMPLANT
KIT SPINAL PRONEVIEW (KITS) ×1 IMPLANT
LAVAGE JET IRRISEPT WOUND (IRRIGATION / IRRIGATOR) ×1 IMPLANT
MANIFOLD NEPTUNE II (INSTRUMENTS) ×1 IMPLANT
MARKER SPHERE PSV REFLC 13MM (MARKER) ×7 IMPLANT
NDL SAFETY ECLIPSE 18X1.5 (NEEDLE) ×1 IMPLANT
NS IRRIG 500ML POUR BTL (IV SOLUTION) ×1 IMPLANT
PACK LAMINECTOMY ARMC (PACKS) ×1 IMPLANT
ROD RELINE MAS LORD 5.5X45MM (Rod) IMPLANT
SCREW LOCK RELINE 5.5 TULIP (Screw) IMPLANT
SCREW MAS RED RELINE 6.5X55 (Screw) IMPLANT
SCREW RELINE RED 6.5X50MM POLY (Screw) IMPLANT
STAPLER SKIN PROX 35W (STAPLE) IMPLANT
SURGIFLO W/THROMBIN 8M KIT (HEMOSTASIS) ×1 IMPLANT
SUT PDS AB 0 CT1 27 (SUTURE) IMPLANT
SUT PDS AB 2-0 CT1 27 (SUTURE) IMPLANT
SUT STRATA 3-0 15 PS-2 (SUTURE) ×1 IMPLANT
SUT VIC AB 0 CT1 27XCR 8 STRN (SUTURE) ×2 IMPLANT
SUT VIC AB 2-0 CT1 18 (SUTURE) ×2 IMPLANT
SUTURE EHLN 3-0 FS-10 30 BLK (SUTURE) IMPLANT
SYR 30ML LL (SYRINGE) ×2 IMPLANT
TOWEL OR 17X26 4PK STRL BLUE (TOWEL DISPOSABLE) ×1 IMPLANT
TRAP FLUID SMOKE EVACUATOR (MISCELLANEOUS) ×1 IMPLANT
TRAY FOLEY SLVR 16FR LF STAT (SET/KITS/TRAYS/PACK) IMPLANT
WATER STERILE IRR 1000ML POUR (IV SOLUTION) IMPLANT

## 2023-12-06 NOTE — Transfer of Care (Signed)
 Immediate Anesthesia Transfer of Care Note  Patient: Andrew GORMAN Lloyd, MD  Procedure(s) Performed: MINIMALLY INVASIVE (MIS) TRANSFORAMINAL LUMBAR INTERBODY FUSION (TLIF) 1 LEVEL (Spine Lumbar) APPLICATION OF INTRAOPERATIVE CT SCAN (Spine Lumbar)  Patient Location: PACU  Anesthesia Type:General  Level of Consciousness: awake and patient cooperative  Airway & Oxygen Therapy: Patient Spontanous Breathing and Patient connected to face mask oxygen  Post-op Assessment: Report given to RN and Post -op Vital signs reviewed and stable  Post vital signs: Reviewed and stable  Last Vitals:  Vitals Value Taken Time  BP 130/85 12/06/23 10:54  Temp 36.5 C 12/06/23 10:54  Pulse 84 12/06/23 10:56  Resp 21 12/06/23 10:56  SpO2 100 % 12/06/23 10:56    Last Pain:  Vitals:   12/06/23 0626  TempSrc: Temporal  PainSc: 0-No pain         Complications: No notable events documented.

## 2023-12-06 NOTE — Anesthesia Procedure Notes (Signed)
 Procedure Name: Intubation Date/Time: 12/06/2023 7:24 AM  Performed by: Lorrene Camelia LABOR, CRNAPre-anesthesia Checklist: Patient identified, Patient being monitored, Timeout performed, Emergency Drugs available and Suction available Patient Re-evaluated:Patient Re-evaluated prior to induction Oxygen Delivery Method: Circle system utilized Preoxygenation: Pre-oxygenation with 100% oxygen Induction Type: IV induction Ventilation: Mask ventilation without difficulty Laryngoscope Size: 3, McGrath and 4 Grade View: Grade II Tube type: Oral Tube size: 7.5 mm Number of attempts: 1 Airway Equipment and Method: Stylet Placement Confirmation: ETT inserted through vocal cords under direct vision, positive ETCO2 and breath sounds checked- equal and bilateral Secured at: 23 cm Tube secured with: Tape Dental Injury: Teeth and Oropharynx as per pre-operative assessment  Comments: Soft bite block

## 2023-12-06 NOTE — H&P (Signed)
 Referring Physician:  No referring provider defined for this encounter.  Primary Physician:  Geofm Glade PARAS, MD  History of Present Illness: 12/06/2023 Dr. Joane presents today with continued symptoms.  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  He gets pain into his left hip radiating down his left lateral thigh and left lateral calf and occasionally to the top of his foot.  He gets intermittent numbness and tingling in his legs.  This has been ongoing for 5 years but worsening over the past 4 to 5 months.  Extension of his back and standing make it worse.  Sitting and flexion improve his symptoms.  He has noted that his symptoms have been progressively worsening over time.  He has tried physical therapy without improvement.  He has tried NSAIDs for more than 6 weeks without improvement.  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  The symptoms are causing a significant impact on the patient's life.   I have utilized the care everywhere function in epic to review the outside records available from external health systems.  Review of Systems:  A 10 point review of systems is negative, except for the pertinent positives and negatives detailed in the HPI.  Past Medical History: Past Medical History:  Diagnosis Date   ADHD    Anxiety    Arthritis    Back pain    Complication of anesthesia    a.) PONV   Erectile dysfunction    a.) on PDE5i (tadalafil ) PRN   Fatty  liver    GERD (gastroesophageal reflux disease)    Gout    Hypertension    Lactose intolerance    OSA (obstructive sleep apnea)    Pars defect with spondylolisthesis    PONV (postoperative nausea and vomiting)    Seasonal allergies     Past Surgical History: Past Surgical History:  Procedure Laterality Date   bone marrow donation     COLONOSCOPY     ELBOW SURGERY Left    KNEE SURGERY     VASECTOMY     WISDOM TOOTH EXTRACTION      Allergies: Allergies as of 10/04/2023 - Review Complete 09/12/2023  Allergen Reaction Noted   Sulfa antibiotics Nausea And Vomiting 02/23/2015    Medications:  Current Facility-Administered Medications:    ceFAZolin (ANCEF) IVPB 3g/150 mL premix, 3 g, Intravenous, 60 min Pre-Op, Clois Fret, MD   lactated ringers infusion, , Intravenous, Continuous, Chesley Lendia CROME, MD, Last Rate: 10 mL/hr at 12/06/23 0631, New Bag at 12/06/23 0631   vancomycin (VANCOCIN) IVPB 1000 mg/200 mL premix, 1,000 mg, Intravenous, Once, Clois Fret, MD, Last Rate: 200 mL/hr at 12/06/23 9367, 1,000 mg at 12/06/23 9367  Social History: Social History   Tobacco Use   Smoking status: Never    Passive exposure: Never   Smokeless tobacco: Never  Vaping Use   Vaping status: Never Used  Substance Use Topics   Alcohol use: Yes    Comment: rarely   Drug use: No    Family Medical  History: Family History  Problem Relation Age of Onset   Anxiety disorder Mother    Colon cancer Neg Hx    Liver disease Neg Hx    Esophageal cancer Neg Hx     Physical Examination: Vitals:   12/06/23 0626  BP: (!) 148/93  Pulse: (!) 57  Resp: 16  Temp: 97.6 F (36.4 C)  SpO2: 97%   Heart sounds normal no MRG. Chest Clear to Auscultation Bilaterally.  General: Patient is in no apparent distress. Attention to examination is appropriate.  Neck:   Supple.  Full range of motion.  Respiratory: Patient is breathing without any difficulty.   NEUROLOGICAL:     Awake,  alert, oriented to person, place, and time.  Speech is clear and fluent.   Cranial Nerves: Pupils equal round and reactive to light.  Facial tone is symmetric.  Facial sensation is symmetric. Shoulder shrug is symmetric. Tongue protrusion is midline.  There is no pronator drift.  Strength: Side Biceps Triceps Deltoid Interossei Grip Wrist Ext. Wrist Flex.  R 5 5 5 5 5 5 5   L 5 5 5 5 5 5 5    Side Iliopsoas Quads Hamstring PF DF EHL  R 5 5 5 5 5 5   L 5 5 5 5 5 5    Reflexes are 1+ and symmetric at the biceps, triceps, brachioradialis, patella and achilles.   Hoffman's is absent.   Bilateral upper and lower extremity sensation is intact to light touch.    No evidence of dysmetria noted.  Gait is normal.    FABER negative bilaterally  Medical Decision Making  Imaging: MRI L spine 08/14/2023 IMPRESSION: 1. Slight progression of grade 1 anterolisthesis at L5-S1 secondary to bilateral L5 pars interarticularis defects. 2. Stable moderate foraminal narrowing bilaterally at L5-S1, left greater than right. 3. Stable shallow central disc protrusion and annular tear at L4-5 without significant central or foraminal stenosis. 4. Mild disc desiccation and bulging at L2-3 and L3-4 without significant focal stenosis.     Electronically Signed   By: Lonni Necessary M.D.   On: 08/14/2023 16:06  L spine xrays 07/25/2023 IMPRESSION: Chronic pars defect of L5 noted with grade 1 anterolisthesis of L5 on S1 unchanged compared to prior exam.     Electronically Signed   By: Craig Farr M.D.   On: 08/01/2023 16:05  I have personally reviewed the images and agree with the above interpretation.  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  He has foraminal narrowing at L5-S1 that is worse on the left than the right.  He has left-sided sciatica secondary to this.  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  He has back pain with left-sided sciatica.  Over the past 9 years, his spondylolisthesis has worsened and his disc height has diminished.  His findings have been progressive over time which imply relative instability.  At this time, he has tried physical therapy without improvement.  We will proceed with L5/S1 MIS TLIF.    Thank you for involving me in the care of this patient.      Paxon Propes K. Clois MD, [facility] Neurosurgery

## 2023-12-06 NOTE — Op Note (Signed)
 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 The patient tried and failed conservative management, prompting surgical intervention.  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   Preoperative Note:   Risks of surgery discussed include: infection, bleeding, stroke, coma, death, paralysis, CSF leak, nerve/spinal cord injury, numbness, tingling, weakness, complex regional pain syndrome, recurrent stenosis and/or disc herniation, vascular injury, development of instability, neck/back pain, need for further surgery, persistent symptoms, development of deformity, and the risks of anesthesia. The patient understood these risks and agreed to proceed.  Operative Note:  1. Transforaminal Lumbar Interbody Fusion L5/S1 2. Posterolateral arthrodesis L5 to S1 3. Posterior nonsegmental instrumentation L5 to S1 using Nuvasive Reline 4. Use of stereotaxis ([employer]rainlab) 5. Harvesting of autograft via the same incision 6. Placement of a biomechanical device (Globus Sable) at L5/S1 for anterior arthrodesis  The patient was brought to the Operating Room, intubated and turned into the prone position. All pressure points were checked and double checked. The patient was prepped and draped in the standard fashion. A full timeout was performed. Preoperative antibiotics were given.   After draping, the stereotactic array was placed.  Stereotactic imaging was acquired and registered to the [place] system.  The image guidance system was used to choose bilateral [place] incisions. The  incisions were injected with local anesthetic.  Each incision was opened with a scalpel, bovie electrocautery was used to open the fascia.  Using stereotactic guidance, each pedicle from L5-S1 was cannulated and K wire secured.  We then placed pedicle screws over each K wire on the contralateral side.  We placed the ipsilateral pedicle screws after the TLIF procedure was performed.  Nuvasive Reline screws were used bilaterally at each level.   After placement of pedicle screws, we then turned our attention to transforaminal lumbar interbody fusion.  A tubular retractor was advanced over the left L5/S1 facet. The microscope was brought into the field.  The left L5/S1 facet was removed with osteotomes and the drill, and handed off for preparation as autograft. The traversing and exiting nerve roots on the left were identified and protected. The disc was opened using a scalpel. After incising the disc space, we took a combination of pituitary rongeurs, Kerrison rongeurs, disc scrapers, and curettes to remove a majority of the disc material.  We prepared the end plates for accepting the interbody fusion.  We removed the cartilaginous plate, preserved the cortical endplate if possible during this procedure.  The disc space was irrigated.  The Globus Sable biomechanical device was inserted, then backfilled with a mixture of allograft and autograft. During placement, the nerve roots and dural sac were carefully protected without any leaks identified. After placement of the device, the canal was fully inspected and all nerve roots were checked to ensure full decompression.  The retractor was removed.  Rods measured and placed. The rods were secured using locking caps to manufacturer's specifications. CT scan was taken to confirm instrumentation placement. The wound was copiously irrigated, then the posterior elements were prepared for arthrodesis.  A mixture of allograft and autograft was placed over the  decorticated surfaces for arthrodesis.   A subfascial drain  was placed on the left.  After hemostasis, the wound was closed in layers with 0 and 2-0 PDS. Staples were used on the skin. A sterile dressing was placed.  The patient was then flipped supine and extubated with incident. All counts were correct times 2 at the end of the case. No immediate complications were noted.  I performed the procedure.   Reeves Daisy MD

## 2023-12-06 NOTE — Discharge Instructions (Addendum)
 Your surgeon has performed an operation on your lumbar spine (low back) to relieve pressure on one or more nerves. Many times, patients feel better immediately after surgery and can "overdo it." Even if you feel well, it is important that you follow these activity guidelines. If you do not let your back heal properly from the surgery, you can increase the chance of hardware complications and/or return of your symptoms. The following are instructions to help in your recovery once you have been discharged from the hospital.  Do not use NSAIDs for 3 months after surgery.  *Regarding compression stockings-  Please wear day and night until you are walking a couple hundred feet three times a day.   Activity    No bending, lifting, or twisting ("BLT"). Avoid lifting objects heavier than 10 pounds (gallon milk jug).  Where possible, avoid household activities that involve lifting, bending, pushing, or pulling such as laundry, vacuuming, grocery shopping, and childcare. Try to arrange for help from friends and family for these activities while your back heals.  Increase physical activity slowly as tolerated.  Taking short walks is encouraged, but avoid strenuous exercise. Do not jog, run, bicycle, lift weights, or participate in any other exercises unless specifically allowed by your doctor. Avoid prolonged sitting, including car rides.  Talk to your doctor before resuming sexual activity.  You should not drive until cleared by your doctor.  Until released by your doctor, you should not return to work or school.  You should rest at home and let your body heal.   You may shower three days after your surgery.  After showering, lightly dab your incision dry. Do not take a tub bath or go swimming for 3 weeks, or until approved by your doctor at your follow-up appointment.  If you smoke, we strongly recommend that you quit.  Smoking has been proven to interfere with normal healing in your back and will  dramatically reduce the success rate of your surgery. Please contact [facility] (800-QUIT-NOW) and use the resources at www.[facility].com for assistance in stopping smoking.  Surgical Incision   If you have a dressing on your incision, you may remove it three days after your surgery. Keep your incision area clean and dry.  Your incision was closed with Dermabond glue. The glue should begin to peel away within about a week.  Diet            You may return to your usual diet. Be sure to stay hydrated.  When to Contact Us   Although your surgery and recovery will likely be uneventful, you may have some residual numbness, aches, and pains in your back and/or legs. This is normal and should improve in the next few weeks.  However, should you experience any of the following, contact us  immediately: New numbness or weakness Pain that is progressively getting worse, and is not relieved by your pain medications or rest Bleeding, redness, swelling, pain, or drainage from surgical incision Chills or flu-like symptoms Fever greater than 101.0 F (38.3 C) Problems with bowel or bladder functions Difficulty breathing or shortness of breath Warmth, tenderness, or swelling in your calf  Contact Information How to contact us :  If you have any questions/concerns before or after surgery, you can reach us  at [phone], or you can send a mychart message. We can be reached by phone or mychart 8am-4pm, Monday-Friday.  *Please note: Calls after 4pm are forwarded to a third party answering service. Mychart messages are not routinely monitored during evenings,  weekends, and holidays. Please call our office to contact the answering service for urgent concerns during non-business hours.

## 2023-12-06 NOTE — Anesthesia Preprocedure Evaluation (Signed)
 Anesthesia Evaluation  Patient identified by MRN, date of birth, ID band Patient awake    Reviewed: Allergy & Precautions, NPO status , Patient's Chart, lab work & pertinent test results  Airway Mallampati: I  TM Distance: >3 FB Neck ROM: full    Dental  (+) Dental Advidsory Given, Teeth Intact   Pulmonary neg pulmonary ROS   Pulmonary exam normal        Cardiovascular hypertension, On Medications negative cardio ROS Normal cardiovascular exam     Neuro/Psych  PSYCHIATRIC DISORDERS Anxiety     negative neurological ROS     GI/Hepatic Neg liver ROS,GERD  Medicated,,  Endo/Other  negative endocrine ROS    Renal/GU      Musculoskeletal   Abdominal   Peds  Hematology negative hematology ROS (+)   Anesthesia Other Findings Past Medical History: No date: ADHD No date: Anxiety No date: Arthritis No date: Back pain No date: Complication of anesthesia     Comment:  a.) PONV No date: Erectile dysfunction     Comment:  a.) on PDE5i (tadalafil ) PRN No date: Fatty liver No date: GERD (gastroesophageal reflux disease) No date: Gout No date: Hypertension No date: Lactose intolerance No date: OSA (obstructive sleep apnea) 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 No date: PONV (postoperative nausea and vomiting) No date: Seasonal allergies  Past Surgical History: No date: bone marrow donation No date: COLONOSCOPY No date: ELBOW SURGERY; Left No date: KNEE SURGERY No date: VASECTOMY No date: WISDOM TOOTH EXTRACTION     Reproductive/Obstetrics negative OB ROS                              Anesthesia Physical Anesthesia Plan  ASA: 2  Anesthesia Plan: General ETT   Post-op Pain Management:    Induction: Intravenous  PONV Risk Score and Plan: 2 and Ondansetron, Dexamethasone  and Midazolam  Airway Management Planned: Oral ETT  Additional Equipment:   Intra-op Plan:    Post-operative Plan: Extubation in OR  Informed Consent: I have reviewed the patients History and Physical, chart, labs and discussed the procedure including the risks, benefits and alternatives for the proposed anesthesia with the patient or authorized representative who has indicated his/her understanding and acceptance.     Dental Advisory Given  Plan Discussed with: Anesthesiologist, CRNA and Surgeon  Anesthesia Plan Comments: (Patient consented for risks of anesthesia including but not limited to:  - adverse reactions to medications - damage to eyes, teeth, lips or other oral mucosa - nerve damage due to positioning  - sore throat or hoarseness - Damage to heart, brain, nerves, lungs, other parts of body or loss of life  Patient voiced understanding and assent.)        Anesthesia Quick Evaluation

## 2023-12-06 NOTE — Progress Notes (Signed)
 Patient ambulated throughout unit and down the hallway twice with walker. Tolerated well, denies pain.

## 2023-12-06 NOTE — Anesthesia Postprocedure Evaluation (Signed)
 Anesthesia Post Note  Patient: Artist GORMAN Lloyd, MD  Procedure(s) Performed: MINIMALLY INVASIVE (MIS) TRANSFORAMINAL LUMBAR INTERBODY FUSION (TLIF) 1 LEVEL (Spine Lumbar) APPLICATION OF INTRAOPERATIVE CT SCAN (Spine Lumbar)  Patient location during evaluation: PACU Anesthesia Type: General Level of consciousness: awake and alert Pain management: pain level controlled Vital Signs Assessment: post-procedure vital signs reviewed and stable Respiratory status: spontaneous breathing, nonlabored ventilation, respiratory function stable and patient connected to nasal cannula oxygen Cardiovascular status: blood pressure returned to baseline and stable Postop Assessment: no apparent nausea or vomiting Anesthetic complications: no   No notable events documented.   Last Vitals:  Vitals:   12/06/23 1230 12/06/23 1245  BP: (!) 148/94 132/83  Pulse: 65 75  Resp: 16 17  Temp: 36.6 C 36.6 C  SpO2: 97% 96%    Last Pain:  Vitals:   12/06/23 1245  TempSrc:   PainSc: 5                  Debby Mines

## 2023-12-07 ENCOUNTER — Other Ambulatory Visit (HOSPITAL_COMMUNITY): Payer: Self-pay

## 2023-12-07 ENCOUNTER — Encounter: Payer: Self-pay | Admitting: Neurosurgery

## 2023-12-07 DIAGNOSIS — M4317 Spondylolisthesis, lumbosacral region: Secondary | ICD-10-CM | POA: Diagnosis not present

## 2023-12-07 MED ORDER — OXYCODONE HCL 10 MG PO TABS
10.0000 mg | ORAL_TABLET | Freq: Four times a day (QID) | ORAL | 0 refills | Status: AC | PRN
  Filled 2023-12-07: qty 28, 7d supply, fill #0

## 2023-12-07 MED ORDER — CELECOXIB 200 MG PO CAPS
200.0000 mg | ORAL_CAPSULE | Freq: Two times a day (BID) | ORAL | 0 refills | Status: AC
  Filled 2023-12-07: qty 28, 14d supply, fill #0

## 2023-12-07 MED ORDER — SENNA 8.6 MG PO TABS
1.0000 | ORAL_TABLET | Freq: Two times a day (BID) | ORAL | 0 refills | Status: DC
  Filled 2023-12-07: qty 120, 60d supply, fill #0

## 2023-12-07 MED ORDER — METHOCARBAMOL 500 MG PO TABS
500.0000 mg | ORAL_TABLET | Freq: Four times a day (QID) | ORAL | 0 refills | Status: AC | PRN
  Filled 2023-12-07: qty 56, 14d supply, fill #0

## 2023-12-07 NOTE — Plan of Care (Signed)
  Problem: Activity: Goal: Risk for activity intolerance will decrease 12/07/2023 1215 by Sergio Copas H, LPN Outcome: Progressing 12/07/2023 1042 by Sergio Copas H, LPN Outcome: Progressing   Problem: Nutrition: Goal: Adequate nutrition will be maintained 12/07/2023 1215 by Sergio Copas H, LPN Outcome: Progressing 12/07/2023 1042 by Sergio Copas H, LPN Outcome: Progressing   Problem: Coping: Goal: Level of anxiety will decrease 12/07/2023 1215 by Sergio Copas H, LPN Outcome: Progressing 12/07/2023 1042 by Sergio Copas H, LPN Outcome: Progressing   Problem: Elimination: Goal: Will not experience complications related to bowel motility 12/07/2023 1215 by Sergio Copas H, LPN Outcome: Progressing 12/07/2023 1042 by Sergio Copas H, LPN Outcome: Progressing

## 2023-12-07 NOTE — Progress Notes (Signed)
 Patient was given verbal and written discharge instructions, He acknowledges understanding and states he will comply. Drain was pulled by MD and clean dry dressing was applied. Patient declined to wait for DME, It will be delivered to home. Patient was taken to car by wheelchair and no distress noted when leaving.

## 2023-12-07 NOTE — Progress Notes (Signed)
 Physical Therapy Evaluation & Discharge Patient Details Name: Andrew GAINS, MD MRN: [id number] DOB: 17-Jul-1980 Today's Date: 12/07/2023  History of Present Illness  Pt is a 43 y/o male s/p TLIF L5-S1 secondary to intermittent back pain with LE radicular pain. PMH including but not limited to Pars defect with spondylolisthesis, HTN, gout.  Clinical Impression  Pt presented supine in bed with HOB elevated, awake and willing to participate in therapy session. Prior to admission, pt reported that he was independent with all functional mobility and ADLs. Pt is typically very active, and enjoys weight lifting, walking and nature/wild life photography. Pt lives with his spouse and children in a two level home with four steps to enter (with rail). At the time of evaluation, pt overall moving very well, completing bed mobility and transfers at a mod I level with good utilization of proper log roll technique. He also ambulated with supervision with use of RW and participated in stair training without difficulties. Pt was able to recall 3/3 back precautions without prompting and PT reviewed a generalize walking program with pt to implement upon d/c home. Pt will require a RW prior to d/c home and f/u OPPT once cleared by MD (not initially). No further acute PT needs identified at this time. PT signing off.       If plan is discharge home, recommend the following: Assistance with cooking/housework;Help with stairs or ramp for entrance   Can travel by private vehicle        Equipment Recommendations Rolling walker (2 wheels);Other (comment) (declined 3-in-1)  Recommendations for Other Services       Functional Status Assessment Patient has had a recent decline in their functional status and demonstrates the ability to make significant improvements in function in a reasonable and predictable amount of time.     Precautions / Restrictions Precautions Precautions: Back Precaution Booklet Issued: No Recall  of Precautions/Restrictions: Intact Precaution/Restrictions Comments: pt able to recall 3/3 back precautions independently; PT reviewed throughout session in regards to functional mobility, ADLs/IADLs Required Braces or Orthoses:  (No brace needed per MD order) Restrictions Weight Bearing Restrictions Per Provider Order: No      Mobility  Bed Mobility Overal bed mobility: Modified Independent             General bed mobility comments: good utilization of log roll technique    Transfers Overall transfer level: Modified independent Equipment used: Rolling walker (2 wheels)               General transfer comment: steady with transitional movement    Ambulation/Gait Ambulation/Gait assistance: Supervision Gait Distance (Feet): 500 Feet Assistive device: Rolling walker (2 wheels) Gait Pattern/deviations: Step-through pattern Gait velocity: mildly decreased     General Gait Details: pt with steady gait with use of RW, but not heavily reliant on bilateral UEs on AD; no LOB or need for physical assistance  Stairs Stairs: Yes Stairs assistance: Supervision Stair Management: One rail Right, Step to pattern, Sideways, Alternating pattern, Forwards Number of Stairs: 10 General stair comments: PT demonstrating and instructing pt in lateral step-to pattern with use of one rail to simulate home set-up; pt able to easily navigate the stairs in this manner. Attempted to ascend 2-3 steps with one rail forwards using an alternating reciprocal pattern but with increased pain noted  Wheelchair Mobility     Tilt Bed    Modified Rankin (Stroke Patients Only)       Balance Overall balance assessment: Mild deficits observed, not  formally tested                                           Pertinent Vitals/Pain Pain Assessment Pain Assessment: 0-10 Pain Score: 5  Pain Location: surgical site Pain Descriptors / Indicators: Sore Pain Intervention(s):  Monitored during session    Home Living Family/patient expects to be discharged to:: Private residence Living Arrangements: Spouse/significant other;Children Available Help at Discharge: Family Type of Home: House Home Access: Stairs to enter Entrance Stairs-Rails: Doctor, general practice of Steps: 4 Alternate Level Stairs-Number of Steps: flight Home Layout: Two level Home Equipment: None      Prior Function Prior Level of Function : Independent/Modified Independent;Working/employed;Driving             Mobility Comments: very active; enjoys weight lifting, walking and natural wild life photography ADLs Comments: ind     Extremity/Trunk Assessment   Upper Extremity Assessment Upper Extremity Assessment: Defer to OT evaluation;Overall [facility] for tasks assessed    Lower Extremity Assessment Lower Extremity Assessment: Overall WFL for tasks assessed    Cervical / Trunk Assessment Cervical / Trunk Assessment: Back Surgery  Communication   Communication Communication: No apparent difficulties    Cognition Arousal: Alert Behavior During Therapy: WFL for tasks assessed/performed   PT - Cognitive impairments: No apparent impairments                         Following commands: Intact       Cueing Cueing Techniques: Verbal cues     General Comments      Exercises     Assessment/Plan    PT Assessment Patient does not need any further PT services  PT Problem List         PT Treatment Interventions      PT Goals (Current goals can be found in the Care Plan section)  Acute Rehab PT Goals Patient Stated Goal: decrease pain, return to full activity level PT Goal Formulation: All assessment and education complete, DC therapy    Frequency       Co-evaluation               AM-PAC PT 6 Clicks Mobility  Outcome Measure Help needed turning from your back to your side while in a flat bed without using bedrails?: None Help needed  moving from lying on your back to sitting on the side of a flat bed without using bedrails?: None Help needed moving to and from a bed to a chair (including a wheelchair)?: None Help needed standing up from a chair using your arms (e.g., wheelchair or bedside chair)?: None Help needed to walk in hospital room?: None Help needed climbing 3-5 steps with a railing? : None 6 Click Score: 24    End of Session   Activity Tolerance: Patient tolerated treatment well Patient left: in bed;with call bell/phone within reach Nurse Communication: Mobility status PT Visit Diagnosis: Pain Pain - part of body:  (back)    Time: 9183-9157 PT Time Calculation (min) (ACUTE ONLY): 26 min   Charges:   PT Evaluation $PT Eval Low Complexity: 1 Low PT Treatments $Gait Training: 8-22 mins PT General Charges $$ ACUTE PT VISIT: 1 Visit         Delon DELENA KLEIN, DPT  Acute Rehabilitation Services Office [phone]   Delon HERO Anirudh Baiz 12/07/2023, 9:31 AM

## 2023-12-07 NOTE — TOC Transition Note (Signed)
 Transition of Care [facility]) - Discharge Note   Patient Details  Name: Andrew FUHRMANN, MD MRN: [id number] Date of Birth: 10/20/1980  [facility] CM/SW Contact:  Marinda Cooks, RN Phone Number: 12/07/2023, 10:31 AM   Clinical Narrative:    This CM updated by covering MD pt medically cleared to dc today and has active DC order . This CM spoke with pt regarding recommendation for [facility] pt declined and informed he would purchase RW out of pocket .  DC transportation confirmed for pt with wife who was present at bedside. Medical team updated . No additional DC needs requested by medical team or identified by CM at this time .     Final next level of care: Home/Self Care Barriers to Discharge: No Barriers Identified       Name of family member notified: Patient Patient and family notified of of transfer: 12/07/23  Discharge Plan and Services Additional resources added to the After Visit Summary for                  DME Arranged: Walker rolling DME arranged with [employer] informed pt's insurance was not in network Date DME Agency Contacted: 12/07/23 Time DME Agency Contacted: 1030 Representative spoke with at DME Agency: A[facility]            Social Drivers of Health (SDOH) Interventions SDOH Screenings   Food Insecurity: No Food Insecurity (12/06/2023)  Housing: Low Risk  (12/06/2023)  Transportation Needs: No Transportation Needs (12/06/2023)  Utilities: Not At Risk (12/06/2023)  Alcohol Screen: Low Risk  (05/29/2023)  Depression (PHQ2-9): Medium Risk (05/31/2023)  Financial Resource Strain: Low Risk  (05/29/2023)  Physical Activity: Insufficiently Active (05/29/2023)  Social Connections: Socially Isolated (05/29/2023)  Stress: Stress Concern Present (05/29/2023)  Tobacco Use: Low Risk  (12/06/2023)     Readmission Risk Interventions     No data to display

## 2023-12-07 NOTE — Evaluation (Addendum)
 Occupational Therapy Evaluation Patient Details Name: Andrew MAUL, MD MRN: [id number] DOB: May 26, 1980 Today's Date: 12/07/2023   History of Present Illness   Pt is a 43 y/o male s/p TLIF L5-S1 secondary to intermittent back pain with LE radicular pain. PMHx includes: Pars defect with spondylolisthesis, HTN, gout.     Clinical Impressions Pt. Presents with  limited functional reaching to the LEs which hinders his ability to complete LE ADL and IADL tasks efficiently. Pt. Resides at home with his wife, and children. Pt. Was independent with ADLs, and IADL functioning: including meal preparation, and medication management. Pt. Was able to drive and was actively working. Pt. Education was provided about compensatory strategies for shower transfers, and A/E use for LE ADLs. Pt. was able to demonstrate the proper technique for the reacher, and sockaide use. Pt. Reports having family readily available to assist with home management tasks, and meals as needed upon return home. No further follow-up OT services are warranted at this time.      If plan is discharge home, recommend the following:   A little help with bathing/dressing/bathroom     Functional Status Assessment   Patient has had a recent decline in their functional status and demonstrates the ability to make significant improvements in function in a reasonable and predictable amount of time.     Equipment Recommendations    None at this time     Recommendations for Other Services         Precautions/Restrictions   Precautions Precautions: Back Precaution Booklet Issued: No Recall of Precautions/Restrictions: Intact Precaution/Restrictions Comments: Spinal precautions Required Braces or Orthoses:  (Per MD-No brace needed.) Restrictions Weight Bearing Restrictions Per Provider Order: No     Mobility Bed Mobility    Independent                Transfers Overall transfer level: Modified  independent Equipment used: Rolling walker (2 wheels)               General transfer comment: Mobilityb per PT, and Pt. report      Balance                                           ADL either performed or assessed with clinical judgement   ADL Overall ADL's : Independent                                             Vision  Intact/No current vision changes       Perception         Praxis         Pertinent Vitals/Pain Pain Assessment Pain Assessment: No/denies pain Pain Score: 4  Pain Location: surgical site Pain Descriptors / Indicators: Sore Pain Intervention(s): Monitored during session     Extremity/Trunk Assessment Upper Extremity Assessment Upper Extremity Assessment: Overall WFL for tasks assessed   Lower Extremity Assessment Lower Extremity Assessment: Overall WFL for tasks assessed   Cervical / Trunk Assessment Cervical / Trunk Assessment: Back Surgery   Communication Communication Communication: No apparent difficulties   Cognition Arousal: Alert Behavior During Therapy: [facility] for tasks assessed/performed  Following commands: Intact       Cueing  General Comments   Cueing Techniques: Verbal cues      Exercises     Shoulder Instructions      Home Living Family/patient expects to be discharged to:: Private residence Living Arrangements: Spouse/significant other;Children Available Help at Discharge: Family Type of Home: House Home Access: Stairs to enter Secretary/administrator of Steps: 4 Entrance Stairs-Rails: Right;Left Home Layout: Two level Alternate Level Stairs-Number of Steps: flight Alternate Level Stairs-Rails: Right;Left Bathroom Shower/Tub: Chief Strategy Officer: Standard     Home Equipment: None          Prior Functioning/Environment Prior Level of Function : Independent/Modified Independent              Mobility Comments: Very active ADLs Comments: Independent with ADLs, IADLs, working    OT Problem List: Decreased strength   OT Treatment/Interventions: Patient/family education      OT Goals(Current goals can be found in the care plan section)   Acute Rehab OT Goals Patient Stated Goal: To return home/regian independence OT Goal Formulation: With patient Time For Goal Achievement: 12/07/23 Potential to Achieve Goals: Good   OT Frequency:   (Eval only)    Co-evaluation              AM-PAC OT 6 Clicks Daily Activity     Outcome Measure Help from another person eating meals?: None Help from another person taking care of personal grooming?: None Help from another person toileting, which includes using toliet, bedpan, or urinal?: A Little Help from another person bathing (including washing, rinsing, drying)?: A Little Help from another person to put on and taking off regular upper body clothing?: None Help from another person to put on and taking off regular lower body clothing?: A Little 6 Click Score: 21   End of Session    Activity Tolerance: Patient tolerated treatment well Patient left: in bed;with call bell/phone within reach;with bed alarm set  OT Visit Diagnosis: Unsteadiness on feet (R26.81)                Time: 9080-9058 OT Time Calculation (min): 22 min Charges:  OT General Charges $OT Visit: 1 Visit OT Evaluation $OT Eval Low Complexity: 1 Low  Richardson Otter, MS, OTR/L   Richardson Otter 12/07/2023, 12:23 PM

## 2023-12-07 NOTE — Plan of Care (Addendum)
 Pt is alert and oriented x 4. Up with 1 assist with walker. Foley removed at 0000. Pt voided at 0330 150 cc of yellow urine. Vitals stable. Received 2 doses of oxy this shift. 5 cc from Accordian drain.  Problem: Education: Goal: Knowledge of General Education information will improve Description: Including pain rating scale, medication(s)/side effects and non-pharmacologic comfort measures Outcome: Progressing   Problem: Health Behavior/Discharge Planning: Goal: Ability to manage health-related needs will improve Outcome: Progressing   Problem: Clinical Measurements: Goal: Ability to maintain clinical measurements within normal limits will improve Outcome: Progressing Goal: Will remain free from infection Outcome: Progressing Goal: Diagnostic test results will improve Outcome: Progressing Goal: Respiratory complications will improve Outcome: Progressing Goal: Cardiovascular complication will be avoided Outcome: Progressing   Problem: Activity: Goal: Risk for activity intolerance will decrease Outcome: Progressing   Problem: Nutrition: Goal: Adequate nutrition will be maintained Outcome: Progressing   Problem: Coping: Goal: Level of anxiety will decrease Outcome: Progressing   Problem: Elimination: Goal: Will not experience complications related to bowel motility Outcome: Progressing Goal: Will not experience complications related to urinary retention Outcome: Progressing   Problem: Pain Managment: Goal: General experience of comfort will improve and/or be controlled Outcome: Progressing   Problem: Safety: Goal: Ability to remain free from injury will improve Outcome: Progressing   Problem: Skin Integrity: Goal: Risk for impaired skin integrity will decrease Outcome: Progressing   Problem: Education: Goal: Ability to verbalize activity precautions or restrictions will improve Outcome: Progressing Goal: Knowledge of the prescribed therapeutic regimen will  improve Outcome: Progressing Goal: Understanding of discharge needs will improve Outcome: Progressing   Problem: Activity: Goal: Ability to avoid complications of mobility impairment will improve Outcome: Progressing Goal: Ability to tolerate increased activity will improve Outcome: Progressing Goal: Will remain free from falls Outcome: Progressing   Problem: Bowel/Gastric: Goal: Gastrointestinal status for postoperative course will improve Outcome: Progressing   Problem: Clinical Measurements: Goal: Ability to maintain clinical measurements within normal limits will improve Outcome: Progressing Goal: Postoperative complications will be avoided or minimized Outcome: Progressing Goal: Diagnostic test results will improve Outcome: Progressing   Problem: Pain Management: Goal: Pain level will decrease Outcome: Progressing   Problem: Skin Integrity: Goal: Will show signs of wound healing Outcome: Progressing   Problem: Health Behavior/Discharge Planning: Goal: Identification of resources available to assist in meeting health care needs will improve Outcome: Progressing   Problem: Bladder/Genitourinary: Goal: Urinary functional status for postoperative course will improve Outcome: Progressing

## 2023-12-07 NOTE — Progress Notes (Signed)
 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   POD1: doing well, pain improved. Ready for dc  Physical Exam: Vitals:   12/07/23 0403 12/07/23 0820  BP: 128/71 126/72  Pulse: (!) 51 (!) 57  Resp: 18 18  Temp: 98.1 F (36.7 C) 99.3 F (37.4 C)  SpO2: 94% 94%    AA Ox3 CNI  Side Iliopsoas Quads Hamstring PF DF EHL  R 5 5 5 5 5 5   L 5 5 5 5 5 5      Data:  Recent Labs  Lab 12/06/23 0643  NA 137  K 4.1  CL 101  BUN 14  CREATININE 0.90  GLUCOSE 91   No results for input(s): AST, ALT, ALKPHOS in the last 168 hours.  Invalid input(s): TBILI   Recent Labs  Lab 12/06/23 0643  HGB 13.6  HCT 40.0   No results for input(s): APTT, INR in the last 168 hours.      DG Lumbar Spine 2-3 Views Result Date: 12/06/2023 CLINICAL DATA:  Fluoro guidance provided EXAM: LUMBAR SPINE - 2-3 VIEW FINDINGS: Dose: Cumulative Air Kerma 134mG y Fluoro time 63s IMPRESSION: C-arm fluoro guidance provided during lumbosacral fusion. Electronically Signed   By: Fonda Field M.D.   On: 12/06/2023 11:58   DG C-Arm 1-60 Min-No Report Result Date: 12/06/2023 Fluoroscopy was utilized by the requesting physician.  No radiographic interpretation.   DG C-Arm 1-60 Min-No Report Result Date: 12/06/2023 Fluoroscopy was utilized by the requesting physician.  No radiographic interpretation.   DG C-Arm 1-60 Min-No Report Result Date: 12/06/2023 Fluoroscopy was utilized by the requesting physician.  No radiographic interpretation.    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 He is currently 1 Day Post-Op.   - Drains: Removed - mobilize - pain control - DVT prophylaxis - PTOT, likely dc today.   Penne MICAEL Sharps,  MD/MSCR Department of Neurosurgery

## 2023-12-07 NOTE — Plan of Care (Signed)

## 2023-12-07 NOTE — Discharge Summary (Signed)
 [facility] Discharge Summary   Patient: Andrew MARTE, MD FMW:[id number] DOB: July 14, 1980 PCP: Geofm Glade PARAS, MD  Date of admission: 12/06/2023  Date of Surgery: 12/06/2023  Surgery:  L5-S1 MIS TLIF  Date of discharge: 12/07/23 1 Day Post-Op  Discharge Condition: Good  Recommendations at discharge:  Follow up as scheduled  Discharge Diagnoses: Active Problems:   Chronic bilateral low back pain with left-sided sciatica   S/P lumbar fusion  Principal Problem (Resolved):   Pars defect of lumbar spine Resolved Problems:   Pars defect with spondylolisthesis   Hospital Course: The patient underwent L5/S1 MIS TLIF on 12/06/2023 for Pars defect of lumbar spine. They were admitted to the ward postoperatively. On 1 Day Post-Op they were doing well, drain removed, and were deemed safe for discharge.  Consultants: OT Additional Procedures: None  Exam: Vitals:   12/07/23 0820  BP: 126/72  Pulse: (!) 57  Resp: 18  Temp: 99.3 F (37.4 C)  SpO2: 94%   The physical exam is generally normal. Patient appears well, alert and oriented x 3, pleasant, cooperative. Vitals are as noted. Abdomen is soft, no tenderness. Extremities are normal.  Neurological exam is at baseline, no weakness. Incision is dressing applied, no drainage. .   The results of significant diagnostics from this hospitalization (including imaging, microbiology, ancillary and laboratory) are listed below for reference.  Imaging Studies: DG Lumbar Spine 2-3 Views Result Date: 12/06/2023 CLINICAL DATA:  Fluoro guidance provided EXAM: LUMBAR SPINE - 2-3 VIEW FINDINGS: Dose: Cumulative Air Kerma 134mG y Fluoro time 63s IMPRESSION: C-arm fluoro guidance provided during lumbosacral fusion. Electronically Signed   By: Fonda Field M.D.   On: 12/06/2023 11:58   DG C-Arm 1-60 Min-No Report Result Date: 12/06/2023 Fluoroscopy was utilized by the requesting physician.  No radiographic interpretation.   DG C-Arm 1-60 Min-No  Report Result Date: 12/06/2023 Fluoroscopy was utilized by the requesting physician.  No radiographic interpretation.   DG C-Arm 1-60 Min-No Report Result Date: 12/06/2023 Fluoroscopy was utilized by the requesting physician.  No radiographic interpretation.    Microbiology: Results for orders placed or performed during the hospital encounter of 11/22/23  Surgical pcr screen     Status: None   Collection Time: 11/22/23  2:05 PM   Specimen: Nasal Mucosa; Nasal Swab  Result Value Ref Range Status   MRSA, PCR NEGATIVE NEGATIVE Final   Staphylococcus aureus NEGATIVE NEGATIVE Final    Comment: (NOTE) The Xpert SA Assay (FDA approved for NASAL specimens in patients 58 years of age and older), is one component of a comprehensive surveillance program. It is not intended to diagnose infection nor to guide or monitor treatment. Performed at [facility], [address] [place], [place] [address]     Labs:  CBC: Recent Labs  Lab 12/06/23 0643  HGB 13.6  HCT 40.0   Basic Metabolic Panel: Recent Labs  Lab 12/06/23 0643  NA 137  K 4.1  CL 101  GLUCOSE 91  BUN 14  CREATININE 0.90   CBG: No results for input(s): GLUCAP in the last 168 hours.  Time spent: 35 minutes  Author: Penne LELON Sharps, MD  [facility]

## 2023-12-09 ENCOUNTER — Encounter: Payer: Self-pay | Admitting: Internal Medicine

## 2023-12-09 ENCOUNTER — Encounter: Payer: Self-pay | Admitting: Neurosurgery

## 2023-12-09 DIAGNOSIS — Z0000 Encounter for general adult medical examination without abnormal findings: Secondary | ICD-10-CM

## 2023-12-09 DIAGNOSIS — E559 Vitamin D deficiency, unspecified: Secondary | ICD-10-CM | POA: Insufficient documentation

## 2023-12-09 DIAGNOSIS — I10 Essential (primary) hypertension: Secondary | ICD-10-CM

## 2023-12-09 DIAGNOSIS — E782 Mixed hyperlipidemia: Secondary | ICD-10-CM

## 2023-12-11 DIAGNOSIS — F902 Attention-deficit hyperactivity disorder, combined type: Secondary | ICD-10-CM | POA: Diagnosis not present

## 2023-12-11 DIAGNOSIS — Z79899 Other long term (current) drug therapy: Secondary | ICD-10-CM | POA: Diagnosis not present

## 2023-12-13 ENCOUNTER — Encounter: Payer: Self-pay | Admitting: Neurosurgery

## 2023-12-18 ENCOUNTER — Other Ambulatory Visit: Payer: Self-pay | Admitting: Medical Genetics

## 2023-12-18 ENCOUNTER — Ambulatory Visit

## 2023-12-18 DIAGNOSIS — M5442 Lumbago with sciatica, left side: Secondary | ICD-10-CM

## 2023-12-18 DIAGNOSIS — Z981 Arthrodesis status: Secondary | ICD-10-CM

## 2023-12-18 DIAGNOSIS — Z4789 Encounter for other orthopedic aftercare: Secondary | ICD-10-CM | POA: Diagnosis not present

## 2023-12-18 DIAGNOSIS — G8929 Other chronic pain: Secondary | ICD-10-CM | POA: Diagnosis not present

## 2023-12-18 DIAGNOSIS — M4310 Spondylolisthesis, site unspecified: Secondary | ICD-10-CM

## 2023-12-18 NOTE — Progress Notes (Addendum)
   REFERRING PHYSICIAN:  Geofm Glade PARAS, Md [address] [place],  [place] [address]  DOS: 12/06/23 L5-S1 MIS TLIF   HISTORY OF PRESENT ILLNESS: Andrew S Apfel, MD is approximately 2 weeks status post MIS TLIF. he is doing well. He reports some soreness in his left calf with increased activity but is otherwise doing well. He denies any incisional concerns.   PHYSICAL EXAMINATION:  General: Patient is well developed, well nourished, calm, collected, and in no apparent distress.   NEUROLOGICAL:  General: In no acute distress.   Awake, alert, oriented to person, place, and time.  Pupils equal round and reactive to light.    Strength:            Side Iliopsoas Quads Hamstring PF DF EHL  R 5 5 5 5 5 5   L 5 5 5 5 5 5    Incisions c/d/I and healing well.  There is some redness around his staple site.  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 We discussed activity escalation and I have advised the patient to lift up to 10 pounds until 6 weeks after surgery, then increase up to 25 pounds until 12 weeks after surgery.  After 12 weeks post-op, the patient advised to increase activity as tolerated.  He would like to return to work.  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  He can resume work on day 6 tolerated with restrictions after that.  he will follow up with Dr. Clois in 4 weeks with lumbar xrays prior or sooner should he have any questions or concerns. He expressed understanding and was in agreement with this plan of care.  Edsel Goods PA-C Department of neurosurgery

## 2023-12-19 ENCOUNTER — Encounter: Payer: Self-pay | Admitting: Neurosurgery

## 2023-12-19 ENCOUNTER — Other Ambulatory Visit

## 2023-12-19 ENCOUNTER — Ambulatory Visit (INDEPENDENT_AMBULATORY_CARE_PROVIDER_SITE_OTHER): Admitting: Neurosurgery

## 2023-12-19 VITALS — BP 118/76 | Temp 98.5°F | Ht 72.0 in | Wt 248.1 lb

## 2023-12-19 DIAGNOSIS — Z981 Arthrodesis status: Secondary | ICD-10-CM

## 2023-12-19 DIAGNOSIS — M4310 Spondylolisthesis, site unspecified: Secondary | ICD-10-CM

## 2023-12-19 DIAGNOSIS — Z006 Encounter for examination for normal comparison and control in clinical research program: Secondary | ICD-10-CM

## 2023-12-20 ENCOUNTER — Other Ambulatory Visit (INDEPENDENT_AMBULATORY_CARE_PROVIDER_SITE_OTHER)

## 2023-12-20 DIAGNOSIS — E559 Vitamin D deficiency, unspecified: Secondary | ICD-10-CM | POA: Diagnosis not present

## 2023-12-20 DIAGNOSIS — I10 Essential (primary) hypertension: Secondary | ICD-10-CM

## 2023-12-20 DIAGNOSIS — E782 Mixed hyperlipidemia: Secondary | ICD-10-CM | POA: Diagnosis not present

## 2023-12-20 LAB — COMPREHENSIVE METABOLIC PANEL WITH GFR
ALT: 35 U/L (ref 0–53)
AST: 23 U/L (ref 0–37)
Albumin: 4.5 g/dL (ref 3.5–5.2)
Alkaline Phosphatase: 76 U/L (ref 39–117)
BUN: 14 mg/dL (ref 6–23)
CO2: 26 meq/L (ref 19–32)
Calcium: 9.4 mg/dL (ref 8.4–10.5)
Chloride: 99 meq/L (ref 96–112)
Creatinine, Ser: 0.87 mg/dL (ref 0.40–1.50)
GFR: 105.69 mL/min (ref >60.00)
Glucose, Bld: 84 mg/dL (ref 70–99)
Potassium: 3.9 meq/L (ref 3.5–5.1)
Sodium: 133 meq/L — ABNORMAL LOW (ref 135–145)
Total Bilirubin: 1 mg/dL (ref 0.2–1.2)
Total Protein: 7.6 g/dL (ref 6.0–8.3)

## 2023-12-20 LAB — CBC WITH DIFFERENTIAL/PLATELET
Basophils Absolute: 0 K/uL (ref 0.0–0.1)
Basophils Relative: 0.5 % (ref 0.0–3.0)
Eosinophils Absolute: 0.1 K/uL (ref 0.0–0.7)
Eosinophils Relative: 1.6 % (ref 0.0–5.0)
HCT: 40.9 % (ref 39.0–52.0)
Hemoglobin: 14.4 g/dL (ref 13.0–17.0)
Lymphocytes Relative: 36.8 % (ref 12.0–46.0)
Lymphs Abs: 2.4 K/uL (ref 0.7–4.0)
MCHC: 35.1 g/dL (ref 30.0–36.0)
MCV: 85.8 fl (ref 78.0–100.0)
Monocytes Absolute: 0.4 K/uL (ref 0.1–1.0)
Monocytes Relative: 6.7 % (ref 3.0–12.0)
Neutro Abs: 3.5 K/uL (ref 1.4–7.7)
Neutrophils Relative %: 54.4 % (ref 43.0–77.0)
Platelets: 261 K/uL (ref 150.0–400.0)
RBC: 4.76 Mil/uL (ref 4.22–5.81)
RDW: 13.5 % (ref 11.5–15.5)
WBC: 6.4 K/uL (ref 4.0–10.5)

## 2023-12-20 LAB — LIPID PANEL
Cholesterol: 121 mg/dL (ref 0–200)
HDL: 47 mg/dL (ref >39.00)
LDL Cholesterol: 55 mg/dL (ref 0–99)
NonHDL: 74.03
Total CHOL/HDL Ratio: 3
Triglycerides: 95 mg/dL (ref 0.0–149.0)
VLDL: 19 mg/dL (ref 0.0–40.0)

## 2023-12-20 LAB — VITAMIN D 25 HYDROXY (VIT D DEFICIENCY, FRACTURES): VITD: 33.87 ng/mL (ref 30.00–100.00)

## 2023-12-20 LAB — TSH: TSH: 1.57 u[IU]/mL (ref 0.35–5.50)

## 2023-12-24 ENCOUNTER — Encounter: Payer: Self-pay | Admitting: Internal Medicine

## 2023-12-24 ENCOUNTER — Other Ambulatory Visit (HOSPITAL_COMMUNITY): Payer: Self-pay

## 2023-12-24 NOTE — Patient Instructions (Addendum)
 Medications changes include :   None     Return in about 1 year (around 12/24/2024) for Physical Exam.     Health Maintenance, Male Adopting a healthy lifestyle and getting preventive care are important in promoting health and wellness. Ask your health care provider about: The right schedule for you to have regular tests and exams. Things you can do on your own to prevent diseases and keep yourself healthy. What should I know about diet, weight, and exercise? Eat a healthy diet  Eat a diet that includes plenty of vegetables, fruits, low-fat dairy products, and lean protein. Do not eat a lot of foods that are high in solid fats, added sugars, or sodium. Maintain a healthy weight Body mass index (BMI) is a measurement that can be used to identify possible weight problems. It estimates body fat based on height and weight. Your health care provider can help determine your BMI and help you achieve or maintain a healthy weight. Get regular exercise Get regular exercise. This is one of the most important things you can do for your health. Most adults should: Exercise for at least 150 minutes each week. The exercise should increase your heart rate and make you sweat (moderate-intensity exercise). Do strengthening exercises at least twice a week. This is in addition to the moderate-intensity exercise. Spend less time sitting. Even light physical activity can be beneficial. Watch cholesterol and blood lipids Have your blood tested for lipids and cholesterol at 43 years of age, then have this test every 5 years. You may need to have your cholesterol levels checked more often if: Your lipid or cholesterol levels are high. You are older than 43 years of age. You are at high risk for heart disease. What should I know about cancer screening? Many types of cancers can be detected early and may often be prevented. Depending on your health history and family history, you may need to have cancer  screening at various ages. This may include screening for: Colorectal cancer. Prostate cancer. Skin cancer. Lung cancer. What should I know about heart disease, diabetes, and high blood pressure? Blood pressure and heart disease High blood pressure causes heart disease and increases the risk of stroke. This is more likely to develop in people who have high blood pressure readings or are overweight. Talk with your health care provider about your target blood pressure readings. Have your blood pressure checked: Every 3-5 years if you are 104-58 years of age. Every year if you are 33 years old or older. If you are between the ages of 46 and 18 and are a current or former smoker, ask your health care provider if you should have a one-time screening for abdominal aortic aneurysm (AAA). Diabetes Have regular diabetes screenings. This checks your fasting blood sugar level. Have the screening done: Once every three years after age 93 if you are at a normal weight and have a low risk for diabetes. More often and at a younger age if you are overweight or have a high risk for diabetes. What should I know about preventing infection? Hepatitis B If you have a higher risk for hepatitis B, you should be screened for this virus. Talk with your health care provider to find out if you are at risk for hepatitis B infection. Hepatitis C Blood testing is recommended for: Everyone born from [birth year] through 1965. Anyone with known risk factors for hepatitis C. Sexually transmitted infections (STIs) You should be screened each year for  STIs, including gonorrhea and chlamydia, if: You are sexually active and are younger than 43 years of age. You are older than 43 years of age and your health care provider tells you that you are at risk for this type of infection. Your sexual activity has changed since you were last screened, and you are at increased risk for chlamydia or gonorrhea. Ask your health care provider if  you are at risk. Ask your health care provider about whether you are at high risk for HIV. Your health care provider may recommend a prescription medicine to help prevent HIV infection. If you choose to take medicine to prevent HIV, you should first get tested for HIV. You should then be tested every 3 months for as long as you are taking the medicine. Follow these instructions at home: Alcohol use Do not drink alcohol if your health care provider tells you not to drink. If you drink alcohol: Limit how much you have to 0-2 drinks a day. Know how much alcohol is in your drink. In the [place] one drink equals one 12 oz bottle of beer (355 mL), one 5 oz glass of wine (148 mL), or one 1 oz glass of hard liquor (44 mL). Lifestyle Do not use any products that contain nicotine or tobacco. These products include cigarettes, chewing tobacco, and vaping devices, such as e-cigarettes. If you need help quitting, ask your health care provider. Do not use street drugs. Do not share needles. Ask your health care provider for help if you need support or information about quitting drugs. General instructions Schedule regular health, dental, and eye exams. Stay current with your vaccines. Tell your health care provider if: You often feel depressed. You have ever been abused or do not feel safe at home. Summary Adopting a healthy lifestyle and getting preventive care are important in promoting health and wellness. Follow your health care provider's instructions about healthy diet, exercising, and getting tested or screened for diseases. Follow your health care provider's instructions on monitoring your cholesterol and blood pressure. This information is not intended to replace advice given to you by your health care provider. Make sure you discuss any questions you have with your health care provider. Document Revised: 09/12/2020 Document Reviewed: 09/12/2020 Elsevier Patient Education  2024 [employer].

## 2023-12-24 NOTE — Progress Notes (Unsigned)
 Subjective:    Patient ID: Andrew GORMAN Lloyd, MD, male    DOB: December 16, 1980, 43 y.o.   MRN: [id number]     HPI Andrew Gray is here for a physical exam and his chronic medical problems.  Had labs done.   To return to work tomorrow PT  - MIS TLIF 8/1.  Recovering well with some minimal discomfort.  He does have increased fatigue since the surgery.   Medications and allergies reviewed with patient and updated if appropriate.  Current Outpatient Medications on File Prior to Visit  Medication Sig Dispense Refill   acetaminophen  (TYLENOL ) 650 MG CR tablet Take 1,300 mg by mouth every 8 (eight) hours as needed for pain.     betamethasone  valerate (VALISONE ) 0.1 % cream Apply 1 Application topically daily. (Patient taking differently: Apply 1 Application topically daily as needed (eczma).) 45 g 0   cetirizine (ZYRTEC) 10 MG tablet Take 10 mg by mouth daily.     desonide  (DESOWEN ) 0.05 % ointment Apply 1 Application topically to eyelids  (two) times daily for 7 days. (Patient taking differently: Apply 1 Application topically 2 (two) times daily as needed (eczmea around eyelids).) 30 g 0   Febuxostat  80 MG TABS Take 1 tablet (80 mg total) by mouth daily. 90 tablet 3   FLUoxetine  (PROZAC ) 20 MG capsule Take 1 capsule (20 mg total) by mouth daily. 30 capsule 5   gabapentin  (NEURONTIN ) 300 MG capsule Take 1 capsule (300 mg total) by mouth at bedtime. (Patient taking differently: Take 300 mg by mouth 3 (three) times daily as needed (pain).) 90 capsule 0   hydrochlorothiazide  (HYDRODIURIL ) 25 MG tablet Take 1 tablet (25 mg total) by mouth daily. 90 tablet 3   lisdexamfetamine (VYVANSE ) 20 MG capsule Take 1 capsule (20 mg total) by mouth daily. 30 capsule 0   lisinopril  (ZESTRIL ) 10 MG tablet Take 1 tablet (10 mg total) by mouth daily. 90 tablet 3   pantoprazole  (PROTONIX ) 40 MG tablet Take 1 tablet by mouth daily. 90 tablet 3   rosuvastatin  (CRESTOR ) 5 MG tablet Take 1 tablet (5 mg total) by mouth daily. 90  tablet 3   tirzepatide  (ZEPBOUND ) 5 MG/0.5ML Pen Inject 5 mg into the skin every Thursday.     traZODone  (DESYREL ) 50 MG tablet Take 1 to 2 tablets by mouth at bedtime. (Patient taking differently: Take 50 mg by mouth at bedtime as needed for sleep.) 60 tablet 3   Vitamin D , Ergocalciferol , (DRISDOL ) 1.25 MG (50000 UNIT) CAPS capsule Take 1 capsule (50,000 Units total) by mouth once a week. 12 capsule 1   No current facility-administered medications on file prior to visit.    Review of Systems  Constitutional:  Negative for fever.  Eyes:  Negative for visual disturbance.  Respiratory:  Negative for cough, shortness of breath and wheezing.   Cardiovascular:  Negative for chest pain, palpitations and leg swelling.  Gastrointestinal:  Negative for abdominal pain, blood in stool, constipation and diarrhea.       No gerd  Genitourinary:  Negative for difficulty urinating, dysuria and hematuria.  Musculoskeletal:  Positive for back pain (Minimal since surgery). Negative for arthralgias.  Skin:  Negative for rash.  Neurological:  Negative for light-headedness and headaches.  Psychiatric/Behavioral:  Negative for dysphoric mood. The patient is not nervous/anxious.        Objective:   Vitals:   12/25/23 1310  BP: 116/80  Pulse: 68  Temp: 97.8 F (36.6 C)  SpO2: 98%  Filed Weights   12/25/23 1310  Weight: 244 lb 6.4 oz (110.9 kg)   Body mass index is 33.15 kg/m.  BP Readings from Last 3 Encounters:  12/25/23 116/80  12/19/23 118/76  12/07/23 126/72    Wt Readings from Last 3 Encounters:  12/25/23 244 lb 6.4 oz (110.9 kg)  12/19/23 248 lb 2 oz (112.5 kg)  11/22/23 247 lb 12.8 oz (112.4 kg)      Physical Exam Constitutional: He appears well-developed and well-nourished. No distress.  HENT:  Head: Normocephalic and atraumatic.  Right Ear: External ear normal.  Left Ear: External ear normal.  Normal ear canals and TM b/l  Mouth/Throat: Oropharynx is clear and  moist. Eyes: Conjunctivae and EOM are normal.  Neck: Neck supple. No tracheal deviation present. No thyromegaly present.  No carotid bruit  Cardiovascular: Normal rate, regular rhythm, normal heart sounds and intact distal pulses.   No murmur heard.  No lower extremity edema. Pulmonary/Chest: Effort normal and breath sounds normal. No respiratory distress. He has no wheezes. He has no rales.  Abdominal: Soft. He exhibits no distension. There is no tenderness.  Genitourinary: deferred  Lymphadenopathy:   He has no cervical adenopathy.  Skin: Skin is warm and dry. He is not diaphoretic.  Psychiatric: He has a normal mood and affect. His behavior is normal.         Assessment & Plan:   Physical exam: Screening blood work  ordered Exercise   currently not able to exercise since surgery-Will return when able Weight overweight Substance abuse   none   Reviewed recommended immunizations.   Health Maintenance  Topic Date Due   HPV VACCINES (1 - 3-dose SCDM series) Never done   INFLUENZA VACCINE  12/06/2023   COVID-19 Vaccine (5 - 2024-25 season) 01/09/2024 (Originally 01/06/2023)   DTaP/Tdap/Td (4 - Td or Tdap) 08/17/2027   Colonoscopy  07/14/2028   Hepatitis C Screening  Completed   Pneumococcal Vaccine  Aged Out   Meningococcal B Vaccine  Aged Out   Hepatitis B Vaccines 19-59 Average Risk  Discontinued   HIV Screening  Discontinued     See Problem List for Assessment and Plan of chronic medical problems.

## 2023-12-25 ENCOUNTER — Encounter: Payer: Self-pay | Admitting: Internal Medicine

## 2023-12-25 ENCOUNTER — Ambulatory Visit (INDEPENDENT_AMBULATORY_CARE_PROVIDER_SITE_OTHER): Admitting: Internal Medicine

## 2023-12-25 VITALS — BP 116/80 | HR 68 | Temp 97.8°F | Ht 72.0 in | Wt 244.4 lb

## 2023-12-25 DIAGNOSIS — M1A0790 Idiopathic chronic gout, unspecified ankle and foot, without tophus (tophi): Secondary | ICD-10-CM | POA: Diagnosis not present

## 2023-12-25 DIAGNOSIS — F419 Anxiety disorder, unspecified: Secondary | ICD-10-CM

## 2023-12-25 DIAGNOSIS — I10 Essential (primary) hypertension: Secondary | ICD-10-CM

## 2023-12-25 DIAGNOSIS — E559 Vitamin D deficiency, unspecified: Secondary | ICD-10-CM

## 2023-12-25 DIAGNOSIS — E782 Mixed hyperlipidemia: Secondary | ICD-10-CM

## 2023-12-25 DIAGNOSIS — Z981 Arthrodesis status: Secondary | ICD-10-CM | POA: Diagnosis not present

## 2023-12-25 DIAGNOSIS — G4733 Obstructive sleep apnea (adult) (pediatric): Secondary | ICD-10-CM

## 2023-12-25 DIAGNOSIS — K760 Fatty (change of) liver, not elsewhere classified: Secondary | ICD-10-CM

## 2023-12-25 DIAGNOSIS — K219 Gastro-esophageal reflux disease without esophagitis: Secondary | ICD-10-CM

## 2023-12-25 DIAGNOSIS — Z0000 Encounter for general adult medical examination without abnormal findings: Secondary | ICD-10-CM | POA: Diagnosis not present

## 2023-12-25 NOTE — Assessment & Plan Note (Signed)
 Chronic No recent gout flares On uloric  daily

## 2023-12-25 NOTE — Assessment & Plan Note (Signed)
 S/p minimally invasive transforaminal lumbar interbody fusion earlier this month Recovering well Following with neurosurgery

## 2023-12-25 NOTE — Assessment & Plan Note (Signed)
 Chronic Taking vitamin D  supplementation Last vitamin D  Lab Results  Component Value Date   VD25OH 33.87 12/20/2023

## 2023-12-25 NOTE — Assessment & Plan Note (Signed)
 Chronic GERD controlled Continue pantoprazole 40 mg daily

## 2023-12-25 NOTE — Assessment & Plan Note (Signed)
 Chronic Lab Results  Component Value Date   LDLCALC 55 12/20/2023   Well-controlled Continue rosuvastatin  5 mg daily

## 2023-12-25 NOTE — Assessment & Plan Note (Signed)
 Chronic Blood pressure well controlled CMP reviewed Continue lisinopril  10 mg daily, HCTZ 25 mg daily

## 2023-12-25 NOTE — Assessment & Plan Note (Signed)
 Chronic Working on weight loss On Zepbound 

## 2023-12-25 NOTE — Assessment & Plan Note (Signed)
Chronic On CPAP

## 2023-12-25 NOTE — Assessment & Plan Note (Signed)
 Chronic Controlled Continue fluoxetine  20 mg daily

## 2023-12-27 ENCOUNTER — Encounter: Payer: Self-pay | Admitting: Neurosurgery

## 2023-12-27 LAB — GENECONNECT MOLECULAR SCREEN: Genetic Analysis Overall Interpretation: NEGATIVE

## 2023-12-27 NOTE — Telephone Encounter (Signed)
 DOS: 12/06/23 L5-S1 MIS TLIF

## 2023-12-30 ENCOUNTER — Encounter: Payer: Self-pay | Admitting: Neurosurgery

## 2024-01-07 ENCOUNTER — Other Ambulatory Visit (HOSPITAL_COMMUNITY): Payer: Self-pay

## 2024-01-07 ENCOUNTER — Other Ambulatory Visit: Payer: Self-pay | Admitting: Internal Medicine

## 2024-01-07 DIAGNOSIS — I10 Essential (primary) hypertension: Secondary | ICD-10-CM

## 2024-01-07 MED ORDER — HYDROCHLOROTHIAZIDE 25 MG PO TABS
25.0000 mg | ORAL_TABLET | Freq: Every day | ORAL | 3 refills | Status: AC
  Filled 2024-01-07: qty 90, 90d supply, fill #0
  Filled 2024-04-16: qty 90, 90d supply, fill #1

## 2024-01-08 ENCOUNTER — Other Ambulatory Visit (HOSPITAL_COMMUNITY): Payer: Self-pay

## 2024-01-08 MED ORDER — DOXYCYCLINE HYCLATE 100 MG PO TABS
100.0000 mg | ORAL_TABLET | Freq: Two times a day (BID) | ORAL | 0 refills | Status: AC
  Filled 2024-01-08 (×2): qty 14, 7d supply, fill #0

## 2024-01-13 ENCOUNTER — Ambulatory Visit

## 2024-01-13 DIAGNOSIS — M9963 Osseous and subluxation stenosis of intervertebral foramina of lumbar region: Secondary | ICD-10-CM | POA: Diagnosis not present

## 2024-01-13 DIAGNOSIS — Z981 Arthrodesis status: Secondary | ICD-10-CM

## 2024-01-13 DIAGNOSIS — M4310 Spondylolisthesis, site unspecified: Secondary | ICD-10-CM | POA: Diagnosis not present

## 2024-01-13 DIAGNOSIS — M47816 Spondylosis without myelopathy or radiculopathy, lumbar region: Secondary | ICD-10-CM | POA: Diagnosis not present

## 2024-01-13 DIAGNOSIS — M5442 Lumbago with sciatica, left side: Secondary | ICD-10-CM | POA: Diagnosis not present

## 2024-01-13 DIAGNOSIS — G8929 Other chronic pain: Secondary | ICD-10-CM

## 2024-01-13 DIAGNOSIS — M4317 Spondylolisthesis, lumbosacral region: Secondary | ICD-10-CM | POA: Diagnosis not present

## 2024-01-14 ENCOUNTER — Encounter: Payer: Self-pay | Admitting: Internal Medicine

## 2024-01-14 ENCOUNTER — Ambulatory Visit (INDEPENDENT_AMBULATORY_CARE_PROVIDER_SITE_OTHER): Admitting: Neurosurgery

## 2024-01-14 VITALS — BP 118/72 | Temp 97.7°F | Ht 72.0 in | Wt 246.0 lb

## 2024-01-14 DIAGNOSIS — M4310 Spondylolisthesis, site unspecified: Secondary | ICD-10-CM

## 2024-01-14 DIAGNOSIS — Z981 Arthrodesis status: Secondary | ICD-10-CM

## 2024-01-14 NOTE — Progress Notes (Signed)
   REFERRING PHYSICIAN:  Geofm Glade PARAS, Md [address] [place],  [place] [address]  DOS: 12/06/23 L5-S1 MIS TLIF   HISTORY OF PRESENT ILLNESS: Andrew S Mandler, MD is status post MIS TLIF. he is doing well.   Overall he is doing extremely well compared to before surgery.  PHYSICAL EXAMINATION:  General: Patient is well developed, well nourished, calm, collected, and in no apparent distress.   NEUROLOGICAL:  General: In no acute distress.   Awake, alert, oriented to person, place, and time.  Pupils equal round and reactive to light.    Strength:            Side Iliopsoas Quads Hamstring PF DF EHL  R 5 5 5 5 5 5   L 5 5 5 5 5 5    Incisions c/d/I and healing well.   ROS (Neurologic):  Negative except as noted above  IMAGING: 12/18/23 Lumbar xrays with evidence of hardware malfunction  X-rays yesterday are stable.  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  I am very pleased with his recovery.    We discussed increasing his activity.  He will slowly begin increasing his activity.  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  Will see him back in 6 weeks.  Reeves Daisy Department of neurosurgery

## 2024-01-25 ENCOUNTER — Other Ambulatory Visit (HOSPITAL_COMMUNITY): Payer: Self-pay

## 2024-01-25 ENCOUNTER — Other Ambulatory Visit: Payer: Self-pay | Admitting: Family Medicine

## 2024-01-27 ENCOUNTER — Other Ambulatory Visit: Payer: Self-pay

## 2024-01-28 ENCOUNTER — Other Ambulatory Visit (HOSPITAL_COMMUNITY): Payer: Self-pay

## 2024-01-29 ENCOUNTER — Other Ambulatory Visit (HOSPITAL_COMMUNITY): Payer: Self-pay

## 2024-01-29 ENCOUNTER — Other Ambulatory Visit: Payer: Self-pay

## 2024-01-29 MED ORDER — LISDEXAMFETAMINE DIMESYLATE 20 MG PO CAPS
20.0000 mg | ORAL_CAPSULE | Freq: Every day | ORAL | 0 refills | Status: DC
  Filled 2024-01-29 – 2024-01-30 (×2): qty 30, 30d supply, fill #0

## 2024-01-30 ENCOUNTER — Other Ambulatory Visit: Payer: Self-pay

## 2024-01-30 ENCOUNTER — Other Ambulatory Visit (HOSPITAL_COMMUNITY): Payer: Self-pay

## 2024-01-31 ENCOUNTER — Other Ambulatory Visit (HOSPITAL_COMMUNITY): Payer: Self-pay

## 2024-02-03 ENCOUNTER — Other Ambulatory Visit (HOSPITAL_COMMUNITY): Payer: Self-pay

## 2024-02-04 ENCOUNTER — Other Ambulatory Visit (HOSPITAL_COMMUNITY): Payer: Self-pay

## 2024-02-06 ENCOUNTER — Other Ambulatory Visit: Payer: Self-pay

## 2024-02-06 ENCOUNTER — Other Ambulatory Visit: Payer: Self-pay | Admitting: Family Medicine

## 2024-02-06 ENCOUNTER — Other Ambulatory Visit (HOSPITAL_COMMUNITY): Payer: Self-pay

## 2024-02-06 MED ORDER — DOXYCYCLINE HYCLATE 100 MG PO TABS
100.0000 mg | ORAL_TABLET | Freq: Two times a day (BID) | ORAL | 0 refills | Status: AC
  Filled 2024-02-06: qty 14, 7d supply, fill #0

## 2024-02-06 NOTE — Progress Notes (Signed)
 Carbuncle at hairline on the back of her neck.  Mild potential infectious etiology.  Failed doxycycline .

## 2024-02-07 ENCOUNTER — Other Ambulatory Visit (HOSPITAL_COMMUNITY): Payer: Self-pay

## 2024-02-11 ENCOUNTER — Encounter: Payer: Self-pay | Admitting: Neurosurgery

## 2024-02-11 ENCOUNTER — Encounter (HOSPITAL_COMMUNITY): Payer: Self-pay

## 2024-02-11 ENCOUNTER — Other Ambulatory Visit (HOSPITAL_COMMUNITY): Payer: Self-pay

## 2024-02-11 DIAGNOSIS — M4310 Spondylolisthesis, site unspecified: Secondary | ICD-10-CM

## 2024-02-11 DIAGNOSIS — Z981 Arthrodesis status: Secondary | ICD-10-CM

## 2024-02-11 NOTE — Telephone Encounter (Signed)
 Xray ordered. Sent chat to Edsel to see if we need to reschedule appointment due to template change. Awaiting response.

## 2024-02-15 ENCOUNTER — Other Ambulatory Visit (HOSPITAL_COMMUNITY): Payer: Self-pay

## 2024-02-17 ENCOUNTER — Other Ambulatory Visit: Payer: Self-pay

## 2024-02-22 ENCOUNTER — Other Ambulatory Visit: Payer: Self-pay | Admitting: Internal Medicine

## 2024-02-22 ENCOUNTER — Other Ambulatory Visit (HOSPITAL_COMMUNITY): Payer: Self-pay

## 2024-02-22 ENCOUNTER — Encounter: Payer: Self-pay | Admitting: Internal Medicine

## 2024-02-23 MED ORDER — ROSUVASTATIN CALCIUM 5 MG PO TABS
5.0000 mg | ORAL_TABLET | Freq: Every day | ORAL | 3 refills | Status: AC
  Filled 2024-02-23: qty 90, 90d supply, fill #0
  Filled 2024-05-20: qty 90, 90d supply, fill #1

## 2024-02-24 ENCOUNTER — Other Ambulatory Visit (HOSPITAL_COMMUNITY): Payer: Self-pay

## 2024-02-24 ENCOUNTER — Other Ambulatory Visit: Payer: Self-pay

## 2024-02-25 ENCOUNTER — Other Ambulatory Visit (HOSPITAL_COMMUNITY): Payer: Self-pay

## 2024-02-25 ENCOUNTER — Other Ambulatory Visit: Payer: Self-pay

## 2024-02-25 MED ORDER — FLUOXETINE HCL 20 MG PO CAPS
20.0000 mg | ORAL_CAPSULE | Freq: Every day | ORAL | 1 refills | Status: AC
  Filled 2024-03-28: qty 90, 90d supply, fill #0
  Filled 2024-03-31: qty 7, 7d supply, fill #1

## 2024-02-27 ENCOUNTER — Other Ambulatory Visit

## 2024-02-27 ENCOUNTER — Encounter: Admitting: Neurosurgery

## 2024-03-03 ENCOUNTER — Telehealth: Payer: Self-pay | Admitting: Neurosurgery

## 2024-03-03 NOTE — Telephone Encounter (Signed)
 Pt is wanting to know if they can switch their tele appt to 8:30 instead, due to him having patients @ 9:30?

## 2024-03-04 NOTE — Progress Notes (Unsigned)
 Neurosurgery Telephone (Audio-Only) Note  Requesting Provider     Geofm Glade PARAS, MD [address] [place],  [place] [address] T: [phone] F: [phone]  Primary Care Provider Geofm Glade PARAS, MD [address] [place] [address] T: [phone] F: [phone]  Telehealth visit was conducted with Artist GORMAN Lloyd, MD, a 43 y.o. male via telephone.  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 He is doing well and has increased his activity since his last follow up. He does notice some left sided low back pain and some nerve type pain into his left calf with increased activity but states this improves with Celebrex  and rest. He is largely improved from pre-op and is pleased with his recovery thus far.   General Review of Systems:  A ROS was performed including pertinent positive and negatives as documented.  All other systems are negative.   Prior to Admission medications   Medication Sig Start Date End Date Taking? Authorizing Provider  acetaminophen  (TYLENOL ) 650 MG CR tablet Take 1,300 mg by mouth every 8 (eight) hours as needed for pain.    [provider]  betamethasone  valerate (VALISONE ) 0.1 % cream Apply 1 Application topically daily. Patient taking differently: Apply 1 Application topically daily as needed (eczma). 12/28/22     cetirizine (ZYRTEC) 10 MG tablet Take 10 mg by mouth daily.    [provider]  desonide  (DESOWEN ) 0.05 % ointment Apply 1 Application topically to eyelids  (two) times daily for 7 days. Patient taking differently: Apply 1 Application topically 2 (two) times daily as needed (eczmea around eyelids). 12/28/22     Febuxostat  80 MG TABS Take 1 tablet (80 mg total) by mouth daily. 11/18/23 11/17/24  Geofm Glade PARAS, MD  FLUoxetine  (PROZAC ) 20 MG capsule Take 1 capsule (20 mg total) by mouth daily. 02/25/24    Geofm Glade PARAS, MD  gabapentin  (NEURONTIN ) 300 MG capsule Take 1 capsule (300 mg total) by mouth at bedtime. Patient taking differently: Take 300 mg by mouth 3 (three) times daily as needed (pain). 10/08/23   Claudene Arthea HERO, DO  hydrochlorothiazide  (HYDRODIURIL ) 25 MG tablet Take 1 tablet (25 mg total) by mouth daily. 01/07/24 01/06/25  Geofm Glade PARAS, MD  lisdexamfetamine (VYVANSE ) 20 MG capsule Take 1 capsule (20 mg total) by mouth daily. 01/29/24     lisinopril  (ZESTRIL ) 10 MG tablet Take 1 tablet (10 mg total) by mouth daily. 11/25/23   Geofm Glade PARAS, MD  pantoprazole  (PROTONIX ) 40 MG tablet Take 1 tablet by mouth daily. 11/18/23 11/17/24  Geofm Glade PARAS, MD  rosuvastatin  (CRESTOR ) 5 MG tablet Take 1 tablet (5 mg total) by mouth daily. 02/23/24   Geofm Glade PARAS, MD  tirzepatide  (ZEPBOUND ) 5 MG/0.5ML Pen Inject 5 mg into the skin every Thursday.    [provider]  traZODone  (DESYREL ) 50 MG tablet Take 1 to 2 tablets by mouth at bedtime. Patient taking differently: Take 50 mg by mouth at bedtime as needed for sleep. 01/15/22     Vitamin D , Ergocalciferol , (DRISDOL ) 1.25 MG (50000 UNIT) CAPS capsule Take 1 capsule (50,000 Units total) by mouth once a week. 11/18/23       DATA REVIEWED    Imaging Studies  03/05/24 Lumbar xrays Without evidence of complication. 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 I will We discussed resuming activities as tolerated at this time. We will see him back in 3 months with lumbar xrays prior. He was encouraged to call the office in the interim with any questions or concerns   No orders of the defined types were placed in this encounter.  DISPOSITION  Follow up: In person appointment in 3 months  Xzavion Doswell Jama Goods, PA Neurosurgery   TELEPHONE DOCUMENTATION   This visit was  performed via telephone.  Patient location: home Provider location: office  I spent a total of 4 minutes non-face-to-face activities for this visit on the date of this encounter including review of current clinical condition and response to treatment.  The patient is aware of and accepts the limits of this telehealth visit.

## 2024-03-05 ENCOUNTER — Ambulatory Visit (INDEPENDENT_AMBULATORY_CARE_PROVIDER_SITE_OTHER): Admitting: Neurosurgery

## 2024-03-05 ENCOUNTER — Ambulatory Visit

## 2024-03-05 ENCOUNTER — Other Ambulatory Visit: Payer: Self-pay

## 2024-03-05 DIAGNOSIS — Z981 Arthrodesis status: Secondary | ICD-10-CM

## 2024-03-05 DIAGNOSIS — G8929 Other chronic pain: Secondary | ICD-10-CM

## 2024-03-05 DIAGNOSIS — M5442 Lumbago with sciatica, left side: Secondary | ICD-10-CM

## 2024-03-05 DIAGNOSIS — M4310 Spondylolisthesis, site unspecified: Secondary | ICD-10-CM | POA: Diagnosis not present

## 2024-03-05 DIAGNOSIS — M4317 Spondylolisthesis, lumbosacral region: Secondary | ICD-10-CM | POA: Diagnosis not present

## 2024-03-05 MED ORDER — CELECOXIB 200 MG PO CAPS
200.0000 mg | ORAL_CAPSULE | Freq: Two times a day (BID) | ORAL | 2 refills | Status: AC
  Filled 2024-03-05: qty 60, 30d supply, fill #0

## 2024-03-09 ENCOUNTER — Encounter: Payer: Self-pay | Admitting: Radiology

## 2024-03-09 ENCOUNTER — Other Ambulatory Visit (HOSPITAL_COMMUNITY): Payer: Self-pay

## 2024-03-09 MED ORDER — LISDEXAMFETAMINE DIMESYLATE 20 MG PO CAPS
20.0000 mg | ORAL_CAPSULE | Freq: Every day | ORAL | 0 refills | Status: DC
  Filled 2024-03-09: qty 30, 30d supply, fill #0

## 2024-03-27 ENCOUNTER — Other Ambulatory Visit: Payer: Self-pay

## 2024-03-27 ENCOUNTER — Encounter: Payer: Self-pay | Admitting: Physical Therapy

## 2024-03-27 ENCOUNTER — Ambulatory Visit: Admitting: Physical Therapy

## 2024-03-27 DIAGNOSIS — M5459 Other low back pain: Secondary | ICD-10-CM

## 2024-03-27 DIAGNOSIS — M6281 Muscle weakness (generalized): Secondary | ICD-10-CM | POA: Diagnosis not present

## 2024-03-27 DIAGNOSIS — G4733 Obstructive sleep apnea (adult) (pediatric): Secondary | ICD-10-CM

## 2024-03-27 MED ORDER — TIRZEPATIDE-WEIGHT MANAGEMENT 15 MG/0.5ML ~~LOC~~ SOAJ: 15.0000 mg | SUBCUTANEOUS | 1 refills | Status: DC

## 2024-03-27 NOTE — Telephone Encounter (Signed)
 Pt has called in today and is requesting a refill on Zepbound  15mg . He uses [employer] [facility]. I have this pending your approval. Pt has an appt scheduled with you on 04/17/2024.

## 2024-03-27 NOTE — Telephone Encounter (Signed)
 This refill request was meant to be sent to Dr Geni Shutter. Sent to PCP by mistake.

## 2024-03-27 NOTE — Therapy (Signed)
 OUTPATIENT PHYSICAL THERAPY EVALUATION   Patient Name: Andrew NEENAN, MD MRN: [id number] DOB:01-09-1981, 43 y.o., male Today's Date: 03/27/2024   END OF SESSION:  PT End of Session - 03/27/24 1107     Visit Number 1    Number of Visits 4    Date for Recertification  05/22/24    Authorization Type [facility] [employer]    PT Start Time [phone]    PT Stop Time 0835    PT Time Calculation (min) 45 min    Activity Tolerance Patient tolerated treatment well    Behavior During Therapy [facility] for tasks assessed/performed          Past Medical History:  Diagnosis Date   ADHD    Anxiety    Arthritis    Back pain    Complication of anesthesia    a.) PONV   Erectile dysfunction    a.) on PDE5i (tadalafil ) PRN   Fatty liver    GERD (gastroesophageal reflux disease)    Gout    Hypertension    Lactose intolerance    OSA (obstructive sleep apnea)    Pars defect with spondylolisthesis    PONV (postoperative nausea and vomiting)    S/P lumbar fusion 12/06/2023        Seasonal allergies    Past Surgical History:  Procedure Laterality Date   APPLICATION OF INTRAOPERATIVE CT SCAN N/A 12/06/2023   Procedure: APPLICATION OF INTRAOPERATIVE CT SCAN;  Surgeon: Clois Fret, MD;  Location: [facility] ORS;  Service: Neurosurgery;  Laterality: N/A;   bone marrow donation     COLONOSCOPY     ELBOW SURGERY Left    KNEE SURGERY     TRANSFORAMINAL LUMBAR INTERBODY FUSION W/ MIS 1 LEVEL N/A 12/06/2023   Procedure: MINIMALLY INVASIVE (MIS) TRANSFORAMINAL LUMBAR INTERBODY FUSION (TLIF) 1 LEVEL;  Surgeon: Clois Fret, MD;  Location: [facility] OR[facility];  Service: Neurosurgery;  Laterality: N/A;  LEFT L5-S1 MINIMALLY INVASIVE (MIS) TRANSFORAMINAL LUMBAR INTERBODY FUSION (TLIF)   VASECTOMY     WISDOM TOOTH EXTRACTION     Patient Active Problem List   Diagnosis Date Noted   Vitamin D  deficiency 12/09/2023   Chronic bilateral low back pain with left-sided sciatica 12/06/2023   S/P lumbar fusion 12/06/2023   History  of colon polyps 07/22/2023   Anxiety 05/31/2023   Hemorrhoid prolapse 02/14/2023   Rash 11/12/2022   GERD (gastroesophageal reflux disease) 11/11/2022   Hepatic steatosis 04/08/2020   Obesity (BMI 30-39.9) 03/01/2020   OSA (obstructive sleep apnea) 12/30/2019   Right hip osteoarthritis and labral tearing 01/23/2018   Rosacea 10/11/2017   Adult ADHD 10/11/2017   Hyperlipidemia 02/23/2015   Essential hypertension, benign 02/23/2015   Gout 02/16/2015   Spondylolisthesis at L5-S1 level 02/15/2015    PCP: Gregory Edsel Ruth, PA  REFERRING PROVIDER: Gregory Edsel Ruth, PA  REFERRING DIAG: S/P lumbar fusion; Chronic bilateral low back pain with left-sided sciatica  Rationale for Evaluation and Treatment: Rehabilitation  THERAPY DIAG:  Other low back pain  Muscle weakness (generalized)  ONSET DATE: 12/06/2023 (DOS)   SUBJECTIVE:       SUBJECTIVE STATEMENT: Patient reports L5-S1 fusion on 12/06/2023. Currently he notes soreness of left lower back with occasional radicular paraesthesia to left lateral calf primarily with activity. He is walking on incline treadmill, is starting to do some weight lifting consisting of squats, and upright exercise bike. He did stir up his symptoms a few weeks ago with walking on incline treadmill at a higher speed. Symptoms tend to  present with lumbar extension based movements. Denies any limitation with usual daily activities or work related activities.   PERTINENT HISTORY:  L5-S1 TLIF  PAIN:  Are you having pain? Yes:  NPRS scale: 0-1/10 at rest, 2/10 at worst over past week Pain location: Left low back and left calf Pain description: Left calf paraesthesia, left low back soreness Aggravating factors: Extension based movements, exercise/activity above normal range Relieving factors: Rest, positional change, medication occasionally  PRECAUTIONS: None  RED FLAGS: None   WEIGHT BEARING RESTRICTIONS: No  FALLS:  Has patient fallen in last 6  months? No  OCCUPATION: MD  PLOF: Independent  PATIENT GOALS: Learn exercises to manage/improve symptoms and strengthen core to return to prior level of function   OBJECTIVE:  Note: Objective measures were completed at Evaluation unless otherwise noted. PATIENT SURVEYS:  Not assessed at eval  COGNITION: Overall cognitive status: Within functional limits for tasks assessed     SENSATION: WFL  MUSCLE LENGTH: Slight limitation with hamstring length bilaterally, left hip flexor/quad length limitation  POSTURE:   Grossly WFL  PALPATION: Tender to palpation left lower lumbar paraspinals, left QL  LUMBAR ROM:   AROM eval  Flexion WFL  Extension 50% - reports stiffness midline lumbar  Right lateral flexion WFL  Left lateral flexion WFL - reports left sided lumbar tightness  Right rotation WFL  Left rotation 75% - reports left sided lumbar tightness   (Blank rows = not tested)  LOWER EXTREMITY ROM:    Hip PROM grossly WFL except limitation bilaterally into hip IR  LOWER EXTREMITY MMT:    MMT Right eval Left eval  Hip flexion 5 5  Hip extension 4 4-  Hip abduction 4+ 4  Hip adduction    Hip internal rotation    Hip external rotation    Knee flexion    Knee extension    Ankle dorsiflexion    Ankle plantarflexion    Ankle inversion    Ankle eversion     (Blank rows = not tested)  FUNCTIONAL TESTS:  DLLT: loss of lumbar control between 45-30 deg, no pain reported Hooklying pelvic tilts: able to perform correctly with good coordination  GAIT: Assistive device utilized: None Level of assistance: Complete Independence Comments: Grossly WFL   TREATMENT [facility] Adult PT Treatment:                                                DATE: 03/27/2024 Side clamshell with green SL bridge with opposite knee to chest 90-90 alternating heel tap 1/2 kneeling hip flexor stretch  Discussed exercises being performed at gym/previous hip stretching and incorporating new HEP as  corrective exercises to be performed along side current weight lifting/strengthening. Discussed role of core/glutes in lumbopelvic control with activities such as incline walking, lifting, etc.   PATIENT EDUCATION:  Education details: Exam findings, POC, HEP Person educated: Patient Education method: Explanation, Demonstration, Tactile cues, Verbal cues, and Handouts Education comprehension: verbalized understanding, returned demonstration, verbal cues required, tactile cues required, and needs further education  HOME EXERCISE PROGRAM: Access Code: [email or web address]    ASSESSMENT: CLINICAL IMPRESSION: Patient is a 43 y.o. male who was seen today for physical therapy evaluation and treatment for left sided lower back pain and left lateral calf paraesthesias following L5-S1 fusion on 12/06/2023. This has been a chronic problem primarily affecting the left hip. He  does exhibit limitations in lumbar motion with muscular tightness of left hip flexor and tenderness noted left QL region. He exhibits gross strength limitation of the left glute and fatigues with core stabilization tasks affecting lumbopelvic control. He was provided exercises to address left glute strength and core/lumbopelvic stabilization, as well as stretch for the hip flexor with core activation.   OBJECTIVE IMPAIRMENTS: decreased activity tolerance, decreased ROM, decreased strength, impaired flexibility, and pain.   ACTIVITY LIMITATIONS: carrying, lifting, and squatting  PARTICIPATION LIMITATIONS: Gym exercise  PERSONAL FACTORS: Fitness, Past/current experiences, and Time since onset of injury/illness/exacerbation are also affecting patient's functional outcome.   REHAB POTENTIAL: Good  CLINICAL DECISION MAKING: Stable/uncomplicated  EVALUATION COMPLEXITY: Low   GOALS: Goals reviewed with patient? Yes  SHORT TERM GOALS: Target date: 04/24/2024  Patient will be I with initial HEP in order to progress with therapy. Baseline: HEP  provided at eval Goal status: INITIAL  2.  Patient will report left lower back soreness </= 1/10 with gym exercise in order to progress strength and aerobic capacity Baseline: reports pain at worst 2/10 Goal status: INITIAL  LONG TERM GOALS: Target date: 05/22/2024  Patient will be I with final HEP to maintain progress from PT. Baseline: HEP provided at eval Goal status: INITIAL  2.  Patient will demonstrate glute strength >/= 4+/5 MMT in order to improve his lumbopelvic control with activities such as walking on incline treadmill without pain exacerbation Baseline: see limitations above Goal status: INITIAL  3.  Patient will demonstrate DLLT < 30 deg without loss of lumbar control in order to improve his lumbopelvic control with weight lifting at gym including squatting and lifting without pain exacerbation Baseline: loss of lumbar control between 45-30 deg Goal status: INITIAL  4.  Patient will perform all lumbar AROM without report of pain or tightness in order to normalize movement patterns and reduce functional limitations Baseline: patient reports tightness with extension, left side bend and rotation Goal status: INITIAL   PLAN: PT FREQUENCY: every other week  PT DURATION: 8 weeks  PLANNED INTERVENTIONS: 97164- PT Re-evaluation, 97750- Physical Performance Testing, 97110-Therapeutic exercises, 97530- Therapeutic activity, 97112- Neuromuscular re-education, 97535- Self Care, 02859- Manual therapy, 20560 (1-2 muscles), 20561 (3+ muscles)- Dry Needling, Patient/Family education, Joint mobilization, Joint manipulation, Spinal manipulation, Spinal mobilization, Cryotherapy, and Moist heat.  PLAN FOR NEXT SESSION: Review HEP and progress PRN, continue focus on glute strengthening and core stabilization for lumbopelvic control, lifting/hip hinge mechanics and initiate lifting progression, lumbar/hip mobility and hip flexor/QL stretching, manual/TPDN as indicated   Elaine Daring,  PT, DPT, LAT, ATC 03/27/24  11:42 AM Phone: [phone] Fax: [phone]

## 2024-03-27 NOTE — Patient Instructions (Signed)
 Access Code: [email or web address] URL: [email or web address] Date: 03/27/2024 Prepared by: Elaine Daring  Exercises - Clam with Resistance  - 3 sets - 10 reps - Single Leg Bridge  - 3 sets - 6 reps - 5seconds hold - Supine 90/90 Alternating Heel Touches with Posterior Pelvic Tilt  - 3 sets - 10 reps - Half Kneeling Hip Flexor Stretch  - 3 reps - 30 seconds hold

## 2024-03-28 ENCOUNTER — Other Ambulatory Visit (HOSPITAL_COMMUNITY): Payer: Self-pay

## 2024-03-30 ENCOUNTER — Other Ambulatory Visit (HOSPITAL_COMMUNITY): Payer: Self-pay

## 2024-03-30 ENCOUNTER — Other Ambulatory Visit: Payer: Self-pay

## 2024-03-31 ENCOUNTER — Telehealth: Payer: Self-pay

## 2024-03-31 ENCOUNTER — Other Ambulatory Visit (HOSPITAL_COMMUNITY): Payer: Self-pay

## 2024-03-31 ENCOUNTER — Other Ambulatory Visit: Payer: Self-pay

## 2024-03-31 DIAGNOSIS — G4733 Obstructive sleep apnea (adult) (pediatric): Secondary | ICD-10-CM

## 2024-03-31 NOTE — Telephone Encounter (Signed)
 Copied from CRM [id number]. Topic: Clinical - Prescription Issue >> Mar 31, 2024  1:47 PM Berneda FALCON wrote: Reason for CRM: Arvella from [facility] that the prescription came to them but they only refill the vials, not the pens.  Can we please resend?  Medication:  tirzepatide  (ZEPBOUND ) 15 MG/0.5ML Pen  [facility] [place], [place] - [phone] [place] [phone] Phone: [phone] Fax: [phone] Hours: Not open 24 hours

## 2024-04-01 ENCOUNTER — Other Ambulatory Visit (HOSPITAL_COMMUNITY): Payer: Self-pay

## 2024-04-01 MED ORDER — LISDEXAMFETAMINE DIMESYLATE 20 MG PO CAPS
20.0000 mg | ORAL_CAPSULE | Freq: Every day | ORAL | 0 refills | Status: DC
  Filled 2024-04-22 – 2024-04-23 (×2): qty 30, 30d supply, fill #0

## 2024-04-01 NOTE — Telephone Encounter (Signed)
 I have changed this in pts med list. It is pending for you to send.

## 2024-04-01 NOTE — Addendum Note (Signed)
 Addended by: Champagne Paletta on: 04/01/2024 07:45 AM   Modules accepted: Orders

## 2024-04-03 ENCOUNTER — Other Ambulatory Visit (HOSPITAL_COMMUNITY): Payer: Self-pay

## 2024-04-04 DIAGNOSIS — G4733 Obstructive sleep apnea (adult) (pediatric): Secondary | ICD-10-CM | POA: Diagnosis not present

## 2024-04-06 MED ORDER — ZEPBOUND 15 MG/0.5ML ~~LOC~~ SOLN: 15.0000 mg | SUBCUTANEOUS | 3 refills | Status: AC

## 2024-04-06 NOTE — Addendum Note (Signed)
 Addended by: PRENTISS FRIEZE R on: 04/06/2024 07:50 AM   Modules accepted: Orders

## 2024-04-16 ENCOUNTER — Other Ambulatory Visit (HOSPITAL_COMMUNITY): Payer: Self-pay

## 2024-04-17 ENCOUNTER — Ambulatory Visit: Admitting: Family Medicine

## 2024-04-17 ENCOUNTER — Encounter: Payer: Self-pay | Admitting: Family Medicine

## 2024-04-17 VITALS — BP 126/82 | HR 62 | Ht 72.0 in | Wt 250.0 lb

## 2024-04-17 DIAGNOSIS — G4733 Obstructive sleep apnea (adult) (pediatric): Secondary | ICD-10-CM

## 2024-04-17 DIAGNOSIS — E66811 Obesity, class 1: Secondary | ICD-10-CM | POA: Diagnosis not present

## 2024-04-17 DIAGNOSIS — Z981 Arthrodesis status: Secondary | ICD-10-CM

## 2024-04-17 DIAGNOSIS — E782 Mixed hyperlipidemia: Secondary | ICD-10-CM

## 2024-04-17 DIAGNOSIS — K760 Fatty (change of) liver, not elsewhere classified: Secondary | ICD-10-CM | POA: Diagnosis not present

## 2024-04-17 DIAGNOSIS — Z6833 Body mass index (BMI) 33.0-33.9, adult: Secondary | ICD-10-CM | POA: Diagnosis not present

## 2024-04-17 DIAGNOSIS — Z1589 Genetic susceptibility to other disease: Secondary | ICD-10-CM | POA: Diagnosis not present

## 2024-04-17 DIAGNOSIS — R632 Polyphagia: Secondary | ICD-10-CM | POA: Diagnosis not present

## 2024-04-19 ENCOUNTER — Encounter: Payer: Self-pay | Admitting: Family Medicine

## 2024-04-19 DIAGNOSIS — R632 Polyphagia: Secondary | ICD-10-CM | POA: Insufficient documentation

## 2024-04-19 DIAGNOSIS — Z9189 Other specified personal risk factors, not elsewhere classified: Secondary | ICD-10-CM | POA: Insufficient documentation

## 2024-04-19 DIAGNOSIS — Z1589 Genetic susceptibility to other disease: Secondary | ICD-10-CM | POA: Insufficient documentation

## 2024-04-19 DIAGNOSIS — L209 Atopic dermatitis, unspecified: Secondary | ICD-10-CM | POA: Insufficient documentation

## 2024-04-19 DIAGNOSIS — Z6833 Body mass index (BMI) 33.0-33.9, adult: Secondary | ICD-10-CM | POA: Insufficient documentation

## 2024-04-19 DIAGNOSIS — J309 Allergic rhinitis, unspecified: Secondary | ICD-10-CM | POA: Insufficient documentation

## 2024-04-19 NOTE — Progress Notes (Signed)
 Assessment & Plan:   1. Polyphagia (Primary) Comments: Improved with Zepbound .  2. Metabolic dysfunction-associated steatotic liver disease (MASLD)  3. Mixed hyperlipidemia Comments: Continue Crestor .  4. S/P lumbar fusion Comments: Reports feeling good. Exercise is aerobic.  5. OSA (obstructive sleep apnea) Comments: CPAP compliant.  6. MTHFR gene mutation Comments: OTC methylated B-complex (methylfolate 15 mg + methyl-B12 1,000 mcg daily).  7. Class 1 obesity with serious comorbidity and body mass index (BMI) of 33.0 to 33.9 in adult, unspecified obesity type Comments: BMI trend: Jan-May 2025: 36-36.9 (Class II obesity), Jul 2025: 32.8 (Class I obesity). Rx Zepbound . Improved appetite control.  Geni Shutter, DO, MS, FAAFP, Dipl. [facility] [address] [place], [address] Dept: [phone] Dept Fax: [phone]  Subjective:   Review of Systems: Negative, with the exception of above mentioned in HPI.  Medications:   Show/hide medication list[1]   Objective:   BP 126/82 (BP Location: Right Arm)   Pulse 62   Ht 6' (1.829 m)   Wt 250 lb (113.4 kg)   SpO2 98%   BMI 33.91 kg/m   Physical Exam Constitutional:      General: He is not in acute distress.    Appearance: He is well-developed.  HENT:     Head: Normocephalic and atraumatic.  Eyes:     Conjunctiva/sclera: Conjunctivae normal.  Cardiovascular:     Rate and Rhythm: Normal rate and regular rhythm.     Heart sounds: Normal heart sounds.  Pulmonary:     Effort: Pulmonary effort is normal.     Breath sounds: Normal breath sounds.  Neurological:     General: No focal deficit present.     Mental Status: He is alert.  Psychiatric:        Behavior: Behavior normal.    Results for orders placed or performed in visit on 12/20/23  VITAMIN D  25 Hydroxy (Vit-D Deficiency, Fractures)   Collection Time: 12/20/23  8:21 AM  Result Value Ref Range    VITD 33.87 30.00 - 100.00 ng/mL  TSH   Collection Time: 12/20/23  8:21 AM  Result Value Ref Range   TSH 1.57 0.35 - 5.50 uIU/mL  Lipid panel   Collection Time: 12/20/23  8:21 AM  Result Value Ref Range   Cholesterol 121 0 - 200 mg/dL   Triglycerides 04.9 0.0 - 149.0 mg/dL   HDL 52.99 >60.99 mg/dL   VLDL 80.9 0.0 - 59.9 mg/dL   LDL Cholesterol 55 0 - 99 mg/dL   Total CHOL/HDL Ratio 3    NonHDL 74.03   Comprehensive metabolic panel with GFR   Collection Time: 12/20/23  8:21 AM  Result Value Ref Range   Sodium 133 (L) 135 - 145 mEq/L   Potassium 3.9 3.5 - 5.1 mEq/L   Chloride 99 96 - 112 mEq/L   CO2 26 19 - 32 mEq/L   Glucose, Bld 84 70 - 99 mg/dL   BUN 14 6 - 23 mg/dL   Creatinine, Ser 9.12 0.40 - 1.50 mg/dL   Total Bilirubin 1.0 0.2 - 1.2 mg/dL   Alkaline Phosphatase 76 39 - 117 U/L   AST 23 0 - 37 U/L   ALT 35 0 - 53 U/L   Total Protein 7.6 6.0 - 8.3 g/dL   Albumin 4.5 3.5 - 5.2 g/dL   GFR 894.30 >39.99 mL/min   Calcium  9.4 8.4 - 10.5 mg/dL  CBC with Differential/Platelet   Collection Time: 12/20/23  8:21  AM  Result Value Ref Range   WBC 6.4 4.0 - 10.5 K/uL   RBC 4.76 4.22 - 5.81 Mil/uL   Hemoglobin 14.4 13.0 - 17.0 g/dL   HCT 59.0 60.9 - 47.9 %   MCV 85.8 78.0 - 100.0 fl   MCHC 35.1 30.0 - 36.0 g/dL   RDW 86.4 88.4 - 84.4 %   Platelets 261.0 150.0 - 400.0 K/uL   Neutrophils Relative % 54.4 43.0 - 77.0 %   Lymphocytes Relative 36.8 12.0 - 46.0 %   Monocytes Relative 6.7 3.0 - 12.0 %   Eosinophils Relative 1.6 0.0 - 5.0 %   Basophils Relative 0.5 0.0 - 3.0 %   Neutro Abs 3.5 1.4 - 7.7 K/uL   Lymphs Abs 2.4 0.7 - 4.0 K/uL   Monocytes Absolute 0.4 0.1 - 1.0 K/uL   Eosinophils Absolute 0.1 0.0 - 0.7 K/uL   Basophils Absolute 0.0 0.0 - 0.1 K/uL    Attestations:   Available records reviewed., Chart updated today with reconciliation of problem list, medications, allergies, and relevant history. Preventive care and chronic disease status reviewed. , and Portions of  historical chart may remain incomplete; will update on an ongoing basis as clinically indicated.     [1]  Outpatient Medications Prior to Visit  Medication Sig   betamethasone  valerate (VALISONE ) 0.1 % cream Apply 1 Application topically daily.   celecoxib  (CELEBREX ) 200 MG capsule Take 1 capsule (200 mg total) by mouth 2 (two) times daily.   cetirizine (ZYRTEC) 10 MG tablet Take 10 mg by mouth daily.   desonide  (DESOWEN ) 0.05 % ointment Apply 1 Application topically to eyelids  (two) times daily for 7 days.   Febuxostat  80 MG TABS Take 1 tablet (80 mg total) by mouth daily.   FLUoxetine  (PROZAC ) 20 MG capsule Take 1 capsule (20 mg total) by mouth daily.   gabapentin  (NEURONTIN ) 300 MG capsule Take 1 capsule (300 mg total) by mouth at bedtime.   hydrochlorothiazide  (HYDRODIURIL ) 25 MG tablet Take 1 tablet (25 mg total) by mouth daily.   lisdexamfetamine  (VYVANSE ) 20 MG capsule Take 1 capsule (20 mg total) by mouth daily.   lisinopril  (ZESTRIL ) 10 MG tablet Take 1 tablet (10 mg total) by mouth daily.   pantoprazole  (PROTONIX ) 40 MG tablet Take 1 tablet by mouth daily.   rosuvastatin  (CRESTOR ) 5 MG tablet Take 1 tablet (5 mg total) by mouth daily.   Tirzepatide -Weight Management (ZEPBOUND ) 15 MG/0.5ML SOLN Inject 15 mg into the skin once a week.   traZODone  (DESYREL ) 50 MG tablet Take 1 to 2 tablets by mouth at bedtime.   Vitamin D , Ergocalciferol , (DRISDOL ) 1.25 MG (50000 UNIT) CAPS capsule Take 1 capsule (50,000 Units total) by mouth once a week.   [DISCONTINUED] acetaminophen  (TYLENOL ) 650 MG CR tablet Take 1,300 mg by mouth every 8 (eight) hours as needed for pain.   No facility-administered medications prior to visit.

## 2024-04-22 ENCOUNTER — Other Ambulatory Visit (HOSPITAL_COMMUNITY): Payer: Self-pay

## 2024-04-22 ENCOUNTER — Other Ambulatory Visit: Payer: Self-pay

## 2024-04-23 ENCOUNTER — Other Ambulatory Visit: Payer: Self-pay

## 2024-04-23 ENCOUNTER — Other Ambulatory Visit (HOSPITAL_COMMUNITY): Payer: Self-pay

## 2024-05-16 ENCOUNTER — Other Ambulatory Visit (HOSPITAL_COMMUNITY): Payer: Self-pay

## 2024-05-18 ENCOUNTER — Other Ambulatory Visit: Payer: Self-pay

## 2024-05-18 ENCOUNTER — Other Ambulatory Visit (HOSPITAL_COMMUNITY): Payer: Self-pay

## 2024-05-20 ENCOUNTER — Other Ambulatory Visit: Payer: Self-pay

## 2024-05-20 ENCOUNTER — Other Ambulatory Visit (HOSPITAL_COMMUNITY): Payer: Self-pay

## 2024-05-22 ENCOUNTER — Other Ambulatory Visit (HOSPITAL_COMMUNITY): Payer: Self-pay

## 2024-05-22 MED ORDER — LISDEXAMFETAMINE DIMESYLATE 20 MG PO CAPS
20.0000 mg | ORAL_CAPSULE | Freq: Every day | ORAL | 0 refills | Status: AC
  Filled 2024-05-22: qty 30, 30d supply, fill #0

## 2024-05-29 ENCOUNTER — Other Ambulatory Visit (HOSPITAL_COMMUNITY): Payer: Self-pay

## 2024-07-17 ENCOUNTER — Ambulatory Visit: Admitting: Family Medicine

## 2024-07-17 ENCOUNTER — Ambulatory Visit: Admitting: Internal Medicine
